# Patient Record
Sex: Male | Born: 1945 | Race: White | Hispanic: No | Marital: Married | Smoking: Never smoker
Health system: Southern US, Community
[De-identification: ages and names within clinical notes are randomized; demographics above are authoritative.]

## PROBLEM LIST (undated history)

## (undated) DIAGNOSIS — I472 Ventricular tachycardia, unspecified: Secondary | ICD-10-CM

## (undated) DIAGNOSIS — I2699 Other pulmonary embolism without acute cor pulmonale: Secondary | ICD-10-CM

## (undated) DIAGNOSIS — K5730 Diverticulosis of large intestine without perforation or abscess without bleeding: Secondary | ICD-10-CM

## (undated) DIAGNOSIS — Z95810 Presence of automatic (implantable) cardiac defibrillator: Secondary | ICD-10-CM

## (undated) DIAGNOSIS — Z950 Presence of cardiac pacemaker: Secondary | ICD-10-CM

## (undated) DIAGNOSIS — D649 Anemia, unspecified: Secondary | ICD-10-CM

## (undated) DIAGNOSIS — I499 Cardiac arrhythmia, unspecified: Secondary | ICD-10-CM

## (undated) DIAGNOSIS — I071 Rheumatic tricuspid insufficiency: Secondary | ICD-10-CM

## (undated) DIAGNOSIS — Z8739 Personal history of other diseases of the musculoskeletal system and connective tissue: Secondary | ICD-10-CM

## (undated) DIAGNOSIS — E785 Hyperlipidemia, unspecified: Secondary | ICD-10-CM

## (undated) DIAGNOSIS — Z978 Presence of other specified devices: Secondary | ICD-10-CM

## (undated) DIAGNOSIS — N183 Chronic kidney disease, stage 3 unspecified: Secondary | ICD-10-CM

## (undated) DIAGNOSIS — I442 Atrioventricular block, complete: Secondary | ICD-10-CM

## (undated) DIAGNOSIS — I4819 Other persistent atrial fibrillation: Secondary | ICD-10-CM

## (undated) DIAGNOSIS — F0280 Dementia in other diseases classified elsewhere without behavioral disturbance: Secondary | ICD-10-CM

## (undated) DIAGNOSIS — N138 Other obstructive and reflux uropathy: Secondary | ICD-10-CM

## (undated) DIAGNOSIS — C801 Malignant (primary) neoplasm, unspecified: Secondary | ICD-10-CM

## (undated) DIAGNOSIS — Z7901 Long term (current) use of anticoagulants: Secondary | ICD-10-CM

## (undated) HISTORY — DX: Diverticulosis of large intestine without perforation or abscess without bleeding: K57.30

## (undated) HISTORY — DX: Other pulmonary embolism without acute cor pulmonale: I26.99

## (undated) HISTORY — DX: Hyperlipidemia, unspecified: E78.5

## (undated) HISTORY — DX: Presence of automatic (implantable) cardiac defibrillator: Z95.810

## (undated) HISTORY — DX: Ventricular tachycardia: I47.2

## (undated) HISTORY — DX: Rheumatic tricuspid insufficiency: I07.1

## (undated) HISTORY — DX: Ventricular tachycardia, unspecified: I47.20

---
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
## 2004-01-23 ENCOUNTER — Encounter: Admission: RE | Admit: 2004-01-23 | Discharge: 2004-01-23 | Payer: Self-pay | Admitting: Specialist

## 2004-03-03 ENCOUNTER — Ambulatory Visit (HOSPITAL_COMMUNITY): Admission: RE | Admit: 2004-03-03 | Discharge: 2004-03-03 | Payer: Self-pay | Admitting: Specialist

## 2009-11-21 DIAGNOSIS — Z86711 Personal history of pulmonary embolism: Secondary | ICD-10-CM

## 2009-11-21 HISTORY — DX: Personal history of pulmonary embolism: Z86.711

## 2010-08-10 DIAGNOSIS — E785 Hyperlipidemia, unspecified: Secondary | ICD-10-CM | POA: Insufficient documentation

## 2010-11-21 HISTORY — PX: CARDIOVERSION: SHX1299

## 2010-12-12 ENCOUNTER — Encounter: Payer: Self-pay | Admitting: Specialist

## 2011-01-20 DIAGNOSIS — I495 Sick sinus syndrome: Secondary | ICD-10-CM

## 2011-01-20 HISTORY — DX: Sick sinus syndrome: I49.5

## 2011-01-20 HISTORY — PX: CARDIAC PACEMAKER PLACEMENT: SHX583

## 2011-01-31 HISTORY — PX: CARDIAC CATHETERIZATION: SHX172

## 2011-03-22 DIAGNOSIS — Z95810 Presence of automatic (implantable) cardiac defibrillator: Secondary | ICD-10-CM

## 2011-03-22 DIAGNOSIS — I4729 Other ventricular tachycardia: Secondary | ICD-10-CM

## 2011-03-22 HISTORY — DX: Other ventricular tachycardia: I47.29

## 2011-03-22 HISTORY — DX: Presence of automatic (implantable) cardiac defibrillator: Z95.810

## 2011-04-04 HISTORY — PX: EP IMPLANTABLE DEVICE: SHX172B

## 2011-04-13 DIAGNOSIS — I340 Nonrheumatic mitral (valve) insufficiency: Secondary | ICD-10-CM | POA: Insufficient documentation

## 2011-04-13 DIAGNOSIS — I495 Sick sinus syndrome: Secondary | ICD-10-CM | POA: Insufficient documentation

## 2011-04-13 DIAGNOSIS — I472 Ventricular tachycardia, unspecified: Secondary | ICD-10-CM | POA: Insufficient documentation

## 2011-05-17 DIAGNOSIS — Z95810 Presence of automatic (implantable) cardiac defibrillator: Secondary | ICD-10-CM | POA: Insufficient documentation

## 2011-05-17 DIAGNOSIS — I442 Atrioventricular block, complete: Secondary | ICD-10-CM | POA: Insufficient documentation

## 2013-05-01 DIAGNOSIS — Z86711 Personal history of pulmonary embolism: Secondary | ICD-10-CM | POA: Insufficient documentation

## 2013-09-23 ENCOUNTER — Telehealth: Payer: Self-pay | Admitting: *Deleted

## 2013-09-23 DIAGNOSIS — M722 Plantar fascial fibromatosis: Secondary | ICD-10-CM

## 2013-09-23 NOTE — Telephone Encounter (Signed)
Request to order second pair of orthotics.

## 2013-09-24 NOTE — Telephone Encounter (Signed)
Ordered pt 2nd pair of orthotics. Will call when they arrive.

## 2014-02-25 DIAGNOSIS — K5730 Diverticulosis of large intestine without perforation or abscess without bleeding: Secondary | ICD-10-CM | POA: Insufficient documentation

## 2014-05-27 DIAGNOSIS — G3184 Mild cognitive impairment, so stated: Secondary | ICD-10-CM | POA: Insufficient documentation

## 2014-06-03 DIAGNOSIS — I482 Chronic atrial fibrillation, unspecified: Secondary | ICD-10-CM | POA: Insufficient documentation

## 2014-09-12 DIAGNOSIS — M720 Palmar fascial fibromatosis [Dupuytren]: Secondary | ICD-10-CM | POA: Insufficient documentation

## 2014-10-21 ENCOUNTER — Telehealth: Payer: Self-pay | Admitting: *Deleted

## 2014-10-21 NOTE — Telephone Encounter (Signed)
Pt asked if his 2nd pr of orthotics have arrived.

## 2014-10-22 NOTE — Telephone Encounter (Signed)
Placed 2nd pair order with [facility] with [facility] delivery

## 2014-11-10 DIAGNOSIS — M722 Plantar fascial fibromatosis: Secondary | ICD-10-CM

## 2015-04-14 ENCOUNTER — Ambulatory Visit: Payer: Medicare Other | Admitting: Podiatry

## 2015-04-14 ENCOUNTER — Ambulatory Visit (INDEPENDENT_AMBULATORY_CARE_PROVIDER_SITE_OTHER): Payer: Managed Care, Other (non HMO) | Admitting: Podiatry

## 2015-04-14 ENCOUNTER — Encounter: Payer: Self-pay | Admitting: Podiatry

## 2015-04-14 ENCOUNTER — Ambulatory Visit (INDEPENDENT_AMBULATORY_CARE_PROVIDER_SITE_OTHER): Payer: Managed Care, Other (non HMO)

## 2015-04-14 VITALS — BP 133/66 | HR 60 | Resp 12

## 2015-04-14 DIAGNOSIS — M2042 Other hammer toe(s) (acquired), left foot: Secondary | ICD-10-CM

## 2015-04-14 DIAGNOSIS — M779 Enthesopathy, unspecified: Secondary | ICD-10-CM

## 2015-04-14 NOTE — Progress Notes (Signed)
   Subjective:    Patient ID: Eric Krueger, male    DOB: [birth year], 69 y.o.   MRN: [id number]  HPI  PT STATED LT BALL OF THE FOOT HAVING SHARP FOR 3 WEEKS. THE FOOT IS BEEN THE SAME BUT WHEN WORSE IN THE MORNING AND THROUGH OUT THE DAY. THE FOOT GET AGGRAVATED WHEN WALKING OR PUTTING PRESSURE ON IT. TRIED NO TREATMENT.  Review of Systems  Musculoskeletal: Positive for myalgias.       Objective:   Physical Exam: I have reviewed his past medical history medications allergies surgery social history and review of systems. Pulses are strongly palpable bilateral. Muscles are +5 over 5 dorsiflexion plantar flexors and inverters and everters all into the musculature is intact. 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 Radiographs confirm plantarflexed metatarsal with hammertoe deformity rigid in nature.        Assessment & Plan:  Assessment: Capsulitis second metatarsophalangeal joint left foot.  Plan: Injected around the second metatarsophalangeal joint with Kenalog and local and aesthetic after sterile Betadine skin prep. Follow up with him as needed. We discussed appropriate shoe gear stretching exercises ice therapy and shoe modifications.

## 2015-11-10 HISTORY — PX: ICD GENERATOR CHANGE: SHX5854

## 2015-11-27 DIAGNOSIS — R7309 Other abnormal glucose: Secondary | ICD-10-CM | POA: Insufficient documentation

## 2016-06-15 DIAGNOSIS — N401 Enlarged prostate with lower urinary tract symptoms: Secondary | ICD-10-CM | POA: Insufficient documentation

## 2016-06-29 DIAGNOSIS — Z1159 Encounter for screening for other viral diseases: Secondary | ICD-10-CM | POA: Insufficient documentation

## 2017-12-22 DIAGNOSIS — G894 Chronic pain syndrome: Secondary | ICD-10-CM | POA: Insufficient documentation

## 2018-04-09 DIAGNOSIS — R600 Localized edema: Secondary | ICD-10-CM | POA: Insufficient documentation

## 2018-04-10 DIAGNOSIS — Z8739 Personal history of other diseases of the musculoskeletal system and connective tissue: Secondary | ICD-10-CM | POA: Insufficient documentation

## 2018-06-27 DIAGNOSIS — M461 Sacroiliitis, not elsewhere classified: Secondary | ICD-10-CM | POA: Insufficient documentation

## 2018-06-27 DIAGNOSIS — M47816 Spondylosis without myelopathy or radiculopathy, lumbar region: Secondary | ICD-10-CM | POA: Insufficient documentation

## 2019-01-01 ENCOUNTER — Encounter: Payer: Self-pay | Admitting: Podiatry

## 2019-01-01 ENCOUNTER — Ambulatory Visit (INDEPENDENT_AMBULATORY_CARE_PROVIDER_SITE_OTHER): Payer: Medicare Other | Admitting: Podiatry

## 2019-01-01 ENCOUNTER — Ambulatory Visit: Payer: Managed Care, Other (non HMO)

## 2019-01-01 VITALS — BP 111/74 | HR 92 | Resp 16

## 2019-01-01 DIAGNOSIS — M779 Enthesopathy, unspecified: Principal | ICD-10-CM

## 2019-01-01 DIAGNOSIS — L600 Ingrowing nail: Secondary | ICD-10-CM

## 2019-01-01 DIAGNOSIS — M5136 Other intervertebral disc degeneration, lumbar region: Secondary | ICD-10-CM | POA: Insufficient documentation

## 2019-01-01 DIAGNOSIS — M778 Other enthesopathies, not elsewhere classified: Secondary | ICD-10-CM

## 2019-01-01 MED ORDER — NEOMYCIN-POLYMYXIN-HC 1 % OT SOLN: OTIC | 1 refills | Status: DC

## 2019-01-01 NOTE — Progress Notes (Signed)
Subjective:  Patient ID: Eric Krueger, male    DOB: 24-Aug-1946,  MRN: [id number] HPI Chief Complaint  Patient presents with  . Toe Pain    Hallux left - lateral border, injury to toe and nail has become ingrown, tender, red and swollen, using antibiotic ointment and bandaid  . New Patient (Initial Visit)    Est pt 2016    73 y.o. male presents with the above complaint.   ROS: Denies fever chills nausea vomiting muscle aches pains calf pain back pain chest pain shortness of breath.  Past Medical History:  Diagnosis Date  . Dual implantable cardioverter-defibrillator in situ   . Dyslipidemia   . Pulmonary embolism ([place])   . Sigmoid diverticulosis   . Tricuspid regurgitation   . Ventricular tachycardia ([place])    No past surgical history on file.  Current Outpatient Medications:  .  acetaminophen (TYLENOL) 500 MG tablet, Take by mouth., Disp: , Rfl:  .  cyanocobalamin ([employer] VITAMIN B12) 1000 MCG tablet, Take by mouth., Disp: , Rfl:  .  gabapentin (NEURONTIN) 300 MG capsule, TAKE 1 TO 2 CAPSULES EVERY EVENING BEFORE BEDTIME., Disp: , Rfl:  .  atorvastatin (LIPITOR) 10 MG tablet, , Disp: , Rfl:  .  ELIQUIS 5 MG TABS tablet, , Disp: , Rfl: 11 .  fenofibrate (TRICOR) 145 MG tablet, , Disp: , Rfl:  .  NEOMYCIN-POLYMYXIN-HYDROCORTISONE (CORTISPORIN) 1 % SOLN OTIC solution, Apply 1-2 drops to toe BID after soaking, Disp: 10 mL, Rfl: 1 .  sotalol (BETAPACE) 160 MG tablet, , Disp: , Rfl: 0  Allergies  Allergen Reactions  . Other     Other reaction(s): Redness  . Adhesive [Tape]   . Aspirin   . Ezetimibe-Simvastatin     myalgias  . Latex   . Nsaids     Other reaction(s): Other (See Comments) Cannot take d/t defibrillator/pacemaker  . Saw Palmetto Diarrhea   Review of Systems Objective:   Vitals:   01/01/19 1118  BP: 111/74  Pulse: 92  Resp: 16    General: Well developed, nourished, in no acute distress, alert and oriented x3   Dermatological: Skin is warm, dry and  supple bilateral. Nails x 10 are well maintained; remaining integument appears unremarkable at this time. There are no open sores, no preulcerative lesions, no rash or signs of infection present.  Sharp incurvated nail margin fibular border hallux left with exquisite tenderness on palpation.  Mild erythema mild edema no cellulitis drainage or odor.  Vascular: Dorsalis Pedis artery and Posterior Tibial artery pedal pulses are 2/4 bilateral with immedate capillary fill time. Pedal hair growth present. No varicosities and no lower extremity edema present bilateral.   Neruologic: Grossly intact via light touch bilateral. Vibratory intact via tuning fork bilateral. Protective threshold with Semmes Wienstein monofilament intact to all pedal sites bilateral. Patellar and Achilles deep tendon reflexes 2+ bilateral. No Babinski or clonus noted bilateral.   Musculoskeletal: No gross boney pedal deformities bilateral. No pain, crepitus, or limitation noted with foot and ankle range of motion bilateral. Muscular strength 5/5 in all groups tested bilateral.  Gait: Unassisted, Nonantalgic.    Radiographs:  None taken  Assessment & Plan:   Assessment: Ingrown toenail fibular border hallux left  Plan: Chemical matricectomy was performed after local anesthesia was administered.  Tolerated procedure well without complications.  Was provided with both oral and written home-going instructions for the care and soaking of his toe as well as a prescription for Corticosporin otic to be applied  twice daily after soaking.      T. [place], [place]

## 2019-01-01 NOTE — Patient Instructions (Signed)

## 2019-01-22 ENCOUNTER — Ambulatory Visit (INDEPENDENT_AMBULATORY_CARE_PROVIDER_SITE_OTHER): Payer: Self-pay

## 2019-01-22 ENCOUNTER — Other Ambulatory Visit: Payer: Medicare Other

## 2019-01-22 DIAGNOSIS — L600 Ingrowing nail: Secondary | ICD-10-CM

## 2019-01-22 NOTE — Patient Instructions (Signed)
Please continue to soak until there is no pain, no redness and no drainage. Bandage on during the day and off at night. Report any signs of symptoms of infection, examples are: increased pain, increased swelling or drainage, increased redness. Please call with any questions or concerns

## 2019-01-22 NOTE — Progress Notes (Signed)
Patient is here today for follow-up appointment, recent procedure performed 01/01/2019, removal of ingrown left hallux nail border.  Patient states that the area is healing well and is not having any issues at this time with it.  No redness, no swelling, no erythema, no drainage, no other signs and symptoms of infection.  Area is scabbed over and is healing well at this time.  Discussed signs and symptoms of infection with patient verbal and written instructions were given.  He is to follow-up as needed with any acute symptom changes.

## 2019-11-22 HISTORY — PX: CATARACT EXTRACTION W/ INTRAOCULAR LENS IMPLANT: SHX1309

## 2021-06-21 DIAGNOSIS — R339 Retention of urine, unspecified: Secondary | ICD-10-CM

## 2021-06-21 HISTORY — DX: Retention of urine, unspecified: R33.9

## 2021-07-04 ENCOUNTER — Inpatient Hospital Stay (HOSPITAL_COMMUNITY): Payer: Medicare Other

## 2021-07-04 ENCOUNTER — Inpatient Hospital Stay (HOSPITAL_COMMUNITY)
Admission: EM | Admit: 2021-07-04 | Discharge: 2021-07-08 | DRG: 682 | Disposition: A | Discharge status: Home Health | Payer: Medicare Other | Attending: Internal Medicine | Admitting: Internal Medicine

## 2021-07-04 ENCOUNTER — Other Ambulatory Visit: Payer: Self-pay

## 2021-07-04 ENCOUNTER — Emergency Department (HOSPITAL_COMMUNITY): Payer: Medicare Other

## 2021-07-04 ENCOUNTER — Encounter (HOSPITAL_COMMUNITY): Payer: Self-pay | Admitting: Emergency Medicine

## 2021-07-04 DIAGNOSIS — Z7901 Long term (current) use of anticoagulants: Secondary | ICD-10-CM

## 2021-07-04 DIAGNOSIS — I071 Rheumatic tricuspid insufficiency: Secondary | ICD-10-CM | POA: Diagnosis present

## 2021-07-04 DIAGNOSIS — G9341 Metabolic encephalopathy: Secondary | ICD-10-CM | POA: Diagnosis present

## 2021-07-04 DIAGNOSIS — N1330 Unspecified hydronephrosis: Secondary | ICD-10-CM | POA: Diagnosis present

## 2021-07-04 DIAGNOSIS — Z20822 Contact with and (suspected) exposure to covid-19: Secondary | ICD-10-CM | POA: Diagnosis present

## 2021-07-04 DIAGNOSIS — N138 Other obstructive and reflux uropathy: Secondary | ICD-10-CM | POA: Diagnosis present

## 2021-07-04 DIAGNOSIS — N19 Unspecified kidney failure: Secondary | ICD-10-CM

## 2021-07-04 DIAGNOSIS — N179 Acute kidney failure, unspecified: Secondary | ICD-10-CM | POA: Diagnosis present

## 2021-07-04 DIAGNOSIS — Z833 Family history of diabetes mellitus: Secondary | ICD-10-CM

## 2021-07-04 DIAGNOSIS — N1831 Chronic kidney disease, stage 3a: Secondary | ICD-10-CM | POA: Diagnosis present

## 2021-07-04 DIAGNOSIS — E872 Acidosis: Secondary | ICD-10-CM | POA: Diagnosis present

## 2021-07-04 DIAGNOSIS — I472 Ventricular tachycardia: Secondary | ICD-10-CM | POA: Diagnosis present

## 2021-07-04 DIAGNOSIS — I495 Sick sinus syndrome: Secondary | ICD-10-CM | POA: Diagnosis present

## 2021-07-04 DIAGNOSIS — R680 Hypothermia, not associated with low environmental temperature: Secondary | ICD-10-CM | POA: Diagnosis present

## 2021-07-04 DIAGNOSIS — Z86711 Personal history of pulmonary embolism: Secondary | ICD-10-CM

## 2021-07-04 DIAGNOSIS — E876 Hypokalemia: Secondary | ICD-10-CM | POA: Diagnosis present

## 2021-07-04 DIAGNOSIS — R319 Hematuria, unspecified: Secondary | ICD-10-CM | POA: Diagnosis not present

## 2021-07-04 DIAGNOSIS — R3581 Nocturnal polyuria: Secondary | ICD-10-CM | POA: Diagnosis not present

## 2021-07-04 DIAGNOSIS — I482 Chronic atrial fibrillation, unspecified: Secondary | ICD-10-CM | POA: Diagnosis present

## 2021-07-04 DIAGNOSIS — N178 Other acute kidney failure: Secondary | ICD-10-CM | POA: Diagnosis not present

## 2021-07-04 DIAGNOSIS — E785 Hyperlipidemia, unspecified: Secondary | ICD-10-CM | POA: Diagnosis present

## 2021-07-04 DIAGNOSIS — R627 Adult failure to thrive: Secondary | ICD-10-CM | POA: Diagnosis present

## 2021-07-04 DIAGNOSIS — Z95810 Presence of automatic (implantable) cardiac defibrillator: Secondary | ICD-10-CM

## 2021-07-04 DIAGNOSIS — N401 Enlarged prostate with lower urinary tract symptoms: Secondary | ICD-10-CM | POA: Diagnosis present

## 2021-07-04 DIAGNOSIS — N3941 Urge incontinence: Secondary | ICD-10-CM | POA: Diagnosis present

## 2021-07-04 DIAGNOSIS — E875 Hyperkalemia: Secondary | ICD-10-CM | POA: Diagnosis present

## 2021-07-04 DIAGNOSIS — T68XXXA Hypothermia, initial encounter: Secondary | ICD-10-CM | POA: Diagnosis not present

## 2021-07-04 DIAGNOSIS — Z7189 Other specified counseling: Secondary | ICD-10-CM

## 2021-07-04 DIAGNOSIS — G309 Alzheimer's disease, unspecified: Secondary | ICD-10-CM | POA: Diagnosis present

## 2021-07-04 DIAGNOSIS — D631 Anemia in chronic kidney disease: Secondary | ICD-10-CM | POA: Diagnosis present

## 2021-07-04 DIAGNOSIS — E870 Hyperosmolality and hypernatremia: Secondary | ICD-10-CM | POA: Diagnosis not present

## 2021-07-04 DIAGNOSIS — I959 Hypotension, unspecified: Secondary | ICD-10-CM | POA: Diagnosis present

## 2021-07-04 DIAGNOSIS — R296 Repeated falls: Secondary | ICD-10-CM | POA: Diagnosis present

## 2021-07-04 DIAGNOSIS — F0280 Dementia in other diseases classified elsewhere without behavioral disturbance: Secondary | ICD-10-CM | POA: Diagnosis present

## 2021-07-04 DIAGNOSIS — I442 Atrioventricular block, complete: Secondary | ICD-10-CM | POA: Diagnosis present

## 2021-07-04 DIAGNOSIS — Z515 Encounter for palliative care: Secondary | ICD-10-CM | POA: Diagnosis not present

## 2021-07-04 DIAGNOSIS — Z886 Allergy status to analgesic agent status: Secondary | ICD-10-CM

## 2021-07-04 DIAGNOSIS — R7989 Other specified abnormal findings of blood chemistry: Secondary | ICD-10-CM | POA: Diagnosis present

## 2021-07-04 DIAGNOSIS — Z888 Allergy status to other drugs, medicaments and biological substances status: Secondary | ICD-10-CM

## 2021-07-04 DIAGNOSIS — Z79899 Other long term (current) drug therapy: Secondary | ICD-10-CM

## 2021-07-04 HISTORY — DX: Cardiac arrhythmia, unspecified: I49.9

## 2021-07-04 HISTORY — DX: Presence of cardiac pacemaker: Z95.0

## 2021-07-04 HISTORY — DX: Presence of automatic (implantable) cardiac defibrillator: Z95.810

## 2021-07-04 LAB — CBC WITH DIFFERENTIAL/PLATELET
Abs Immature Granulocytes: 0.02 10*3/uL (ref 0.00–0.07)
Basophils Absolute: 0 10*3/uL (ref 0.0–0.1)
Basophils Relative: 1 %
Eosinophils Absolute: 0.1 10*3/uL (ref 0.0–0.5)
Eosinophils Relative: 2 %
HCT: 37.9 % — ABNORMAL LOW (ref 39.0–52.0)
Hemoglobin: 12.4 g/dL — ABNORMAL LOW (ref 13.0–17.0)
Immature Granulocytes: 0 %
Lymphocytes Relative: 8 %
Lymphs Abs: 0.5 10*3/uL — ABNORMAL LOW (ref 0.7–4.0)
MCH: 29.7 pg (ref 26.0–34.0)
MCHC: 32.7 g/dL (ref 30.0–36.0)
MCV: 90.9 fL (ref 80.0–100.0)
Monocytes Absolute: 0.4 10*3/uL (ref 0.1–1.0)
Monocytes Relative: 6 %
Neutro Abs: 5.4 10*3/uL (ref 1.7–7.7)
Neutrophils Relative %: 83 %
Platelets: 191 10*3/uL (ref 150–400)
RBC: 4.17 MIL/uL — ABNORMAL LOW (ref 4.22–5.81)
RDW: 16.4 % — ABNORMAL HIGH (ref 11.5–15.5)
WBC: 6.4 10*3/uL (ref 4.0–10.5)
nRBC: 0 % (ref 0.0–0.2)

## 2021-07-04 LAB — URINALYSIS, ROUTINE W REFLEX MICROSCOPIC
Bilirubin Urine: NEGATIVE
Glucose, UA: NEGATIVE mg/dL
Ketones, ur: NEGATIVE mg/dL
Leukocytes,Ua: NEGATIVE
Nitrite: NEGATIVE
Protein, ur: 100 mg/dL — AB
Specific Gravity, Urine: 1.01 (ref 1.005–1.030)
pH: 6 (ref 5.0–8.0)

## 2021-07-04 LAB — CBG MONITORING, ED
Glucose-Capillary: 114 mg/dL — ABNORMAL HIGH (ref 70–99)
Glucose-Capillary: 178 mg/dL — ABNORMAL HIGH (ref 70–99)
Glucose-Capillary: 44 mg/dL — CL (ref 70–99)
Glucose-Capillary: 87 mg/dL (ref 70–99)
Glucose-Capillary: 67 mg/dL — ABNORMAL LOW (ref 70–99)
Glucose-Capillary: 97 mg/dL (ref 70–99)
Glucose-Capillary: 108 mg/dL — ABNORMAL HIGH (ref 70–99)

## 2021-07-04 LAB — URINALYSIS, MICROSCOPIC (REFLEX): RBC / HPF: >50 RBC/hpf (ref 0–5)

## 2021-07-04 LAB — BASIC METABOLIC PANEL
Anion gap: 12 (ref 5–15)
Anion gap: 12 (ref 5–15)
BUN: 178 mg/dL — ABNORMAL HIGH (ref 8–23)
BUN: 176 mg/dL — ABNORMAL HIGH (ref 8–23)
CO2: 16 mmol/L — ABNORMAL LOW (ref 22–32)
CO2: 18 mmol/L — ABNORMAL LOW (ref 22–32)
Calcium: 8.4 mg/dL — ABNORMAL LOW (ref 8.9–10.3)
Calcium: 9 mg/dL (ref 8.9–10.3)
Chloride: 116 mmol/L — ABNORMAL HIGH (ref 98–111)
Chloride: 117 mmol/L — ABNORMAL HIGH (ref 98–111)
Creatinine, Ser: 9.51 mg/dL — ABNORMAL HIGH (ref 0.61–1.24)
Creatinine, Ser: 8.97 mg/dL — ABNORMAL HIGH (ref 0.61–1.24)
GFR, Estimated: 5 mL/min — ABNORMAL LOW (ref >60)
GFR, Estimated: 6 mL/min — ABNORMAL LOW (ref >60)
Glucose, Bld: 133 mg/dL — ABNORMAL HIGH (ref 70–99)
Glucose, Bld: 108 mg/dL — ABNORMAL HIGH (ref 70–99)
Potassium: 5 mmol/L (ref 3.5–5.1)
Potassium: 5 mmol/L (ref 3.5–5.1)
Sodium: 144 mmol/L (ref 135–145)
Sodium: 147 mmol/L — ABNORMAL HIGH (ref 135–145)

## 2021-07-04 LAB — I-STAT CHEM 8, ED
BUN: >130 mg/dL — ABNORMAL HIGH (ref 8–23)
Calcium, Ion: 1.07 mmol/L — ABNORMAL LOW (ref 1.15–1.40)
Chloride: 113 mmol/L — ABNORMAL HIGH (ref 98–111)
Creatinine, Ser: 13.2 mg/dL — ABNORMAL HIGH (ref 0.61–1.24)
Glucose, Bld: 66 mg/dL — ABNORMAL LOW (ref 70–99)
HCT: 30 % — ABNORMAL LOW (ref 39.0–52.0)
Hemoglobin: 10.2 g/dL — ABNORMAL LOW (ref 13.0–17.0)
Potassium: 6.4 mmol/L (ref 3.5–5.1)
Sodium: 141 mmol/L (ref 135–145)
TCO2: 14 mmol/L — ABNORMAL LOW (ref 22–32)

## 2021-07-04 LAB — COMPREHENSIVE METABOLIC PANEL
ALT: 80 U/L — ABNORMAL HIGH (ref 0–44)
AST: 77 U/L — ABNORMAL HIGH (ref 15–41)
Albumin: 3 g/dL — ABNORMAL LOW (ref 3.5–5.0)
Alkaline Phosphatase: 35 U/L — ABNORMAL LOW (ref 38–126)
Anion gap: 14 (ref 5–15)
BUN: 257 mg/dL — ABNORMAL HIGH (ref 8–23)
CO2: 14 mmol/L — ABNORMAL LOW (ref 22–32)
Calcium: 9 mg/dL (ref 8.9–10.3)
Chloride: 111 mmol/L (ref 98–111)
Creatinine, Ser: 12.66 mg/dL — ABNORMAL HIGH (ref 0.61–1.24)
GFR, Estimated: 4 mL/min — ABNORMAL LOW (ref >60)
Glucose, Bld: 141 mg/dL — ABNORMAL HIGH (ref 70–99)
Potassium: >6.2 mmol/L (ref 3.5–5.1)
Sodium: 139 mmol/L (ref 135–145)
Total Bilirubin: 1.3 mg/dL — ABNORMAL HIGH (ref 0.3–1.2)
Total Protein: 6.2 g/dL — ABNORMAL LOW (ref 6.5–8.1)

## 2021-07-04 LAB — APTT: aPTT: 43 seconds — ABNORMAL HIGH (ref 24–36)

## 2021-07-04 LAB — HEPARIN LEVEL (UNFRACTIONATED): Heparin Unfractionated: >1.1 IU/mL — ABNORMAL HIGH (ref 0.30–0.70)

## 2021-07-04 LAB — RESP PANEL BY RT-PCR (FLU A&B, COVID) ARPGX2
Influenza A by PCR: NEGATIVE
Influenza B by PCR: NEGATIVE
SARS Coronavirus 2 by RT PCR: NEGATIVE

## 2021-07-04 LAB — CORTISOL: Cortisol, Plasma: 35.3 ug/dL

## 2021-07-04 LAB — LACTIC ACID, PLASMA
Lactic Acid, Venous: 1.2 mmol/L (ref 0.5–1.9)
Lactic Acid, Venous: 1.7 mmol/L (ref 0.5–1.9)

## 2021-07-04 LAB — SODIUM, URINE, RANDOM: Sodium, Ur: 37 mmol/L

## 2021-07-04 LAB — TSH: TSH: 4.076 u[IU]/mL (ref 0.350–4.500)

## 2021-07-04 LAB — CREATININE, URINE, RANDOM: Creatinine, Urine: 59.6 mg/dL

## 2021-07-04 LAB — PROTIME-INR
INR: 3.7 — ABNORMAL HIGH (ref 0.8–1.2)
Prothrombin Time: 36.3 seconds — ABNORMAL HIGH (ref 11.4–15.2)

## 2021-07-04 MED ORDER — SODIUM BICARBONATE 8.4 % IV SOLN
INTRAVENOUS | Status: DC
  Filled 2021-07-04 (×3): qty 1000

## 2021-07-04 MED ORDER — DEXTROSE 50 % IV SOLN
50.0000 mL | Freq: Once | INTRAVENOUS | Status: AC
  Administered 2021-07-04: 50 mL via INTRAVENOUS
  Filled 2021-07-04: qty 50

## 2021-07-04 MED ORDER — LACTATED RINGERS IV BOLUS
2000.0000 mL | Freq: Once | INTRAVENOUS | Status: AC
  Administered 2021-07-04: 2000 mL via INTRAVENOUS

## 2021-07-04 MED ORDER — SODIUM CHLORIDE 0.9 % IV SOLN
1.0000 g | Freq: Every day | INTRAVENOUS | Status: DC
  Administered 2021-07-04 – 2021-07-05 (×2): 1 g via INTRAVENOUS
  Filled 2021-07-04 (×3): qty 1

## 2021-07-04 MED ORDER — HALOPERIDOL LACTATE 5 MG/ML IJ SOLN
1.0000 mg | Freq: Four times a day (QID) | INTRAMUSCULAR | Status: DC | PRN
  Administered 2021-07-05 – 2021-07-07 (×3): 1 mg via INTRAVENOUS
  Filled 2021-07-04 (×3): qty 1

## 2021-07-04 MED ORDER — SODIUM CHLORIDE 0.9 % IV SOLN
2.0000 g | Freq: Once | INTRAVENOUS | Status: AC
  Administered 2021-07-04: 2 g via INTRAVENOUS
  Filled 2021-07-04: qty 20

## 2021-07-04 MED ORDER — DEXTROSE 50 % IV SOLN
1.0000 | Freq: Once | INTRAVENOUS | Status: AC
  Administered 2021-07-04: 50 mL via INTRAVENOUS
  Filled 2021-07-04: qty 50

## 2021-07-04 MED ORDER — ACETAMINOPHEN 650 MG RE SUPP: 650.0000 mg | Freq: Four times a day (QID) | RECTAL | Status: DC | PRN

## 2021-07-04 MED ORDER — SODIUM CHLORIDE 0.9 % IV BOLUS
500.0000 mL | Freq: Once | INTRAVENOUS | Status: AC
  Administered 2021-07-04: 500 mL via INTRAVENOUS

## 2021-07-04 MED ORDER — CALCIUM GLUCONATE-NACL 1-0.675 GM/50ML-% IV SOLN
1.0000 g | Freq: Once | INTRAVENOUS | Status: AC
  Administered 2021-07-04: 1000 mg via INTRAVENOUS
  Filled 2021-07-04: qty 50

## 2021-07-04 MED ORDER — SODIUM ZIRCONIUM CYCLOSILICATE 10 G PO PACK
10.0000 g | PACK | Freq: Once | ORAL | Status: AC
  Administered 2021-07-04: 10 g via ORAL
  Filled 2021-07-04: qty 1

## 2021-07-04 MED ORDER — SODIUM BICARBONATE 8.4 % IV SOLN
50.0000 meq | Freq: Once | INTRAVENOUS | Status: AC
  Administered 2021-07-04: 50 meq via INTRAVENOUS
  Filled 2021-07-04: qty 50

## 2021-07-04 MED ORDER — ACETAMINOPHEN 325 MG PO TABS: 650.0000 mg | ORAL_TABLET | Freq: Four times a day (QID) | ORAL | Status: DC | PRN

## 2021-07-04 MED ORDER — INSULIN ASPART 100 UNIT/ML IV SOLN
5.0000 [IU] | Freq: Once | INTRAVENOUS | Status: AC
  Administered 2021-07-04: 5 [IU] via INTRAVENOUS
  Filled 2021-07-04: qty 0.05

## 2021-07-04 MED ORDER — SODIUM CHLORIDE 0.9 % IV BOLUS: 2000.0000 mL | Freq: Once | INTRAVENOUS | Status: DC

## 2021-07-04 MED ORDER — SODIUM ZIRCONIUM CYCLOSILICATE 10 G PO PACK
10.0000 g | PACK | Freq: Three times a day (TID) | ORAL | Status: DC
  Administered 2021-07-04: 10 g via ORAL
  Filled 2021-07-04 (×2): qty 1

## 2021-07-04 MED ORDER — SODIUM CHLORIDE 0.9 % IV BOLUS: 500.0000 mL | Freq: Once | INTRAVENOUS | Status: DC

## 2021-07-04 MED ORDER — DEXTROSE 50 % IV SOLN: 1.0000 | Freq: Once | INTRAVENOUS | Status: AC

## 2021-07-04 MED ORDER — LACTATED RINGERS IV SOLN: INTRAVENOUS | Status: DC

## 2021-07-04 MED ORDER — DEXTROSE 50 % IV SOLN
INTRAVENOUS | Status: AC
  Administered 2021-07-04: 50 mL via INTRAVENOUS
  Filled 2021-07-04: qty 50

## 2021-07-04 MED ORDER — SODIUM CHLORIDE 0.9 % IV SOLN
Freq: Once | INTRAVENOUS | Status: AC
  Administered 2021-07-04: 125 mL/h via INTRAVENOUS

## 2021-07-04 MED ORDER — SODIUM CHLORIDE 0.45 % IV SOLN
INTRAVENOUS | Status: DC
  Filled 2021-07-04: qty 75

## 2021-07-04 MED ORDER — HEPARIN (PORCINE) 25000 UT/250ML-% IV SOLN
1100.0000 [IU]/h | INTRAVENOUS | Status: DC
  Filled 2021-07-04: qty 250

## 2021-07-04 NOTE — ED Provider Notes (Signed)
[facility] Emergency Department Provider Note MRN:  [id number]  Arrival date & time: 07/04/21     Chief Complaint   Weakness and Failure To Thrive   History of Present Illness   Eric Krueger is a 75 y.o. year-old male with a history of PE, heart block status post pacemaker presenting to the ED with chief complaint of weakness.  Worsening weakness and confusion for the past couple weeks, spouse unable to care for him at home in this state.  I was unable to obtain an accurate HPI, PMH, or ROS due to the patient's altered mental status.  Level 5 caveat.  Review of Systems  Positive for altered mental status, weakness.  Patient's Health History    Past Medical History:  Diagnosis Date   AICD (automatic cardioverter/defibrillator) present    Dual implantable cardioverter-defibrillator in situ    Dyslipidemia    Dysrhythmia    Presence of permanent cardiac pacemaker    Pulmonary embolism (HCC)    Sigmoid diverticulosis    Tricuspid regurgitation    Ventricular tachycardia ([place])     History reviewed. No pertinent surgical history.  Family History  Problem Relation Age of Onset   Diabetes Mellitus II Other     Social History   Socioeconomic History   Marital status: Married    Spouse name: Not on file   Number of children: Not on file   Years of education: Not on file   Highest education level: Not on file  Occupational History   Not on file  Tobacco Use   Smoking status: Never   Smokeless tobacco: Never  Substance and Sexual Activity   Alcohol use: Not Currently   Drug use: No   Sexual activity: Not on file  Other Topics Concern   Not on file  Social History Narrative   Not on file   Social Determinants of Health   Financial Resource Strain: Not on file  Food Insecurity: Not on file  Transportation Needs: Not on file  Physical Activity: Not on file  Stress: Not on file  Social Connections: Not on file  Intimate Partner  Violence: Not on file     Physical Exam   Vitals:   07/04/21 0530 07/04/21 0545  BP: 91/64 96/64  Pulse: 60 61  Resp: 20 20  Temp:    SpO2: 97% 98%    CONSTITUTIONAL: Chronically ill-appearing, NAD NEURO: Somnolent but wakes to voice, oriented to name, moves all extremities EYES:  eyes equal and reactive ENT/NECK:  no LAD, no JVD CARDIO: Regular rate, well-perfused, normal S1 and S2 PULM:  CTAB no wheezing or rhonchi GI/GU:  normal bowel sounds, non-distended, non-tender MSK/SPINE:  No gross deformities, no edema SKIN:  no rash, atraumatic PSYCH:  Appropriate speech and behavior  *Additional and/or pertinent findings included in MDM below  Diagnostic and Interventional Summary    EKG Interpretation  Date/Time:  Sunday July 04 2021 01:35:09 EDT Ventricular Rate:  136 PR Interval:    QRS Duration: 213 QT Interval:    QTC Calculation:   R Axis:   -84 Text Interpretation: Atrial fibrillation Ventricular bigeminy Nonspecific IVCD with LAD LVH with secondary repolarization abnormality No previous ECGs available Confirmed by Gerlene Fee [phone]) on 07/04/2021 2:04:17 AM       Labs Reviewed  COMPREHENSIVE METABOLIC PANEL - Abnormal; Notable for the following components:      Result Value   Potassium >6.2 (*)    CO2 14 (*)    Glucose,  Bld 141 (*)    BUN 257 (*)    Creatinine, Ser 12.66 (*)    Total Protein 6.2 (*)    Albumin 3.0 (*)    AST 77 (*)    ALT 80 (*)    Alkaline Phosphatase 35 (*)    Total Bilirubin 1.3 (*)    GFR, Estimated 4 (*)    All other components within normal limits  CBC WITH DIFFERENTIAL/PLATELET - Abnormal; Notable for the following components:   RBC 4.17 (*)    Hemoglobin 12.4 (*)    HCT 37.9 (*)    RDW 16.4 (*)    Lymphs Abs 0.5 (*)    All other components within normal limits  PROTIME-INR - Abnormal; Notable for the following components:   Prothrombin Time 36.3 (*)    INR 3.7 (*)    All other components within normal limits  APTT -  Abnormal; Notable for the following components:   aPTT 43 (*)    All other components within normal limits  CBG MONITORING, ED - Abnormal; Notable for the following components:   Glucose-Capillary 114 (*)    All other components within normal limits  I-STAT CHEM 8, ED - Abnormal; Notable for the following components:   Potassium 6.4 (*)    Chloride 113 (*)    BUN >130 (*)    Creatinine, Ser 13.20 (*)    Glucose, Bld 66 (*)    Calcium, Ion 1.07 (*)    TCO2 14 (*)    Hemoglobin 10.2 (*)    HCT 30.0 (*)    All other components within normal limits  RESP PANEL BY RT-PCR (FLU A&B, COVID) ARPGX2  CULTURE, BLOOD (ROUTINE X 2)  CULTURE, BLOOD (ROUTINE X 2)  URINE CULTURE  LACTIC ACID, PLASMA  URINALYSIS, ROUTINE W REFLEX MICROSCOPIC  LACTIC ACID, PLASMA  CORTISOL  SODIUM, URINE, RANDOM  CREATININE, URINE, RANDOM  HEPARIN LEVEL (UNFRACTIONATED)    DG Chest Port 1 View  Final Result    US RENAL    (Results Pending)    Medications  calcium gluconate 1 g/ 50 mL sodium chloride IVPB (1,000 mg Intravenous New Bag/Given 07/04/21 0550)  sodium bicarbonate 75 mEq in sodium chloride 0.45 % 1,075 mL infusion ( Intravenous New Bag/Given 07/04/21 0553)  acetaminophen (TYLENOL) tablet 650 mg (has no administration in time range)    Or  acetaminophen (TYLENOL) suppository 650 mg (has no administration in time range)  cefTRIAXone (ROCEPHIN) 2 g in sodium chloride 0.9 % 100 mL IVPB (0 g Intravenous Stopped 07/04/21 0403)  lactated ringers bolus 2,000 mL (0 mLs Intravenous Stopped 07/04/21 0404)  sodium bicarbonate injection 50 mEq (50 mEq Intravenous Given 07/04/21 0405)  insulin aspart (novoLOG) injection 5 Units (5 Units Intravenous Given 07/04/21 0402)  dextrose 50 % solution 50 mL (50 mLs Intravenous Given 07/04/21 0404)  0.9 %  sodium chloride infusion ( Intravenous Stopped 07/04/21 0542)     Procedures  /  Critical Care .Critical Care  Date/Time: 07/04/2021 2:04 AM Performed by: Maudie Flakes, MD Authorized by: Maudie Flakes, MD   Critical care provider statement:    Critical care time (minutes):  48   Critical care was necessary to treat or prevent imminent or life-threatening deterioration of the following conditions: Acute renal failure.   Critical care was time spent personally by me on the following activities:  Discussions with consultants, evaluation of patient's response to treatment, examination of patient, ordering and performing treatments and interventions, ordering and  review of laboratory studies, ordering and review of radiographic studies, pulse oximetry, re-evaluation of patient's condition, obtaining history from patient or surrogate and review of old charts  ED Course and Medical Decision Making  I have reviewed the triage vital signs, the nursing notes, and pertinent available records from the EMR.  Listed above are laboratory and imaging tests that I personally ordered, reviewed, and interpreted and then considered in my medical decision making (see below for details).  Patient arrives hypothermic, hypotensive, report of weakness, failure to thrive, confusion.  Initial concern for possible sepsis, potentially septic shock given the blood pressure.  Received 500 cc crystalloid with EMS, will initiate code sepsis protocol providing further fluids, antibiotics, work-up is pending.     Work-up reveals acute renal failure, patient provided with calcium, insulin, D50, bicarb in response to the hyperkalemia.  Providing IV fluids and blood pressure seems to be stabilizing in the 90s.  Critical care cannot evaluate the patient, felt patient is appropriate for stepdown bed.  Discussed case with Dr. Burnett Sheng, who will follow patient in consultation.  Admitted to [facility] service for further care.  6:30 AM update: Trouble getting a bed at [facility] at the request of hospitalist service we have requested ED to ED transfer.  Accepted by Dr. Christy Gentles at the [facility] emergency department.  Barth Kirks. [place]onia Small, [place] [email or web address]  Final Clinical Impressions(s) / ED Diagnoses     ICD-10-CM   1. Acute renal failure, unspecified acute renal failure type ([place])  N17.9     2. ARF (acute renal failure) ([place])  N17.9 US RENAL    US RENAL      ED Discharge Orders     None        Discharge Instructions Discussed with and Provided to Patient:   Discharge Instructions   None       Maudie Flakes, MD 07/04/21 [phone]

## 2021-07-04 NOTE — Progress Notes (Signed)
  PROGRESS NOTE  Patient admitted earlier this morning. See H&P.   Patient admitted with decreased PO intake, confusion, has hx of dementia and lives at home with wife. Wife states that patient was having urinary incontinence issues in June, started on flomax and incontinence improved. However, patient continued to have poor PO intake, and last night became confused, thinking it was daytime. He fell at home and wife brought him to hospital.   Work up revealed hyperkalemia K 6.4, AKI with Cr 13.2.  On exam, patient is lethargic but arousable, waves hello. Patient had not received lokelma yet, I've asked RN to give asap. [facility] here to transfer [facility] to [facility] ED for ED to ED transfer. Nephrology has been consulted at time of admission.   Discussed with wife at bedside.   Status is: Inpatient  Remains inpatient appropriate because:Persistent severe electrolyte disturbances, Altered mental status, IV treatments appropriate due to intensity of illness or inability to take PO, and Inpatient level of care appropriate due to severity of illness  Dispo: The patient is from: Home              Anticipated d/c is to: Home              Patient currently is not medically stable to d/c.   Difficult to place patient No   Dessa Phi, DO [facility] 07/04/2021, 7:51 AM  Available via Epic secure chat 7am-7pm After these hours, please refer to coverage provider listed on [email or web address]

## 2021-07-04 NOTE — ED Notes (Signed)
Admitting provider paged regarding pts BP

## 2021-07-04 NOTE — Progress Notes (Signed)
ANTICOAGULATION CONSULT NOTE - Initial Consult  Pharmacy Consult for heparin Indication: atrial fibrillation  Allergies  Allergen Reactions   Naproxen Sodium     Other reaction(s): Other (See Comments) Does not take per cardiologist   Other     Other reaction(s): Redness Other reaction(s): Other (See Comments) Other reaction(s): Redness   Adhesive [Tape]    Aspirin     Other reaction(s): Other (See Comments)   Ezetimibe-Simvastatin     myalgias Other reaction(s): Other (See Comments) myalgias   Latex     Other reaction(s): Other (See Comments)   Nsaids     Other reaction(s): Other (See Comments) Cannot take d/t defibrillator/pacemaker   Pedi-Pre Tape Spray [Wound Dressing Adhesive]     Other reaction(s): Other (See Comments)   Saw Palmetto Diarrhea    Patient Measurements: Height: '6\' 2"'$  (188 cm) Weight: 74.8 kg (165 lb) IBW/kg (Calculated) : 82.2 Heparin Dosing Weight:   Vital Signs: Temp: 98.1 F (36.7 C) (08/14 0845) Temp Source: Axillary (08/14 0845) BP: 85/52 (08/14 0845) Pulse Rate: 60 (08/14 0845)  Labs: Recent Labs    07/04/21 0205 07/04/21 0557  HGB 12.4* 10.2*  HCT 37.9* 30.0*  PLT 191  --   APTT 43*  --   LABPROT 36.3*  --   INR 3.7*  --   HEPARINUNFRC >1.10*  --   CREATININE 12.66* 13.20*    Estimated Creatinine Clearance: 5 mL/min (A) (by C-G formula based on SCr of 13.2 mg/dL (H)).   Medical History: Past Medical History:  Diagnosis Date   AICD (automatic cardioverter/defibrillator) present    Dual implantable cardioverter-defibrillator in situ    Dyslipidemia    Dysrhythmia    Presence of permanent cardiac pacemaker    Pulmonary embolism (HCC)    Sigmoid diverticulosis    Tricuspid regurgitation    Ventricular tachycardia (HCC)     Medications:     Assessment: Pharmacy is consulted to dose heparin drip in 75 yo male with PMH of atrial fibrillation. Recent dispense history at pharmacy shows that pt is on Eliquis 5 mg PO BID.  Per med rec info available pt last dose of Eliquis was on 8/14.   Baseline Labs:  HL > 1.10 is elevated due recent use of liquis  Pt is profound renal failure as Scr is 13.20  Hgb 10.2, plt 191  aPTT 43 seconds  Goal of Therapy:  Heparin level 0.3-0.7 units/ml Monitor platelets by anticoagulation protocol: Yes   Plan:  Start Heparin 1100 units /hr with no bolus  Initially monitor using aPTT levels until aPTT and HL start to correlate  Obtain aPTT 8 hours after start of infusion  Daily CBC  Monitor for signs and symptoms of bleeding   Royetta Asal, PharmD, BCPS 07/04/2021 9:01 AM

## 2021-07-04 NOTE — ED Triage Notes (Signed)
Pt BIB EMS from home c/o possible FTT. Wife reports worsening weakness and confusion x 2 - 3 weeks. Wife unable to take care of him at home due to deterioration. Initial BP [id number] systolic. Last BP 96/54. 18G LAC. 500 cc NS given PTA.

## 2021-07-04 NOTE — Progress Notes (Signed)
Pharmacy Antibiotic Note  Eric Krueger is a 75 y.o. male admitted on 07/04/2021 with sepsis.  Pharmacy has been consulted for cefepime dosing.  Plan: Cefepime 1 gr iv Q24H    Height: '6\' 2"'$  (188 cm) Weight: 74.8 kg (165 lb) IBW/kg (Calculated) : 82.2  Temp (24hrs), Avg:96.8 F (36 C), Min:94 F (34.4 C), Max:98.4 F (36.9 C)  Recent Labs  Lab 07/04/21 0158 07/04/21 0205 07/04/21 0557 07/04/21 0647  WBC  --  6.4  --   --   CREATININE  --  12.66* 13.20*  --   LATICACIDVEN 1.2  --   --  1.7    Estimated Creatinine Clearance: 5 mL/min (A) (by C-G formula based on SCr of 13.2 mg/dL (H)).    Allergies  Allergen Reactions   Naproxen Sodium     Other reaction(s): Other (See Comments) Does not take per cardiologist   Other     Other reaction(s): Redness Other reaction(s): Other (See Comments) Other reaction(s): Redness   Adhesive [Tape]    Aspirin     Other reaction(s): Other (See Comments)   Ezetimibe-Simvastatin     myalgias Other reaction(s): Other (See Comments) myalgias   Latex     Other reaction(s): Other (See Comments)   Nsaids     Other reaction(s): Other (See Comments) Cannot take d/t defibrillator/pacemaker   Pedi-Pre Tape Spray [Wound Dressing Adhesive]     Other reaction(s): Other (See Comments)   Saw Palmetto Diarrhea    Antimicrobials this admission: 8/14 cefepime  >>      Dose adjustments this admission:   Microbiology results: 8/14 BCx:  8/14 UCx:      Thank you for allowing pharmacy to be a part of this patient's care.   Royetta Asal, PharmD, BCPS 07/04/2021 9:04 AM

## 2021-07-04 NOTE — ED Notes (Signed)
Palliative at bedside

## 2021-07-04 NOTE — ED Notes (Signed)
Pt transferred to hospital bed for comfort.

## 2021-07-04 NOTE — ED Provider Notes (Signed)
Blood pressure (!) 85/52, pulse 60, temperature 98.1 F (36.7 C), temperature source Axillary, resp. rate 13, height '6\' 2"'$  (1.88 m), weight 74.8 kg, SpO2 97 %.  In short, Eric Krueger is a 75 y.o. male with a chief complaint of Weakness and Failure To Thrive .  Refer to the original H&P for additional details.  09:00 AM  Patient arrived from the [facility] with new renal failure, hyperkalemia and soft BP. [facility] and Nephrology consulted previously. Updated Dr. Jonnie Finner that patient has arrived in the ED. [facility] aware that patient has arrived as well.     Margette Fast, MD 07/04/21 [phone]

## 2021-07-04 NOTE — Sepsis Progress Note (Signed)
Following per sepsis protocol   

## 2021-07-04 NOTE — Consult Note (Signed)
Renal Service Consult Note [facility]  Eric Krueger 07/04/2021 Eric Krabbe, MD Requesting Physician: Dr Alvino Chapel  Reason for Consult: Renal failure HPI: The patient is a 75 y.o. year-old w/ hx of atrial fib, PE, CHB sp PPM, hx Vtach sp AICD came to ED last night due to progressive poor po intake, confusion and gen'd weakness. Also urinary incontinence and difficulty voiding, recently started on flomax by his urologist. No n/v/d. Pt has hx of dementia. In ED pt was hypothermic, BP's soft int he 90's, LA wnl. Creat 12 and K 6.2, creat was 1.2 in Jan 2022.  Hb 12.5, EKG w/ paced rhythm.  BCx's drawn and IV abx and IVF's were started.  Asked to see for renal failure.     Seen in ED.  Wife giving hx and son (ED RN) is on the phone. He has dementia but is functional to some extent up to about 2 mos ago and has been steadily declining over the last 82mos.  Had voiding issues as noted above. When foley placed here last night 750 cc came out immediately. Pt is lethargic but arouses and answers pleasantly simple questions. No n/v + did have loss of appetite, fatigue and somnolence. No seizures or jerking.       ROS - denies CP, no joint pain, no HA, no blurry vision, no rash, no diarrhea, no nausea/ vomiting, no dysuria, no difficulty voiding   Past Medical History  Past Medical History:  Diagnosis Date   AICD (automatic cardioverter/defibrillator) present    Dual implantable cardioverter-defibrillator in situ    Dyslipidemia    Dysrhythmia    Presence of permanent cardiac pacemaker    Pulmonary embolism (HCC)    Sigmoid diverticulosis    Tricuspid regurgitation    Ventricular tachycardia [facility])    Past Surgical History History reviewed. No pertinent surgical history. Family History  Family History  Problem Relation Age of Onset   Diabetes Mellitus II Other    Social History  reports that he has never smoked. He has never used smokeless tobacco. He reports that he does  not currently use alcohol. He reports that he does not use drugs. Allergies  Allergies  Allergen Reactions   Naproxen Sodium     Other reaction(s): Other (See Comments) Does not take per cardiologist   Other     Other reaction(s): Redness Other reaction(s): Other (See Comments) Other reaction(s): Redness   Adhesive [Tape]    Aspirin     Other reaction(s): Other (See Comments)   Ezetimibe-Simvastatin     myalgias Other reaction(s): Other (See Comments) myalgias   Latex     Other reaction(s): Other (See Comments)   Nsaids     Other reaction(s): Other (See Comments) Cannot take d/t defibrillator/pacemaker   Pedi-Pre Tape Spray [Wound Dressing Adhesive]     Other reaction(s): Other (See Comments)   Saw Palmetto Diarrhea   Home medications Prior to Admission medications   Medication Sig Start Date End Date Taking? Authorizing Provider  acetaminophen (TYLENOL) 500 MG tablet Take by mouth. 09/22/16   [provider]  atorvastatin (LIPITOR) 10 MG tablet  02/15/15   [provider]  cyanocobalamin ([employer] VITAMIN B12) 1000 MCG tablet Take by mouth. 12/20/18   [provider]  donepezil (ARICEPT) 5 MG tablet Take 5 mg by mouth at bedtime.    [provider]  ELIQUIS 5 MG TABS tablet  01/24/15   [provider]  fenofibrate (TRICOR) 145 MG tablet  02/15/15   [provider]  gabapentin (NEURONTIN) 300 MG capsule TAKE 1 TO 2 CAPSULES EVERY EVENING BEFORE BEDTIME. Patient not taking: Reported on 07/04/2021 11/30/18   [provider]  memantine (NAMENDA) 10 MG tablet Take by mouth. 0.5 tab daily x 1 week; 1 tab x 1 week; 1.5 tab x 1 week; then 2 tab daily for memory 06/02/21   [provider]  mirtazapine (REMERON) 15 MG tablet Take 15 mg by mouth at bedtime. 03/21/21   [provider]  MYRBETRIQ 50 MG TB24 tablet Take 50 mg by mouth daily. 06/26/21   [provider]  NEOMYCIN-POLYMYXIN-HYDROCORTISONE (CORTISPORIN) 1  % SOLN OTIC solution Apply 1-2 drops to toe BID after soaking 01/01/19   Hyatt, Max T, DPM  sotalol (BETAPACE) 160 MG tablet  03/28/15   [provider]  tamsulosin (FLOMAX) 0.4 MG CAPS capsule Take 0.4 mg by mouth daily. 05/06/21   [provider]     Vitals:   07/04/21 0500 07/04/21 0530 07/04/21 0545 07/04/21 0719  BP: $Re'90/65 91/64 96/64 'SNJ$   Pulse: 60 60 61   Resp: $Remo'20 20 20   'iJaNO$ Temp:    98.4 F (36.9 C)  TempSrc:    Rectal  SpO2: 96% 97% 98%   Weight:      Height:       Exam Gen lethargic, disheveled, very dry mouth, awakens to loud voice and answers politely No rash, cyanosis or gangrene Sclera anicteric, throat clear and dry No jvd or bruits, flat neck veins Chest clear bilat to bases, no rales/ wheezing RRR no MRG Abd soft ntnd no mass or ascites +bs GU foley in place draining dark red urine w/ settling MS no joint effusions or deformity Ext bilat 2+ pitting pretib edema, no hip or other edema Neuro is somnolent but arouses easily, no myoclonic jerking, moves all ext, gen'd weakness, nonfocal         Home meds include lipitor, eliquis, tricor, neurontin, namenda, remeron, myrbetriq, sotalol, flomax     CXR - no acute disease  I/O are 2.3 L + w/ no UOP recorded yet   Na 139  K 6.2  CO2 14  BUN 257  Cr 12  Ca 9  alb 3.0  LFT"s wnl    WBC 6k  Hb 12  INR 3.7  PTT 43    UNa 37, UCr 59     UA clear, red-brown, 100 prot, > 50 rbc, 0-5 wbc      Renal US - bilat hydro, 12.4 and 11.1 cm kidneys, foley cath within the bladder   In [facility] labs > creat in Dec 15 2020 was 1.41 w/ eGFR 56 ml/min     ECHO June 2021 - LVEF 55-60%, RV severely filated, ICD lead in RV, syst fxn wnl. LA/RA severely dilated.     Assessment/ Plan: AKI on CKD 3a - b/l creat Jan 2022 is 1.41 w/ eGFR 56 ml/min. Here w/ progressive weakness and poor po intake. CXR clear.  BP's soft in the 80's-90's on arrival now improved after 2.5 L bolus. Pt w/ chronic LE edema for many yrs.  Urine  lytes c/w ATN or obstruction.  Renal US showing bilat hydro. Causes are multiple including primarily obstruction (bladder outlet), hypotension and intrvasc vol depletion. Less likely sepsis. BP's up w/ IVF's and making urine w/ foley in place. Rx plan keep foley in, monitor labs and UOP, cont IVF"s, get BP up. Would not dialyze since this is  likely reversible, he is making urine, and he is able to awaken and respond appropriately. Will follow.  Hyperkalemia - sp temporizing ED measures including lokelma. If eating should get renal diet.  Reordered tid lokelma.  Dementia  CHB sp PPM H/o VT sp ICD      Kelly Splinter  MD 07/04/2021, 7:40 AM  Recent Labs  Lab 07/04/21 0205 07/04/21 0557  WBC 6.4  --   HGB 12.4* 10.2*   Recent Labs  Lab 07/04/21 0205 07/04/21 0557  K >6.2* 6.4*  BUN 257* >130*  CREATININE 12.66* 13.20*  CALCIUM 9.0  --

## 2021-07-04 NOTE — ED Notes (Addendum)
Pt was given ordered Lokelma, HOB was elevated & pt became strangled. Aline August, MD was made aware & SLP consult was ordered by the provider.

## 2021-07-04 NOTE — H&P (Signed)
History and Physical    Eric Krueger [id number] DOB: October 25, 1946 DOA: 07/04/2021  PCP: Joneen Boers, MD  Patient coming from: Home.  History obtained from patient's wife.  Patient has dementia.  Chief Complaint: Weakness.  Poor oral intake.  HPI: Eric Krueger is a 75 y.o. 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 tach status post defibrillator placement was brought to the ER after patient was progressively getting weaker poor oral intake increased confusion over the last 2 weeks.  Patient wife was providing the history states that patient has been having some urinary incontinence and difficulty urinating for which patient was placed on tamsulosin by patient's urologist recently.  His urinary symptoms improved but patient has been having poor oral intake and progressively getting weaker.  So patient's wife decided to bring him to the ER.  Denies any nausea vomiting diarrhea chest pain or shortness of breath.  Patient has dementia.  ED Course: In the ER patient was hypothermic with temperature of 94 F blood pressure systolic was in the 90 systolic lactic acid was normal.  Patient was placed on a Retail banker.  Labs show potassium more than 6.2 creatinine of 12.6 which is markedly increased from almost normal in January 2022 which is available in [place].  Hemoglobin 12.4 COVID test are negative EKG shows paced rhythm.  ER physician I discussed with on-call nephrologist and pulmonary critical care.  Nephrology recommended placing patient on bicarbonate drip.  Patient also was given D50 IV insulin and calcium gluconate.  Blood cultures were drawn and started on empiric antibiotics.  Patient will be admitted for acute renal failure with hypothermia poor oral intake failure to thrive.  Patient is yet to give a sample of urine.  Renal ultrasound is pending.  Review of Systems:  As per HPI, rest all negative.   Past Medical History:  Diagnosis Date   AICD (automatic cardioverter/defibrillator) present    Dual implantable cardioverter-defibrillator in situ    Dyslipidemia    Dysrhythmia    Presence of permanent cardiac pacemaker    Pulmonary embolism (HCC)    Sigmoid diverticulosis    Tricuspid regurgitation    Ventricular tachycardia ([place])     History reviewed. No pertinent surgical history.   reports that he has never smoked. He has never used smokeless tobacco. He reports that he does not currently use alcohol. He reports that he does not use drugs.  Allergies  Allergen Reactions   Naproxen Sodium     Other reaction(s): Other (See Comments) Does not take per cardiologist   Other     Other reaction(s): Redness Other reaction(s): Other (See Comments) Other reaction(s): Redness   Adhesive [Tape]    Aspirin     Other reaction(s): Other (See Comments)   Ezetimibe-Simvastatin     myalgias Other reaction(s): Other (See Comments) myalgias   Latex     Other reaction(s): Other (See Comments)   Nsaids     Other reaction(s): Other (See Comments) Cannot take d/t defibrillator/pacemaker   Pedi-Pre Tape Spray [Wound Dressing Adhesive]     Other reaction(s): Other (See Comments)   Saw Palmetto Diarrhea    Family History  Problem Relation Age of Onset   Diabetes Mellitus II Other     Prior to Admission medications   Medication Sig Start Date End Date Taking? Authorizing Provider  acetaminophen (TYLENOL) 500 MG tablet Take by mouth. 09/22/16   [provider]  atorvastatin (LIPITOR) 10 MG tablet  02/15/15   [provider]  cyanocobalamin ([employer] VITAMIN B12) 1000 MCG tablet Take by mouth. 12/20/18   [provider]  donepezil (ARICEPT) 5 MG tablet Take 5 mg by mouth at bedtime.    [provider]  ELIQUIS 5 MG TABS tablet  01/24/15   [provider]  fenofibrate (TRICOR) 145 MG tablet  02/15/15   [provider]  gabapentin (NEURONTIN) 300 MG capsule TAKE 1 TO 2 CAPSULES EVERY EVENING BEFORE BEDTIME. Patient not taking: Reported on 07/04/2021 11/30/18   [provider]  memantine (NAMENDA) 10 MG tablet Take by mouth. 0.5 tab daily x 1 week; 1 tab x 1 week; 1.5 tab x 1 week; then 2 tab daily for memory 06/02/21   [provider]  mirtazapine (REMERON) 15 MG tablet Take 15 mg by mouth at bedtime. 03/21/21   [provider]  MYRBETRIQ 50 MG TB24 tablet Take 50 mg by mouth daily. 06/26/21   [provider]  NEOMYCIN-POLYMYXIN-HYDROCORTISONE (CORTISPORIN) 1 % SOLN OTIC solution Apply 1-2 drops to toe BID after soaking 01/01/19   Hyatt, Max T, DPM  sotalol (BETAPACE) 160 MG tablet  03/28/15   [provider]  tamsulosin (FLOMAX) 0.4 MG CAPS capsule Take 0.4 mg by mouth daily. 05/06/21   [provider]    Physical Exam: Constitutional: Moderately built and nourished. Vitals:   07/04/21 0430 07/04/21 0500 07/04/21 0530 07/04/21 0545  BP: 1'05/65 90/65 91/64 '$ 96/64  Pulse: 84 60 60 61  Resp: '20 20 20 20  '$ Temp:      TempSrc:      SpO2: 95% 96% 97% 98%  Weight: 74.8 kg     Height: '6\' 2"'$  (1.88 m)      Eyes: Anicteric no pallor. ENMT: No discharge from the ears eyes nose and mouth. Neck: No mass felt.  No neck rigidity. Respiratory: No rhonchi or crepitations. Cardiovascular: S1-S2 heard. Abdomen: Soft nontender bowel sound present. Musculoskeletal: Mild edema of the both lower extremities. Skin: Chronic skin changes of the lower extremity. Neurologic: Alert awake but not oriented.  Moving all extremities. Psychiatric: Not oriented.  Mood alert.   Labs on Admission: I have personally reviewed following labs and imaging studies  CBC: Recent Labs  Lab 07/04/21 0205 07/04/21 0557  WBC 6.4  --   NEUTROABS 5.4  --   HGB 12.4* 10.2*  HCT 37.9* 30.0*  MCV 90.9  --   PLT 191  --    Basic Metabolic Panel: Recent Labs  Lab 07/04/21 0205  07/04/21 0557  NA 139 141  K >6.2* 6.4*  CL 111 113*  CO2 14*  --   GLUCOSE 141* 66*  BUN 257* >130*  CREATININE 12.66* 13.20*  CALCIUM 9.0  --    GFR: Estimated Creatinine Clearance: 5 mL/min (A) (by C-G formula based on SCr of 13.2 mg/dL (H)). Liver Function Tests: Recent Labs  Lab 07/04/21 0205  AST 77*  ALT 80*  ALKPHOS 35*  BILITOT 1.3*  PROT 6.2*  ALBUMIN 3.0*   No results for input(s): LIPASE, AMYLASE in the last 168 hours. No results for input(s): AMMONIA in the last 168 hours. Coagulation Profile: Recent Labs  Lab 07/04/21 0205  INR 3.7*   Cardiac Enzymes: No results for input(s): CKTOTAL, CKMB, CKMBINDEX, TROPONINI in the last 168 hours. BNP (last 3 results) No results for input(s): PROBNP in the last 8760 hours. HbA1C: No results for input(s): HGBA1C  in the last 72 hours. CBG: Recent Labs  Lab 07/04/21 0154  GLUCAP 114*   Lipid Profile: No results for input(s): CHOL, HDL, LDLCALC, TRIG, CHOLHDL, LDLDIRECT in the last 72 hours. Thyroid Function Tests: No results for input(s): TSH, T4TOTAL, FREET4, T3FREE, THYROIDAB in the last 72 hours. Anemia Panel: No results for input(s): VITAMINB12, FOLATE, FERRITIN, TIBC, IRON, RETICCTPCT in the last 72 hours. Urine analysis: No results found for: COLORURINE, APPEARANCEUR, LABSPEC, PHURINE, GLUCOSEU, HGBUR, BILIRUBINUR, KETONESUR, PROTEINUR, UROBILINOGEN, NITRITE, LEUKOCYTESUR Sepsis Labs: '@LABRCNTIP'$ (procalcitonin:4,lacticidven:4) ) Recent Results (from the past 240 hour(s))  Resp Panel by RT-PCR (Flu A&B, Covid) Nasopharyngeal Swab     Status: None   Collection Time: 07/04/21  1:57 AM   Specimen: Nasopharyngeal Swab; Nasopharyngeal(NP) swabs in vial transport medium  Result Value Ref Range Status   SARS Coronavirus 2 by RT PCR NEGATIVE NEGATIVE Final    Comment: (NOTE) SARS-CoV-2 target nucleic acids are NOT DETECTED.  The SARS-CoV-2 RNA is generally detectable in upper respiratory specimens during the  acute phase of infection. The lowest concentration of SARS-CoV-2 viral copies this assay can detect is 138 copies/mL. A negative result does not preclude SARS-Cov-2 infection and should not be used as the sole basis for treatment or other patient management decisions. A negative result may occur with  improper specimen collection/handling, submission of specimen other than nasopharyngeal swab, presence of viral mutation(s) within the areas targeted by this assay, and inadequate number of viral copies(<138 copies/mL). A negative result must be combined with clinical observations, patient history, and epidemiological information. The expected result is Negative.  Fact Sheet for Patients:  EntrepreneurPulse.com.au  Fact Sheet for Healthcare Providers:  IncredibleEmployment.be  This test is no t yet approved or cleared by the [place] FDA and  has been authorized for detection and/or diagnosis of SARS-CoV-2 by FDA under an Emergency Use Authorization (EUA). This EUA will remain  in effect (meaning this test can be used) for the duration of the COVID-19 declaration under Section 564(b)(1) of the Act, 21 U.S.C.section 360bbb-3(b)(1), unless the authorization is terminated  or revoked sooner.       Influenza A by PCR NEGATIVE NEGATIVE Final   Influenza B by PCR NEGATIVE NEGATIVE Final    Comment: (NOTE) The Xpert Xpress SARS-CoV-2/FLU/RSV plus assay is intended as an aid in the diagnosis of influenza from Nasopharyngeal swab specimens and should not be used as a sole basis for treatment. Nasal washings and aspirates are unacceptable for Xpert Xpress SARS-CoV-2/FLU/RSV testing.  Fact Sheet for Patients: EntrepreneurPulse.com.au  Fact Sheet for Healthcare Providers: IncredibleEmployment.be  This test is not yet approved or cleared by the [place] FDA and has been authorized for detection and/or  diagnosis of SARS-CoV-2 by FDA under an Emergency Use Authorization (EUA). This EUA will remain in effect (meaning this test can be used) for the duration of the COVID-19 declaration under Section 564(b)(1) of the Act, 21 U.S.C. section 360bbb-3(b)(1), unless the authorization is terminated or revoked.  Performed at [facility], [place] [address], [place], [place] [address]      Radiological Exams on Admission: DG Chest Port 1 View  Result Date: 07/04/2021 CLINICAL DATA:  Weakness, confusion, failure to thrive EXAM: PORTABLE CHEST 1 VIEW COMPARISON:  None. FINDINGS: Lungs are clear.  No pleural effusion or pneumothorax. Cardiomegaly.  Left subclavian ICD. IMPRESSION: Cardiomegaly. No evidence of acute cardiopulmonary disease. Electronically Signed   By: Julian Hy M.D.   On: 07/04/2021 02:18    EKG: Independently reviewed.  Paced rhythm.  Assessment/Plan Active Problems:   Atrial fibrillation, chronic (HCC)   Complete heart block (HCC)   History of pulmonary embolism   Sick sinus syndrome (HCC)   ARF (acute renal failure) (HCC)   Hypothermia    Acute renal failure with hyperkalemia -renal ultrasound is pending.  Patient also has yet to give a urine sample.  Could be from recent poor oral intake.  Patient is started on bicarbonate drip.  Patient was given D50 insulin and bicarbonate push and calcium gluconate.  I will order Lokelma.  Repeat metabolic panel to be done in few hours.  Nephrology has been consulted.  Patient be transferred to [facility]. Hypothermia cause not clear.  Blood cultures has been ordered.  Order cortisol levels TSH and UA keep patient on empiric antibiotics. History of A. fib and pulm embolism takes Eliquis.  We will keep patient on heparin for now. History of V. tach status post defibrillator placement. History of sinus node dysfunction and complete heart block status post pacemaker placement. Anemia follow CBC. History of dementia on  Namenda.   Home medications needs to be verified.   Since patient has acute renal failure with hypokalemia will need close monitoring for any further worsening and inpatient status.  I discussed with patient's wife at the bedside about the critical nature of patient's condition.   DVT prophylaxis: Heparin infusion. Code Status: Full code.  Confirmed with patient's wife. Family Communication: Patient's wife. Disposition Plan: To be determined. Consults called: Nephrology.  Discussed with pulmonary critical care. Admission status: Inpatient.   Rise Patience MD [facility] Pager [phone].  If 7PM-7AM, please contact night-coverage [email or web address] Password [facility]  07/04/2021, 6:27 AM

## 2021-07-04 NOTE — Consult Note (Signed)
Palliative Medicine Inpatient Consult Note  Consulting Provider: Aline August, MD  Reason for consult:   [facility] Consult  Reason for Consult? goals of care   HPI:  Per intake H&P --> Eric Krueger is a 75 y.o. 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 tach status post defibrillator placement, dementia. He was brought to the ER after patient was progressively getting weaker poor oral intake increased confusion over the last 2 weeks.  Patient wife was providing the history states that patient has been having some urinary incontinence and difficulty urinating for which patient was placed on tamsulosin by patient's urologist recently.  His urinary symptoms improved but patient has been having poor oral intake and progressively getting weaker.   Palliative care was asked to get involved to aid in further goals of care conversations in the setting of progressive dementia and renal dysfunction.  Clinical Assessment/Goals of Care:  *Please note that this is a verbal dictation therefore any spelling or grammatical errors are due to the "[facility]" system interpretation.  I have reviewed medical records including EPIC notes, labs and imaging, received report from bedside RN, assessed the patient who is lying in bed, he is pleasant and able to state his name for me.     I met with patients spouse, Eric Krueger to further discuss diagnosis prognosis, GOC, EOL wishes, disposition and options.  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 Eric Krueger shares that despite this diagnosis, Eric Krueger functions fairly well and has a good quality of life. She states that he had in the last few days been  noted to have an increase in urinary frequency and new incontinence. He had taken his dog out for a walk and fallen down the steps and later was noted to be disoriented in the night putting his shoes on and getting dressed at 11PM. These abnormal behaviors prompted Eric Krueger to bring Eric Krueger to the ER.   I introduced Palliative Medicine as specialized medical care for people living with serious illness. It focuses on providing relief from the symptoms and stress of a serious illness. The goal is to improve quality of life for both the patient and the family.  Eric Krueger lives in [place], [place]. He is married to his spouse, Eric Krueger and they share two sons. One of her sons is an Eric Krueger at [employer] and one of her sons lives in [place], [place]. 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 He is a man of faith and has a personal relationship with God.   Prior to admission, Eric Krueger was able to walk with a walker and dress himself in the morning. His spouse would help him with clothing removal at night, otherwise he was well functioning.  From a nutritional perspective, Eric Krueger was eating and drinking fairly until his recent decline from acute renal failure.  A detailed discussion was had today regarding advanced directives - Eric Krueger shares that these had previously been created .    Concepts specific to code status, artifical feeding and hydration, continued IV antibiotics and rehospitalization was had.  At this time, Eric Krueger would like to pursue all measures to improve Eric Krueger's Eric state. She is  not ready to place a DNR order as his decline has been "so acute". Reviewed his co-morbidities and that there would be great concern of physical burden to him if aggressive interventions such as CPR were performed on him. Irregardless, Eric Krueger again would like to reverse reversible causes with the goal of  improvements.  Eric Krueger understands that Eric Krueger's renal function right now is very poor. She and her family are open to interventions to improve this. She shares that a trial of HD would be acceptable to reverse his present renal dysfunction though she knows that he would not want dialysis treatments permanently 3x a week if it came down to that.   Eric Krueger expresses feeling over burdened by six healthcare teams coming in and asking her the same questions. She expresses frustration to me about the teams not understanding her wishes. I provided a safe space for her to express these concerns. I provided the insights that Palliative care is present to advocate for the patients we meet and their families. Reviewed that we will continue to be peripherally involved in the oncoming days. She was thankful for this and stated that her goals remain clear.   Discussed the importance of continued conversation with family and their  medical providers regarding overall plan of care and treatment options, ensuring decisions are within the context of the patients values and GOCs.  Decision Maker: Eric Krueger (spouse) [phone]  SUMMARY OF RECOMMENDATIONS   Full Code / Full Scope of Care  Patients wife desires for him to receive tx to reverse his ARF she would be open to trial of dialysis but would not want this long term  Patients wife has advance directives, has been asked to bring these in  Palliative care will remain to peripherally follow though we do not plan to get re-involved unless patient goals change or a clinical decline occurs per family wishes  Code Status/Advance Care Planning: FULL CODE   Palliative Prophylaxis:  Oral Care, Mobility, Pain, Delirium  Additional Recommendations (Limitations, Scope, Preferences): Continue current scope of care   Psycho-social/Spiritual:  Desire for further Chaplaincy support: No Additional Recommendations: Education on chronic disease processes    Prognosis: Unclear presently.  Discharge Planning: To be determined  Vitals:   07/04/21 1330 07/04/21 1400  BP: 127/60 (!) 112/57  Pulse: 61 (!) 59  Resp: 18 17  Temp:    SpO2: 100% 100%    Intake/Output Summary (Last 24 hours) at 07/04/2021 1420 Last data filed at 07/04/2021 1329 Gross per 24 hour  Intake 2856.25 ml  Output 1675 ml  Net 1181.25 ml   Last Weight  Most recent update: 07/04/2021  5:02 AM    Weight  74.8 kg (165 lb)            Gen: Chronically ill elderly M in NAD HEENT: moist mucous membranes CV: Regular rate and rhythm  PULM: On RA ABD: soft/nontender  EXT: No edema  Neuro: Alert and oriented to self  PPS: 30%   This conversation/these recommendations were discussed with patient primary care team, Dr. Starla Link  Time In: 1300 Time Out: 1410 Total Time: 70 Greater than 50%  of this time was spent counseling and coordinating care related to the above assessment and plan.  [facility] Team Team Cell Phone: [phone] Please utilize secure chat with additional questions, if there is no response within 30 minutes please call the above phone number  Palliative Medicine Team providers are available by phone from 7am to  7pm daily and can be reached through the team cell phone.  Should this patient require assistance outside of these hours, please call the patient's attending physician.

## 2021-07-04 NOTE — Consult Note (Signed)
NAME:  Eric Krueger, MRN:  [id number], DOB:  1946-02-18, LOS: 0 ADMISSION DATE:  07/04/2021, CONSULTATION DATE:  8/14  REFERRING MD: Dr. Hal Hope, CHIEF COMPLAINT:  Acute renal failure   History of Present Illness:  75 year old man with atrial fibrillation, history of PE, pacemaker for third-degree heart block, history of VT with defibrillator in place, dementia.  He has had poor p.o. intake for several weeks associated with progressive confusion.  He had been having some urinary incontinence started myrbetriq and then added tamsulosin by urology recently.  The incontinence improved some but he has continued to have very poor intake.  Brought to the ED 8/14 with weakness, confusion. Noted to have new acute renal failure, hyperkalemia 6.2, received bicarbonate infusion, D50 and insulin, calcium gluconate, then Lokelma.  He was hypothermic and was started on empiric antibiotics, no clear source of infection noted.  Lactic acid reassuring.  Blood pressure marginal with some SBP in the 80s.  Chest x-ray with cardiomegaly, no infiltrates.  Renal ultrasound with bilateral mild to moderate hydronephrosis without any obstructive lesion.  Foley placed.  Moved to [facility] ED from [facility] this morning 8/14  Pertinent  Medical History   Past Medical History:  Diagnosis Date   AICD (automatic cardioverter/defibrillator) present    Dual implantable cardioverter-defibrillator in situ    Dyslipidemia    Dysrhythmia    Presence of permanent cardiac pacemaker    Pulmonary embolism (HCC)    Sigmoid diverticulosis    Tricuspid regurgitation    Ventricular tachycardia ([place])     Significant Hospital Events: Including procedures, antibiotic start and stop dates in addition to other pertinent events   Admitted with new acute renal failure 8/14 Renal ultrasound 8/14 >> moderate right hydronephrosis without obstructing stone, mild left hydronephrosis without obstructing stone, bladder decompressed by Foley  catheter Blood and urine cx 8/14 >   Interim History / Subjective:   Currently normotensive 105/85 He is starting to make some Urine with rehydration Heparin gtt now on hold   Objective   Blood pressure 120/83, pulse (!) 59, temperature 98.1 F (36.7 C), temperature source Axillary, resp. rate 19, height '6\' 2"'$  (1.88 m), weight 74.8 kg, SpO2 100 %.        Intake/Output Summary (Last 24 hours) at 07/04/2021 1002 Last data filed at 07/04/2021 [id number] Gross per 24 hour  Intake 2356.25 ml  Output --  Net 2356.25 ml   Filed Weights   07/04/21 0430  Weight: 74.8 kg    Examination: General: Thin man, asleep and laying in bed HENT: OP very dry, pupils equal Lungs: distant but clear, no wheeze or crackles Cardiovascular: regular, 60, no M Abdomen: non-distended, non-tender, + BS Extremities: 2+ pitting LE edema with some angiomas, focal erythema and warmth on medial surface of distal RLE Neuro: wakes to voice, answers questions but poorly oriented. Moves all ext. Clear voice  GU: foley in place, amber urine  Resolved Hospital Problem list      Assessment & Plan:  Acute renal failure.  Suspect due to hypovolemia and hypoperfusion ATN.  Consider also obstructive component given his apparent recent BPH, hydronephrosis on renal ultrasound. Hyperkalemia, temporized medically Metabolic acidosis, due to renal failure.  Lactate reassuring -appreciate renal consultation, goal is to temporize medically and avoid HD if possible.  -lokelma ordered -If he does need HD then will help w line placement. Would need to decide whether he needs a transfer to ICU for CVVHD at that time, will depend on his BP -favor  aggressive rehydration.  No evidence of pulmonary edema on chest x-ray -Continue bicarbonate infusion for now -Follow BMP, potassium every 4 hours -consider further imaging renal collecting system or Urology consult depending on course. Tamsulosin and Myrbetriq on hold  Shock, suspect  hypovolemic.  Consider possible sepsis. He has RLE erythema, possible cellulitis -improved with volume resuscitation  -agree with empiric abx for possible RLE cellulitis -consider proccalcitonin  A Fib / Hx VT w AICD -Restart Sotolol when hemodynamically stable -heparin on hold in case he needs HD catheter placement.   Acute metabolic encephalopathy Underlying Dementia -Restart Aricept when able to take enteral medications.  It looks like he was being uptitrated on Namenda based on home medication list. Will need to determine timing restart -hold remeron for now  Hx Pulmonary embolism -Heparin infusion for anticoagulation, on hold  Hyperlipidemia -Start atorvastatin and fenofibrate when able to take enteral meds   Best Practice (right click and "Reselect all SmartList Selections" daily)   Diet/type: Regular consistency (see orders) DVT prophylaxis: systemic heparin GI prophylaxis: N/A Lines: N/A Foley:  Yes, and it is still needed Code Status:  full code Last date of multidisciplinary goals of care discussion [Discussed Code status with pt's wife at bedside 8/14. He would want aggressive care to treat reversible problems, would not want long term invasive support. Full Code]  Labs   CBC: Recent Labs  Lab 07/04/21 0205 07/04/21 0557  WBC 6.4  --   NEUTROABS 5.4  --   HGB 12.4* 10.2*  HCT 37.9* 30.0*  MCV 90.9  --   PLT 191  --     Basic Metabolic Panel: Recent Labs  Lab 07/04/21 0205 07/04/21 0557  NA 139 141  K >6.2* 6.4*  CL 111 113*  CO2 14*  --   GLUCOSE 141* 66*  BUN 257* >130*  CREATININE 12.66* 13.20*  CALCIUM 9.0  --    GFR: Estimated Creatinine Clearance: 5 mL/min (A) (by C-G formula based on SCr of 13.2 mg/dL (H)). Recent Labs  Lab 07/04/21 0158 07/04/21 0205 07/04/21 0647  WBC  --  6.4  --   LATICACIDVEN 1.2  --  1.7    Liver Function Tests: Recent Labs  Lab 07/04/21 0205  AST 77*  ALT 80*  ALKPHOS 35*  BILITOT 1.3*  PROT 6.2*   ALBUMIN 3.0*   No results for input(s): LIPASE, AMYLASE in the last 168 hours. No results for input(s): AMMONIA in the last 168 hours.  ABG    Component Value Date/Time   TCO2 14 (L) 07/04/2021 0557     Coagulation Profile: Recent Labs  Lab 07/04/21 0205  INR 3.7*    Cardiac Enzymes: No results for input(s): CKTOTAL, CKMB, CKMBINDEX, TROPONINI in the last 168 hours.  HbA1C: No results found for: HGBA1C  CBG: Recent Labs  Lab 07/04/21 0154 07/04/21 0748 07/04/21 0801 07/04/21 0844  GLUCAP 114* 44* 178* 87    Review of Systems:   As per HPI. Denies any pain  Past Medical History:  He,  has a past medical history of AICD (automatic cardioverter/defibrillator) present, Dual implantable cardioverter-defibrillator in situ, Dyslipidemia, Dysrhythmia, Presence of permanent cardiac pacemaker, Pulmonary embolism ([place]), Sigmoid diverticulosis, Tricuspid regurgitation, and Ventricular tachycardia ([place]).   Surgical History:  History reviewed. No pertinent surgical history.   Social History:   reports that he has never smoked. He has never used smokeless tobacco. He reports that he does not currently use alcohol. He reports that he does not use drugs.  Family History:  His family history includes Diabetes Mellitus II in an other family member.   Allergies Allergies  Allergen Reactions   Naproxen Sodium     Other reaction(s): Other (See Comments) Does not take per cardiologist   Other     Other reaction(s): Redness Other reaction(s): Other (See Comments) Other reaction(s): Redness   Adhesive [Tape]    Aspirin     Other reaction(s): Other (See Comments)   Ezetimibe-Simvastatin     myalgias Other reaction(s): Other (See Comments) myalgias   Latex     Other reaction(s): Other (See Comments)   Nsaids     Other reaction(s): Other (See Comments) Cannot take d/t defibrillator/pacemaker   Pedi-Pre Tape Spray [Wound Dressing Adhesive]     Other reaction(s): Other  (See Comments)   Saw [place] Diarrhea     Home Medications  Prior to Admission medications   Medication Sig Start Date End Date Taking? Authorizing Provider  acetaminophen (TYLENOL) 500 MG tablet Take 500 mg by mouth every 6 (six) hours as needed. 09/22/16  Yes [provider]  atorvastatin (LIPITOR) 10 MG tablet Take 10 mg by mouth daily. 02/15/15  Yes [provider]  ELIQUIS 5 MG TABS tablet Take 5 mg by mouth 2 (two) times daily. 01/24/15  Yes [provider]  fenofibrate (TRICOR) 145 MG tablet Take 145 mg by mouth daily. 02/15/15  Yes [provider]  memantine (NAMENDA) 10 MG tablet Take 10 mg by mouth 2 (two) times daily. 0.5 tab daily x 1 week; 1 tab x 1 week; 1.5 tab x 1 week; then 2 tab daily for memory 06/02/21  Yes [provider]  mirtazapine (REMERON) 15 MG tablet Take 15 mg by mouth at bedtime. 03/21/21  Yes [provider]  MYRBETRIQ 50 MG TB24 tablet Take 50 mg by mouth daily. 06/26/21  Yes [provider]  sotalol (BETAPACE) 80 MG tablet Take 80 mg by mouth 2 (two) times daily. 03/28/15  Yes [provider]  tamsulosin (FLOMAX) 0.4 MG CAPS capsule Take 0.4 mg by mouth daily. 05/06/21  Yes [provider]  cyanocobalamin 1000 MCG tablet Take by mouth. Patient not taking: Reported on 07/04/2021 12/20/18   [provider]  donepezil (ARICEPT) 5 MG tablet Take 5 mg by mouth at bedtime. Patient not taking: Reported on 07/04/2021    [provider]  gabapentin (NEURONTIN) 300 MG capsule TAKE 1 TO 2 CAPSULES EVERY EVENING BEFORE BEDTIME. Patient not taking: Reported on 07/04/2021 11/30/18   [provider]  NEOMYCIN-POLYMYXIN-HYDROCORTISONE (CORTISPORIN) 1 % SOLN OTIC solution Apply 1-2 drops to toe BID after soaking Patient not taking: Reported on 07/04/2021 01/01/19   Garrel Ridgel, [place]     Critical care time:  N/A      Baltazar Apo, MD, PhD 07/04/2021, 10:29 AM [facility] [phone] or if no answer before 7:00PM call [phone] For any issues after 7:00PM please call eLink [phone]

## 2021-07-04 NOTE — ED Notes (Signed)
Applied external male purewick on pt.

## 2021-07-04 NOTE — Evaluation (Signed)
Clinical/Bedside Swallow Evaluation Patient Details  Name: Eric Krueger MRN: [id number] Date of Birth: [birth year]  Today's Date: 07/04/2021 Time: SLP Start Time (ACUTE ONLY): 1750 SLP Stop Time (ACUTE ONLY): 1805 SLP Time Calculation (min) (ACUTE ONLY): 15 min  Past Medical History:  Past Medical History:  Diagnosis Date   AICD (automatic cardioverter/defibrillator) present    Dual implantable cardioverter-defibrillator in situ    Dyslipidemia    Dysrhythmia    Presence of permanent cardiac pacemaker    Pulmonary embolism (HCC)    Sigmoid diverticulosis    Tricuspid regurgitation    Ventricular tachycardia ([place])    Past Surgical History: History reviewed. No pertinent surgical history. HPI:  Patient is a 75 y.o. 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 tach status post defibrillator placement, dementia. He was brought to ER after getting progressively weaker, having poor oral intake and increased confusion over the past two weeks. In ER, he was hypotheric with temperature 94 degrees F, labs showing increased creatinine, COVID test negative, EKG normal. Lungs clear per CXR. Patient was admitted for acute renal failure with hypothermia, poor oral  intake and FTT.   Assessment / Plan / Recommendation Clinical Impression  Patient presents with a mild oropharyngeal dysphagia with likely impact from cognition due to dementia. He was observed to consume regular solids without significant difficulty or delays in mastication and with full clearance from oral cavity. He consumed consecutive straw sips of thin liquids (plain water) with timely swallow initiation and no immediate overt s/s aspiration or penetration. He did exhibit one instance of delayed dry sounding cough but voice remained clear and strong throughout. His wife does report that his mouth and likely throat have been dry due to  medications and poor intake of fluids. RN did report that earlier patient got strangled when drinking liquids. SLP is recommending to continue with current PO diet and SLP will plan to follow up with patient for diet toleration. SLP Visit Diagnosis: Dysphagia, unspecified (R13.10)    Aspiration Risk  Mild aspiration risk    Diet Recommendation Regular;Thin liquid   Liquid Administration via: Straw;Cup Medication Administration: Whole meds with puree Supervision: Full supervision/cueing for compensatory strategies;Staff to assist with self feeding Compensations: Slow rate;Minimize environmental distractions;Small sips/bites Postural Changes: Seated upright at 90 degrees    Other  Recommendations Oral Care Recommendations: Oral care BID;Staff/trained caregiver to provide oral care   Follow up Recommendations None      Frequency and Duration min 1 x/week  1 week       Prognosis Prognosis for Safe Diet Advancement: Good      Swallow Study   General Date of Onset: 07/04/21 HPI: Patient is a 75 y.o. 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 tach status post defibrillator placement, dementia. He was brought to ER after getting progressively weaker, having poor oral intake and increased confusion over the past two weeks. In ER, he was hypotheric with temperature 94 degrees F, labs showing increased creatinine, COVID test negative, EKG normal. Lungs clear per CXR. Patient was admitted for acute renal failure with hypothermia, poor oral  intake and FTT. Type of Study: Bedside Swallow Evaluation Previous Swallow Assessment: none found Diet Prior to this Study: Regular;Thin liquids Temperature Spikes Noted: No Respiratory Status: Room air History of Recent Intubation: No Behavior/Cognition: Alert;Cooperative;Pleasant mood;Confused Oral Cavity Assessment: Within Functional Limits Oral  Care Completed by SLP: No Oral Cavity - Dentition: Adequate natural dentition Self-Feeding Abilities: Needs set up;Needs assist Patient Positioning: Upright in bed Baseline Vocal Quality: Normal Volitional Cough: Cognitively unable to elicit Volitional Swallow: Unable to elicit    Oral/Motor/Sensory Function Overall Oral Motor/Sensory Function: Within functional limits   Ice Chips     Thin Liquid Thin Liquid: Impaired Presentation: Straw Pharyngeal  Phase Impairments: Cough - Delayed Other Comments: one instance of delayed, dry sounding cough after PO's of solids and liquids    Nectar Thick     Honey Thick     Puree Puree: Not tested   Solid     Solid: Within functional limits      Sonia Baller, MA, CCC-SLP Speech Therapy [facility]

## 2021-07-05 DIAGNOSIS — I442 Atrioventricular block, complete: Secondary | ICD-10-CM

## 2021-07-05 DIAGNOSIS — Z86711 Personal history of pulmonary embolism: Secondary | ICD-10-CM

## 2021-07-05 LAB — CBC WITH DIFFERENTIAL/PLATELET
Abs Immature Granulocytes: 0.02 10*3/uL (ref 0.00–0.07)
Basophils Absolute: 0.1 10*3/uL (ref 0.0–0.1)
Basophils Relative: 1 %
Eosinophils Absolute: 0.3 10*3/uL (ref 0.0–0.5)
Eosinophils Relative: 5 %
HCT: 34 % — ABNORMAL LOW (ref 39.0–52.0)
Hemoglobin: 11.2 g/dL — ABNORMAL LOW (ref 13.0–17.0)
Immature Granulocytes: 0 %
Lymphocytes Relative: 12 %
Lymphs Abs: 0.8 10*3/uL (ref 0.7–4.0)
MCH: 29.2 pg (ref 26.0–34.0)
MCHC: 32.9 g/dL (ref 30.0–36.0)
MCV: 88.8 fL (ref 80.0–100.0)
Monocytes Absolute: 0.5 10*3/uL (ref 0.1–1.0)
Monocytes Relative: 7 %
Neutro Abs: 5 10*3/uL (ref 1.7–7.7)
Neutrophils Relative %: 75 %
Platelets: 193 10*3/uL (ref 150–400)
RBC: 3.83 MIL/uL — ABNORMAL LOW (ref 4.22–5.81)
RDW: 16.9 % — ABNORMAL HIGH (ref 11.5–15.5)
WBC: 6.7 10*3/uL (ref 4.0–10.5)
nRBC: 0 % (ref 0.0–0.2)

## 2021-07-05 LAB — URINE CULTURE: Culture: NO GROWTH

## 2021-07-05 LAB — COMPREHENSIVE METABOLIC PANEL
ALT: 78 U/L — ABNORMAL HIGH (ref 0–44)
AST: 95 U/L — ABNORMAL HIGH (ref 15–41)
Albumin: 2.3 g/dL — ABNORMAL LOW (ref 3.5–5.0)
Alkaline Phosphatase: 27 U/L — ABNORMAL LOW (ref 38–126)
Anion gap: 9 (ref 5–15)
BUN: 126 mg/dL — ABNORMAL HIGH (ref 8–23)
CO2: 20 mmol/L — ABNORMAL LOW (ref 22–32)
Calcium: 8.6 mg/dL — ABNORMAL LOW (ref 8.9–10.3)
Chloride: 119 mmol/L — ABNORMAL HIGH (ref 98–111)
Creatinine, Ser: 5.95 mg/dL — ABNORMAL HIGH (ref 0.61–1.24)
GFR, Estimated: 9 mL/min — ABNORMAL LOW (ref >60)
Glucose, Bld: 126 mg/dL — ABNORMAL HIGH (ref 70–99)
Potassium: 3.7 mmol/L (ref 3.5–5.1)
Sodium: 148 mmol/L — ABNORMAL HIGH (ref 135–145)
Total Bilirubin: 0.9 mg/dL (ref 0.3–1.2)
Total Protein: 5 g/dL — ABNORMAL LOW (ref 6.5–8.1)

## 2021-07-05 LAB — CBG MONITORING, ED
Glucose-Capillary: 114 mg/dL — ABNORMAL HIGH (ref 70–99)
Glucose-Capillary: 119 mg/dL — ABNORMAL HIGH (ref 70–99)
Glucose-Capillary: 131 mg/dL — ABNORMAL HIGH (ref 70–99)
Glucose-Capillary: 92 mg/dL (ref 70–99)
Glucose-Capillary: 98 mg/dL (ref 70–99)

## 2021-07-05 LAB — MAGNESIUM: Magnesium: 1.9 mg/dL (ref 1.7–2.4)

## 2021-07-05 MED ORDER — SODIUM BICARBONATE 8.4 % IV SOLN: INTRAVENOUS | Status: DC

## 2021-07-05 MED ORDER — MIRABEGRON ER 25 MG PO TB24
50.0000 mg | ORAL_TABLET | Freq: Every day | ORAL | Status: DC
  Administered 2021-07-05 – 2021-07-06 (×2): 50 mg via ORAL
  Filled 2021-07-05: qty 2
  Filled 2021-07-05: qty 1

## 2021-07-05 MED ORDER — MEMANTINE HCL 10 MG PO TABS: 10.0000 mg | ORAL_TABLET | Freq: Two times a day (BID) | ORAL | Status: DC

## 2021-07-05 MED ORDER — MEMANTINE HCL 10 MG PO TABS
10.0000 mg | ORAL_TABLET | Freq: Two times a day (BID) | ORAL | Status: DC
  Administered 2021-07-05 – 2021-07-08 (×6): 10 mg via ORAL
  Filled 2021-07-05 (×8): qty 1

## 2021-07-05 MED ORDER — SODIUM BICARBONATE 8.4 % IV SOLN
INTRAVENOUS | Status: DC
  Filled 2021-07-05 (×5): qty 1000

## 2021-07-05 MED ORDER — TAMSULOSIN HCL 0.4 MG PO CAPS
0.4000 mg | ORAL_CAPSULE | Freq: Every day | ORAL | Status: DC
  Administered 2021-07-05 – 2021-07-07 (×3): 0.4 mg via ORAL
  Filled 2021-07-05 (×3): qty 1

## 2021-07-05 NOTE — ED Notes (Addendum)
Pt attempted to get out of bed, pt is requiring increasing reorientation. Pt is receptive to reorientation, but unable to retain information once nurse exits room. Nurse to administer PRN medication for agitation.

## 2021-07-05 NOTE — ED Notes (Signed)
Meal tray brought to pt room 45

## 2021-07-05 NOTE — ED Notes (Signed)
Family updated as to patient's status, at bedside.

## 2021-07-05 NOTE — Progress Notes (Signed)
Patient ID: Eric Krueger, male   DOB: Sep 27, 1946, 75 y.o.   MRN: [id number] [name] Progress Note   Assessment/ Plan:   1. Acute kidney Injury on chronic kidney disease stage IIIa (baseline creatinine about 1.4).:  Polyuric overnight with etiology suspected to be from obstruction and relative hypotension.  His obstruction has been alleviated and he has been placed on intravenous fluids with robust improvement of renal function as well as urine output.  Will adjust intravenous fluids to a more hypotonic solution as polyuria appears to be primarily water loss.  No acute indications for dialysis and recommend continued follow-up as an outpatient with his primary care provider/urology. 2.  Hyperkalemia: Corrected with temporizing measures in the emergency room and definitive management with alleviation of urine obstruction. 3.  Hypernatremia: Secondary to polyuria and preferential water losses, switch fluids to hypotonic. 4.  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  Improving renal function following alleviation of obstruction, nephrology service will sign off and remain available for questions or concerns  Subjective:   Reports to be feeling better, denies any chest pain or shortness of breath.  Wife at bedside raises concern about indwelling Foley catheter/urology appointment.   Objective:   BP (!) 116/53   Pulse (!) 58   Temp 97.6 F (36.4 C) (Oral)   Resp 16   Ht '6\' 2"'$  (1.88 m)   Wt 74.8 kg   SpO2 98%   BMI 21.18 kg/m   Intake/Output Summary (Last 24 hours) at 07/05/2021 1147 Last data filed at 07/05/2021 [id number] Gross per 24 hour  Intake 500 ml  Output 5850 ml  Net -5350 ml   Weight change:   Physical Exam: Gen: Comfortably resting in bed, wife at bedside CVS: Pulse regular bradycardia, S1 and S2 normal Resp: Clear to  auscultation bilaterally, no rales/rhonchi Abd: Soft, flat, nontender, bowel sounds normal Ext: 2+ pretibial edema-nonpitting and appears to be consistent with lymphedema  Imaging: US RENAL  Result Date: 07/04/2021 CLINICAL DATA:  Acute renal failure. EXAM: RENAL / URINARY TRACT ULTRASOUND COMPLETE COMPARISON:  None. FINDINGS: Right Kidney: Renal measurements: 12.4 x 5.8 x 5.6 cm = volume: 210 mL. Moderate hydronephrosis. Small amount of perinephric fluid. No calculi identified. Left Kidney: Renal measurements: 11.1 x 5.9 x 4.7 cm = volume: 161 mL. Mild hydronephrosis. No calculi Bladder: Foley catheter within the bladder. Other: None. IMPRESSION: 1. Bilateral mild-to-moderate hydronephrosis without obstructing lesion identified. 2. Bladder collapsed about Foley catheter. Electronically Signed   By: Suzy Bouchard M.D.   On: 07/04/2021 07:44   DG Chest Port 1 View  Result Date: 07/04/2021 CLINICAL DATA:  Weakness, confusion, failure to thrive EXAM: PORTABLE CHEST 1 VIEW COMPARISON:  None. FINDINGS: Lungs are clear.  No pleural effusion or pneumothorax. Cardiomegaly.  Left subclavian ICD. IMPRESSION: Cardiomegaly. No evidence of acute cardiopulmonary disease. Electronically Signed   By: Julian Hy M.D.   On: 07/04/2021 02:18    Labs: BMET Recent Labs  Lab 07/04/21 0205 07/04/21 0557 07/04/21 1412 07/04/21 1716 07/05/21 0500  NA 139 141 144 147* 148*  K >6.2* 6.4* 5.0 5.0 3.7  CL 111 113* 116* 117* 119*  CO2 14*  --  16* 18* 20*  GLUCOSE 141* 66* 133* 108* 126*  BUN 257* >130* 178* 176* 126*  CREATININE 12.66* 13.20* 9.51* 8.97* 5.95*  CALCIUM 9.0  --  8.4* 9.0 8.6*  CBC Recent Labs  Lab 07/04/21 0205 07/04/21 0557 07/05/21 0500  WBC 6.4  --  6.7  NEUTROABS 5.4  --  5.0  HGB 12.4* 10.2* 11.2*  HCT 37.9* 30.0* 34.0*  MCV 90.9  --  88.8  PLT 191  --  193    Medications:       Elmarie Shiley, MD 07/05/2021, 11:47 AM

## 2021-07-05 NOTE — ED Notes (Addendum)
Pt reoriented. Pt is calm, cooperative, and to follow directions, but requiring frequent reorientation to place, situation, and time.    Urine output continues to be red and clear.

## 2021-07-05 NOTE — ED Notes (Signed)
Got patient back in bed off the bedside toilet patient is hooked back up to the monitor with call bell in reach and family at bedside

## 2021-07-05 NOTE — ED Notes (Signed)
Pt attempted to get out of bed, confusion continues, redirected. PRN administered.

## 2021-07-05 NOTE — Progress Notes (Signed)
Patient ID: Eric Krueger, male   DOB: 1946/06/07, 75 y.o.   MRN: [id number]  PROGRESS NOTE    Eric Krueger  [id number] DOB: 02-06-1946 DOA: 07/04/2021 PCP: Joneen Boers, MD   Brief Narrative:  75 y.o. 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 tach status post defibrillator placement presented with weakness and poor oral intake.  On presentation, patient was hypothermic with potassium of 6.2 and creatinine of 12.6.  Nephrology and PCCM were consulted.  He was started on bicarb drip.  Foley catheter was placed.  Assessment & Plan:   Acute renal failure with hyperkalemia Acute metabolic acidosis -Probably obstructive in nature.  Renal ultrasound showed bilateral mild to moderate hydronephrosis without obstructing lesion.  Foley catheter was placed. -Presented with creatinine of 12.6 and potassium of 6.2.  Treated with Lokelma/D50 insulin/sodium bicarb push and calcium gluconate. -Nephrology following.  Currently on bicarb drip.  Creatinine significantly improving to 5.95 with great urine output.  Acidosis improving as well -Repeat a.m. labs.  Will need outpatient urology evaluation and follow-up. -Start Flomax  Hypothermia Possible right lower extremity cellulitis -Possibly from above.  Cultures negative so far.  Patient is currently on empiric antibiotics.  Hematuria -Possibly from trauma from Foley catheter insertion.  Improving.  Monitor.  Eliquis on hold.  Hemoglobin stable.  Paroxysmal A. Fib History of pulmonary embolism -Currently intermittently bradycardic.  Eliquis on hold because of hematuria.  History of V. tach/sinus node dysfunction with complete heart block status post pacemaker/defibrillator placement -Outpatient follow-up with cardiology  Elevated LFTs -Questionable cause.  Improving.  Monitor  History of dementia -Continue Namenda.  Fall  precautions.  PT eval. -Palliative care evaluation appreciated.  Patient remains full code.  DVT prophylaxis: Start SCDs Code Status: Full Family Communication: Wife at bedside on 07/06/2019.  Spoke to son on phone on 07/04/2021.  Tried calling son again on 07/05/2021 without success. Disposition Plan: Status is: Inpatient  Remains inpatient appropriate because:Inpatient level of care appropriate due to severity of illness  Dispo: The patient is from: Home              Anticipated d/c is to: Home              Patient currently is not medically stable to d/c.   Difficult to place patient No  Consultants: Nephrology/PCCM/palliative care  Procedures: None  Antimicrobials: Cefepime   Subjective: Patient seen and examined at bedside.  More awake this morning and answer some basic questions.  Still slightly confused.  Wife at bedside.  Oral intake improving.  No overnight fever or vomiting reported.  Objective: Vitals:   07/05/21 1015 07/05/21 1030 07/05/21 1100 07/05/21 1130  BP: (!) 106/48 (!) 101/52 (!) 102/48 (!) 116/53  Pulse: 60 (!) 59 (!) 59 (!) 58  Resp: '14 15 11 16  '$ Temp:      TempSrc:      SpO2: 96% 96% 98% 98%  Weight:      Height:        Intake/Output Summary (Last 24 hours) at 07/05/2021 1204 Last data filed at 07/05/2021 [id number] Gross per 24 hour  Intake 500 ml  Output 5850 ml  Net -5350 ml   Filed Weights   07/04/21 0430  Weight: 74.8 kg    Examination:  General exam: Appears calm and comfortable.  Currently on room air. Respiratory system: Bilateral decreased breath sounds at bases with some scattered crackles Cardiovascular  system: S1 & S2 heard, bradycardic intermittently gastrointestinal system: Abdomen is nondistended, soft and nontender. Normal bowel sounds heard. Extremities: No cyanosis, clubbing; bilateral lower extremity edema present  Central nervous system: More awake today but still confused.  No focal neurological deficits. Moving  extremities Skin: Chronic lower extremity skin changes with right lower extremity erythema present  psychiatry: Cannot be assessed because of mental status Genitourinary: Foley catheter present with amber urine    Data Reviewed: I have personally reviewed following labs and imaging studies  CBC: Recent Labs  Lab 07/04/21 0205 07/04/21 0557 07/05/21 0500  WBC 6.4  --  6.7  NEUTROABS 5.4  --  5.0  HGB 12.4* 10.2* 11.2*  HCT 37.9* 30.0* 34.0*  MCV 90.9  --  88.8  PLT 191  --  0000000   Basic Metabolic Panel: Recent Labs  Lab 07/04/21 0205 07/04/21 0557 07/04/21 1412 07/04/21 1716 07/05/21 0500  NA 139 141 144 147* 148*  K >6.2* 6.4* 5.0 5.0 3.7  CL 111 113* 116* 117* 119*  CO2 14*  --  16* 18* 20*  GLUCOSE 141* 66* 133* 108* 126*  BUN 257* >130* 178* 176* 126*  CREATININE 12.66* 13.20* 9.51* 8.97* 5.95*  CALCIUM 9.0  --  8.4* 9.0 8.6*  MG  --   --   --   --  1.9   GFR: Estimated Creatinine Clearance: 11 mL/min (A) (by C-G formula based on SCr of 5.95 mg/dL (H)). Liver Function Tests: Recent Labs  Lab 07/04/21 0205 07/05/21 0500  AST 77* 95*  ALT 80* 78*  ALKPHOS 35* 27*  BILITOT 1.3* 0.9  PROT 6.2* 5.0*  ALBUMIN 3.0* 2.3*   No results for input(s): LIPASE, AMYLASE in the last 168 hours. No results for input(s): AMMONIA in the last 168 hours. Coagulation Profile: Recent Labs  Lab 07/04/21 0205  INR 3.7*   Cardiac Enzymes: No results for input(s): CKTOTAL, CKMB, CKMBINDEX, TROPONINI in the last 168 hours. BNP (last 3 results) No results for input(s): PROBNP in the last 8760 hours. HbA1C: No results for input(s): HGBA1C in the last 72 hours. CBG: Recent Labs  Lab 07/04/21 1707 07/04/21 2004 07/05/21 0056 07/05/21 0452 07/05/21 0904  GLUCAP 97 108* 119* 114* 98   Lipid Profile: No results for input(s): CHOL, HDL, LDLCALC, TRIG, CHOLHDL, LDLDIRECT in the last 72 hours. Thyroid Function Tests: Recent Labs    07/04/21 0647  TSH 4.076   Anemia  Panel: No results for input(s): VITAMINB12, FOLATE, FERRITIN, TIBC, IRON, RETICCTPCT in the last 72 hours. Sepsis Labs: Recent Labs  Lab 07/04/21 0158 07/04/21 0647  LATICACIDVEN 1.2 1.7    Recent Results (from the past 240 hour(s))  Blood Culture (routine x 2)     Status: None (Preliminary result)   Collection Time: 07/04/21  1:55 AM   Specimen: BLOOD  Result Value Ref Range Status   Specimen Description   Final    BLOOD LEFT ANTECUBITAL Performed at [facility] [address], [place], [place] [address]    Special Requests   Final    BOTTLES DRAWN AEROBIC AND ANAEROBIC Blood Culture adequate volume Performed at [facility] [address] [place], [place] [address]    Culture   Final    NO GROWTH < 24 HOURS Performed at [facility] [address], [place], [place] [address]    Report Status PENDING  Incomplete  Resp Panel by RT-PCR (Flu A&B, Covid) Nasopharyngeal Swab  Status: None   Collection Time: 07/04/21  1:57 AM   Specimen: Nasopharyngeal Swab; Nasopharyngeal(NP) swabs in vial transport medium  Result Value Ref Range Status   SARS Coronavirus 2 by RT PCR NEGATIVE NEGATIVE Final    Comment: (NOTE) SARS-CoV-2 target nucleic acids are NOT DETECTED.  The SARS-CoV-2 RNA is generally detectable in upper respiratory specimens during the acute phase of infection. The lowest concentration of SARS-CoV-2 viral copies this assay can detect is 138 copies/mL. A negative result does not preclude SARS-Cov-2 infection and should not be used as the sole basis for treatment or other patient management decisions. A negative result may occur with  improper specimen collection/handling, submission of specimen other than nasopharyngeal swab, presence of viral mutation(s) within the areas targeted by this assay, and inadequate number of viral copies(<138 copies/mL). A negative result must be combined with clinical observations,  patient history, and epidemiological information. The expected result is Negative.  Fact Sheet for Patients:  EntrepreneurPulse.com.au  Fact Sheet for Healthcare Providers:  IncredibleEmployment.be  This test is no t yet approved or cleared by the [place] FDA and  has been authorized for detection and/or diagnosis of SARS-CoV-2 by FDA under an Emergency Use Authorization (EUA). This EUA will remain  in effect (meaning this test can be used) for the duration of the COVID-19 declaration under Section 564(b)(1) of the Act, 21 U.S.C.section 360bbb-3(b)(1), unless the authorization is terminated  or revoked sooner.       Influenza A by PCR NEGATIVE NEGATIVE Final   Influenza B by PCR NEGATIVE NEGATIVE Final    Comment: (NOTE) The Xpert Xpress SARS-CoV-2/FLU/RSV plus assay is intended as an aid in the diagnosis of influenza from Nasopharyngeal swab specimens and should not be used as a sole basis for treatment. Nasal washings and aspirates are unacceptable for Xpert Xpress SARS-CoV-2/FLU/RSV testing.  Fact Sheet for Patients: EntrepreneurPulse.com.au  Fact Sheet for Healthcare Providers: IncredibleEmployment.be  This test is not yet approved or cleared by the [place] FDA and has been authorized for detection and/or diagnosis of SARS-CoV-2 by FDA under an Emergency Use Authorization (EUA). This EUA will remain in effect (meaning this test can be used) for the duration of the COVID-19 declaration under Section 564(b)(1) of the Act, 21 U.S.C. section 360bbb-3(b)(1), unless the authorization is terminated or revoked.  Performed at [facility], [place] [address], [place], [place] [address]   Blood Culture (routine x 2)     Status: None (Preliminary result)   Collection Time: 07/04/21  2:03 AM   Specimen: BLOOD  Result Value Ref Range Status   Specimen Description   Final    BLOOD  BLOOD RIGHT FOREARM Performed at [facility] [address] [place], [place] [address]    Special Requests   Final    BOTTLES DRAWN AEROBIC AND ANAEROBIC Blood Culture adequate volume Performed at [facility] [address] [place], [place] [address]    Culture   Final    NO GROWTH < 24 HOURS Performed at [facility] [address] [place], [place] [address]    Report Status PENDING  Incomplete  Urine Culture     Status: None   Collection Time: 07/04/21  6:46 AM   Specimen: In/Out Cath Urine  Result Value Ref Range Status   Specimen Description   Final    IN/OUT CATH URINE Performed at [facility] [address] [place], [place] [address]    Special  Requests   Final    NONE Performed at [facility], [place] [address], [place], [place] [address]    Culture   Final    NO GROWTH Performed at [facility], [place] [address], [place], [place] [address]    Report Status 07/05/2021 FINAL  Final         Radiology Studies: US RENAL  Result Date: 07/04/2021 CLINICAL DATA:  Acute renal failure. EXAM: RENAL / URINARY TRACT ULTRASOUND COMPLETE COMPARISON:  None. FINDINGS: Right Kidney: Renal measurements: 12.4 x 5.8 x 5.6 cm = volume: 210 mL. Moderate hydronephrosis. Small amount of perinephric fluid. No calculi identified. Left Kidney: Renal measurements: 11.1 x 5.9 x 4.7 cm = volume: 161 mL. Mild hydronephrosis. No calculi Bladder: Foley catheter within the bladder. Other: None. IMPRESSION: 1. Bilateral mild-to-moderate hydronephrosis without obstructing lesion identified. 2. Bladder collapsed about Foley catheter. Electronically Signed   By: Suzy Bouchard M.D.   On: 07/04/2021 07:44   DG Chest Port 1 View  Result Date: 07/04/2021 CLINICAL DATA:  Weakness, confusion, failure to thrive EXAM: PORTABLE CHEST 1 VIEW COMPARISON:  None. FINDINGS: Lungs are clear.  No pleural effusion or  pneumothorax. Cardiomegaly.  Left subclavian ICD. IMPRESSION: Cardiomegaly. No evidence of acute cardiopulmonary disease. Electronically Signed   By: Julian Hy M.D.   On: 07/04/2021 02:18        Scheduled Meds: Continuous Infusions:  ceFEPime (MAXIPIME) IV Stopped (07/05/21 1115)   dextrose 5 % and 0.2 % NaCl 1,000 mL with sodium bicarbonate 50 mEq infusion            Aline August, MD [facility] 07/05/2021, 12:04 PM

## 2021-07-05 NOTE — ED Notes (Signed)
Help patient up out the bed to the bedside toilet patient has cal bell in reach and family at bedside

## 2021-07-05 NOTE — ED Notes (Signed)
Pt has been coerced from climbing out of bed, he is very directable. Was repositioned & put on bed pan with his request to have a bowel movement. Son was spoken to on the phone & given an update.

## 2021-07-05 NOTE — ED Notes (Signed)
Bed alarm on.

## 2021-07-05 NOTE — Progress Notes (Signed)
Chart Reviewed  75 yo M with acute renal failure (moderate R hydronephrosis mild L hydronephrosis) on CKD 3a, hyperkalemia, hypovolemia with soft BP for which PCCM was consulted 8/14. Foley placed and bladder decompressed with immediate 712m out. Nephrology consulted, not planning for iHD. Pt BP has stabilized with fluids. Pt has also made good urine -- approx 6.5 L 6/14. K is improved and Cr is downtrending.    PCCM will sign off. Please re-engage if we can be of further assistance or if the patient's clinical status changes.     GEliseo GumMSN, AGACNP-BC LGaffneyfor Pager 07/05/2021, 7:45 AM

## 2021-07-06 LAB — GLUCOSE, CAPILLARY
Glucose-Capillary: 125 mg/dL — ABNORMAL HIGH (ref 70–99)
Glucose-Capillary: 72 mg/dL (ref 70–99)
Glucose-Capillary: 91 mg/dL (ref 70–99)
Glucose-Capillary: 97 mg/dL (ref 70–99)
Glucose-Capillary: 98 mg/dL (ref 70–99)

## 2021-07-06 LAB — COMPREHENSIVE METABOLIC PANEL
ALT: 75 U/L — ABNORMAL HIGH (ref 0–44)
AST: 91 U/L — ABNORMAL HIGH (ref 15–41)
Albumin: 2.2 g/dL — ABNORMAL LOW (ref 3.5–5.0)
Alkaline Phosphatase: 29 U/L — ABNORMAL LOW (ref 38–126)
Anion gap: 6 (ref 5–15)
BUN: 65 mg/dL — ABNORMAL HIGH (ref 8–23)
CO2: 27 mmol/L (ref 22–32)
Calcium: 8.7 mg/dL — ABNORMAL LOW (ref 8.9–10.3)
Chloride: 119 mmol/L — ABNORMAL HIGH (ref 98–111)
Creatinine, Ser: 3.09 mg/dL — ABNORMAL HIGH (ref 0.61–1.24)
GFR, Estimated: 20 mL/min — ABNORMAL LOW (ref >60)
Glucose, Bld: 110 mg/dL — ABNORMAL HIGH (ref 70–99)
Potassium: 3.5 mmol/L (ref 3.5–5.1)
Sodium: 152 mmol/L — ABNORMAL HIGH (ref 135–145)
Total Bilirubin: 0.6 mg/dL (ref 0.3–1.2)
Total Protein: 5.2 g/dL — ABNORMAL LOW (ref 6.5–8.1)

## 2021-07-06 LAB — CBC WITH DIFFERENTIAL/PLATELET
Abs Immature Granulocytes: 0.02 10*3/uL (ref 0.00–0.07)
Basophils Absolute: 0.1 10*3/uL (ref 0.0–0.1)
Basophils Relative: 1 %
Eosinophils Absolute: 0.5 10*3/uL (ref 0.0–0.5)
Eosinophils Relative: 8 %
HCT: 33.7 % — ABNORMAL LOW (ref 39.0–52.0)
Hemoglobin: 11.1 g/dL — ABNORMAL LOW (ref 13.0–17.0)
Immature Granulocytes: 0 %
Lymphocytes Relative: 14 %
Lymphs Abs: 0.9 10*3/uL (ref 0.7–4.0)
MCH: 29.5 pg (ref 26.0–34.0)
MCHC: 32.9 g/dL (ref 30.0–36.0)
MCV: 89.6 fL (ref 80.0–100.0)
Monocytes Absolute: 0.5 10*3/uL (ref 0.1–1.0)
Monocytes Relative: 8 %
Neutro Abs: 4.4 10*3/uL (ref 1.7–7.7)
Neutrophils Relative %: 69 %
Platelets: 175 10*3/uL (ref 150–400)
RBC: 3.76 MIL/uL — ABNORMAL LOW (ref 4.22–5.81)
RDW: 17 % — ABNORMAL HIGH (ref 11.5–15.5)
WBC: 6.3 10*3/uL (ref 4.0–10.5)
nRBC: 0 % (ref 0.0–0.2)

## 2021-07-06 LAB — MAGNESIUM: Magnesium: 1.5 mg/dL — ABNORMAL LOW (ref 1.7–2.4)

## 2021-07-06 MED ORDER — DEXTROSE 5 % IV SOLN: INTRAVENOUS | Status: DC

## 2021-07-06 MED ORDER — CHLORHEXIDINE GLUCONATE CLOTH 2 % EX PADS
6.0000 | MEDICATED_PAD | Freq: Every day | CUTANEOUS | Status: DC
  Administered 2021-07-07 – 2021-07-08 (×2): 6 via TOPICAL

## 2021-07-06 MED ORDER — SODIUM CHLORIDE 0.9 % IV SOLN
2.0000 g | Freq: Every day | INTRAVENOUS | Status: DC
  Filled 2021-07-06: qty 2

## 2021-07-06 NOTE — Progress Notes (Signed)
  Speech Language Pathology Treatment: Dysphagia  Patient Details Name: Eric Krueger MRN: [id number] DOB: February 07, 1946 Today's Date: 07/06/2021 Time: 7741-4239 SLP Time Calculation (min) (ACUTE ONLY): 15 min  Assessment / Plan / Recommendation Clinical Impression  Pt was seen at bedside for follow up after BSE completed. Wife present, and reports no difficulty swallowing. Pt was sleeping upon arrival of SLP, but awakened easily with verbal stim. Pt was agreeable to sit up, and accepted trials of thin liquid, puree, and solid texture. No difficulty observed or reported by patient. No overt s/s aspiration following any texture. ST will sign off at this point. Please reconsult if needs arise.     HPI HPI: Patient is a 75 y.o. 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 tach status post defibrillator placement, dementia. He was brought to ER after getting progressively weaker, having poor oral intake and increased confusion over the past two weeks. In ER, he was hypotheric with temperature 94 degrees F, labs showing increased creatinine, COVID test negative, EKG normal. Lungs clear per CXR. Patient was admitted for acute renal failure with hypothermia, poor oral intake and FTT.      SLP Plan  Discharge SLP treatment due to All goals met       Recommendations  Diet recommendations: Regular;Thin liquid Liquids provided via: Cup;Straw Medication Administration: Whole meds with puree Supervision: Patient able to self feed Compensations: Slow rate;Minimize environmental distractions;Small sips/bites Postural Changes and/or Swallow Maneuvers: Seated upright 90 degrees;Upright 30-60 min after meal                Oral Care Recommendations: Oral care BID;Staff/trained caregiver to provide oral care Follow up Recommendations: None SLP Visit Diagnosis: Dysphagia, unspecified (R13.10) Plan: All  goals met;Discharge SLP treatment due to (comment)       GO               Rafael Quesada B. Quentin Ore, [facility], [place] Speech Language Pathologist Office: [phone]  Shonna Chock 07/06/2021, 12:44 PM

## 2021-07-06 NOTE — Progress Notes (Signed)
Pharmacy Antibiotic Note  Eric Krueger is a 75 y.o. male admitted on 07/04/2021 with sepsis.  Pharmacy has been consulted for cefepime dosing.  SCr has improved somewhat from 5.95>>3.09. CrCl ~21. Will adjust cefepime  Plan: Increase Cefepime 2 gm iv Q24H  Monitor for improved renal function and for clinical course F/u for deescalation   Height: '6\' 2"'$  (188 cm) Weight: 74.8 kg (165 lb) IBW/kg (Calculated) : 82.2  Temp (24hrs), Avg:97.7 F (36.5 C), Min:97.6 F (36.4 C), Max:97.9 F (36.6 C)  Recent Labs  Lab 07/04/21 0158 07/04/21 0205 07/04/21 0205 07/04/21 0557 07/04/21 0647 07/04/21 1412 07/04/21 1716 07/05/21 0500 07/06/21 0347  WBC  --  6.4  --   --   --   --   --  6.7 6.3  CREATININE  --  12.66*   < > 13.20*  --  9.51* 8.97* 5.95* 3.09*  LATICACIDVEN 1.2  --   --   --  1.7  --   --   --   --    < > = values in this interval not displayed.     Estimated Creatinine Clearance: 21.2 mL/min (A) (by C-G formula based on SCr of 3.09 mg/dL (H)).    Allergies  Allergen Reactions   Naproxen Sodium     Other reaction(s): Other (See Comments) Does not take per cardiologist   Other     Other reaction(s): Redness Other reaction(s): Other (See Comments) Other reaction(s): Redness   Adhesive [Tape]    Aspirin     Other reaction(s): Other (See Comments)   Ezetimibe-Simvastatin     myalgias Other reaction(s): Other (See Comments) myalgias   Latex     Other reaction(s): Other (See Comments)   Nsaids     Other reaction(s): Other (See Comments) Cannot take d/t defibrillator/pacemaker   Pedi-Pre Tape Spray [Wound Dressing Adhesive]     Other reaction(s): Other (See Comments)   Saw [place] Diarrhea    Antimicrobials this admission: 8/14 cefepime  >>      Dose adjustments this admission:   Microbiology results: 8/14 BCx: NGTD 8/14 UCx:  NGF    Patrice Moates A. Levada Dy, PharmD, BCPS, FNKF Clinical Pharmacist [place] Please utilize Amion for appropriate phone  number to reach the unit pharmacist ([place])  07/06/2021 8:11 AM

## 2021-07-06 NOTE — Progress Notes (Addendum)
Patient ID: Eric Krueger, male   DOB: 1946/10/26, 75 y.o.   MRN: [id number]  PROGRESS NOTE    Eric Krueger  [id number] DOB: 1946/06/18 DOA: 07/04/2021 PCP: Joneen Boers, MD   Brief Narrative:  74 y.o. 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 tach status post defibrillator placement presented with weakness and poor oral intake.  On presentation, patient was hypothermic with potassium of 6.2 and creatinine of 12.6.  Nephrology and PCCM were consulted.  He was started on bicarb drip.  Foley catheter was placed.  Assessment & Plan:   Acute renal failure with hyperkalemia Acute metabolic acidosis -Probably obstructive in nature.  Renal ultrasound showed bilateral mild to moderate hydronephrosis without obstructing lesion.  Foley catheter was placed. -Presented with creatinine of 12.6 and potassium of 6.2.  Treated with Lokelma/D50 insulin/sodium bicarb push and calcium gluconate. -Nephrology signed off on 07/05/21.    -Creatinine significantly improving to 3.09 with great urine output.  Acidosis as resolved. Switch iv fluids to D5 at 75 cc/hr -Repeat a.m. labs.  Family is requesting inpatient urology evaluation.  We will request urology evaluation -Continue Flomax  Hypothermia Possible right lower extremity cellulitis -Possibly from above.  Cultures negative so far.  Patient is currently on empiric antibiotics.  Right lower extremity redness has much improved.  Possible DC antibiotics tomorrow.  Hematuria -Possibly from trauma from Foley catheter insertion.  Improving.  Monitor.  Eliquis on hold.  Hemoglobin stable.  Might be able to resume Eliquis in the next 1 to 2 days.  Hyponatremia -Monitor.  Switch IV fluids as above.  Paroxysmal A. Fib History of pulmonary embolism -Currently intermittently bradycardic.  Eliquis on hold because of hematuria.  History of V.  tach/sinus node dysfunction with complete heart block status post pacemaker/defibrillator placement -Outpatient follow-up with cardiology  Elevated LFTs -Questionable cause.  Improving.  Monitor  History of dementia -Continue Namenda.  Fall precautions.  PT eval. -Palliative care evaluation appreciated.  Patient remains full code.  DVT prophylaxis: Start SCDs Code Status: Full Family Communication: Wife at bedside on 07/06/2021.  Spoke to son/Eric Krueger on 07/06/2021 on phone. Disposition Plan: Status is: Inpatient  Remains inpatient appropriate because:Inpatient level of care appropriate due to severity of illness  Dispo: The patient is from: Home              Anticipated d/c is to: Home              Patient currently is not medically stable to d/c.   Difficult to place patient No  Consultants: Nephrology/PCCM/palliative care  Procedures: None  Antimicrobials: Cefepime   Subjective: Patient seen and examined at bedside.  Awake but still confused.  No fever, vomiting, worsening shortness of breath reported.  Objective: Vitals:   07/06/21 0000 07/06/21 0100 07/06/21 0213 07/06/21 0531  BP: (!) 124/56 (!) 125/56  (!) 116/55  Pulse: 61 61  63  Resp: '16 12  16  '$ Temp:   97.6 F (36.4 C) 97.7 F (36.5 C)  TempSrc:   Oral Oral  SpO2: 97% 97%    Weight:      Height:        Intake/Output Summary (Last 24 hours) at 07/06/2021 0719 Last data filed at 07/06/2021 0030 Gross per 24 hour  Intake --  Output 3100 ml  Net -3100 ml    Filed Weights   07/04/21 0430  Weight: 74.8 kg  Examination:  General exam: On room air currently.  No distress.   Respiratory system: Decreased breath sounds at bases bilaterally with scattered crackles  cardiovascular system: Currently rate controlled; S1-S2 heard  gastrointestinal system: Abdomen is distended slightly, soft and nontender.  Bowel sounds are heard Extremities: Trace lower extremity edema present bilaterally; no clubbing Central  nervous system: Awake but still slightly confused.  No focal neurological deficits.  Moves extremities Skin: Chronic lower extremity skin changes with right lower extremity erythema present.  No other ecchymosis/rashes psychiatry: Could not be assessed because of mental status Genitourinary: Foley catheter still present with amber urine    Data Reviewed: I have personally reviewed following labs and imaging studies  CBC: Recent Labs  Lab 07/04/21 0205 07/04/21 0557 07/05/21 0500 07/06/21 0347  WBC 6.4  --  6.7 6.3  NEUTROABS 5.4  --  5.0 4.4  HGB 12.4* 10.2* 11.2* 11.1*  HCT 37.9* 30.0* 34.0* 33.7*  MCV 90.9  --  88.8 89.6  PLT 191  --  193 0000000    Basic Metabolic Panel: Recent Labs  Lab 07/04/21 0205 07/04/21 0557 07/04/21 1412 07/04/21 1716 07/05/21 0500 07/06/21 0347  NA 139 141 144 147* 148* 152*  K >6.2* 6.4* 5.0 5.0 3.7 3.5  CL 111 113* 116* 117* 119* 119*  CO2 14*  --  16* 18* 20* 27  GLUCOSE 141* 66* 133* 108* 126* 110*  BUN 257* >130* 178* 176* 126* 65*  CREATININE 12.66* 13.20* 9.51* 8.97* 5.95* 3.09*  CALCIUM 9.0  --  8.4* 9.0 8.6* 8.7*  MG  --   --   --   --  1.9 1.5*    GFR: Estimated Creatinine Clearance: 21.2 mL/min (A) (by C-G formula based on SCr of 3.09 mg/dL (H)). Liver Function Tests: Recent Labs  Lab 07/04/21 0205 07/05/21 0500 07/06/21 0347  AST 77* 95* 91*  ALT 80* 78* 75*  ALKPHOS 35* 27* 29*  BILITOT 1.3* 0.9 0.6  PROT 6.2* 5.0* 5.2*  ALBUMIN 3.0* 2.3* 2.2*    No results for input(s): LIPASE, AMYLASE in the last 168 hours. No results for input(s): AMMONIA in the last 168 hours. Coagulation Profile: Recent Labs  Lab 07/04/21 0205  INR 3.7*    Cardiac Enzymes: No results for input(s): CKTOTAL, CKMB, CKMBINDEX, TROPONINI in the last 168 hours. BNP (last 3 results) No results for input(s): PROBNP in the last 8760 hours. HbA1C: No results for input(s): HGBA1C in the last 72 hours. CBG: Recent Labs  Lab 07/05/21 0056  07/05/21 0452 07/05/21 0904 07/05/21 1231 07/05/21 1932  GLUCAP 119* 114* 98 92 131*    Lipid Profile: No results for input(s): CHOL, HDL, LDLCALC, TRIG, CHOLHDL, LDLDIRECT in the last 72 hours. Thyroid Function Tests: Recent Labs    07/04/21 0647  TSH 4.076    Anemia Panel: No results for input(s): VITAMINB12, FOLATE, FERRITIN, TIBC, IRON, RETICCTPCT in the last 72 hours. Sepsis Labs: Recent Labs  Lab 07/04/21 0158 07/04/21 0647  LATICACIDVEN 1.2 1.7     Recent Results (from the past 240 hour(s))  Blood Culture (routine x 2)     Status: None (Preliminary result)   Collection Time: 07/04/21  1:55 AM   Specimen: BLOOD  Result Value Ref Range Status   Specimen Description   Final    BLOOD LEFT ANTECUBITAL Performed at [facility] [address], [place], [place] [address]    Special Requests   Final    BOTTLES DRAWN AEROBIC AND ANAEROBIC  Blood Culture adequate volume Performed at [facility] [address], [place], [place] [address]    Culture   Final    NO GROWTH < 24 HOURS Performed at [facility] [address] [place], [place] [address]    Report Status PENDING  Incomplete  Resp Panel by RT-PCR (Flu A&B, Covid) Nasopharyngeal Swab     Status: None   Collection Time: 07/04/21  1:57 AM   Specimen: Nasopharyngeal Swab; Nasopharyngeal(NP) swabs in vial transport medium  Result Value Ref Range Status   SARS Coronavirus 2 by RT PCR NEGATIVE NEGATIVE Final    Comment: (NOTE) SARS-CoV-2 target nucleic acids are NOT DETECTED.  The SARS-CoV-2 RNA is generally detectable in upper respiratory specimens during the acute phase of infection. The lowest concentration of SARS-CoV-2 viral copies this assay can detect is 138 copies/mL. A negative result does not preclude SARS-Cov-2 infection and should not be used as the sole basis for treatment or other patient management decisions. A negative result may occur with   improper specimen collection/handling, submission of specimen other than nasopharyngeal swab, presence of viral mutation(s) within the areas targeted by this assay, and inadequate number of viral copies(<138 copies/mL). A negative result must be combined with clinical observations, patient history, and epidemiological information. The expected result is Negative.  Fact Sheet for Patients:  EntrepreneurPulse.com.au  Fact Sheet for Healthcare Providers:  IncredibleEmployment.be  This test is no t yet approved or cleared by the [place] FDA and  has been authorized for detection and/or diagnosis of SARS-CoV-2 by FDA under an Emergency Use Authorization (EUA). This EUA will remain  in effect (meaning this test can be used) for the duration of the COVID-19 declaration under Section 564(b)(1) of the Act, 21 U.S.C.section 360bbb-3(b)(1), unless the authorization is terminated  or revoked sooner.       Influenza A by PCR NEGATIVE NEGATIVE Final   Influenza B by PCR NEGATIVE NEGATIVE Final    Comment: (NOTE) The Xpert Xpress SARS-CoV-2/FLU/RSV plus assay is intended as an aid in the diagnosis of influenza from Nasopharyngeal swab specimens and should not be used as a sole basis for treatment. Nasal washings and aspirates are unacceptable for Xpert Xpress SARS-CoV-2/FLU/RSV testing.  Fact Sheet for Patients: EntrepreneurPulse.com.au  Fact Sheet for Healthcare Providers: IncredibleEmployment.be  This test is not yet approved or cleared by the [place] FDA and has been authorized for detection and/or diagnosis of SARS-CoV-2 by FDA under an Emergency Use Authorization (EUA). This EUA will remain in effect (meaning this test can be used) for the duration of the COVID-19 declaration under Section 564(b)(1) of the Act, 21 U.S.C. section 360bbb-3(b)(1), unless the authorization is terminated  or revoked.  Performed at [facility], [place] [address], [place], [place] [address]   Blood Culture (routine x 2)     Status: None (Preliminary result)   Collection Time: 07/04/21  2:03 AM   Specimen: BLOOD  Result Value Ref Range Status   Specimen Description   Final    BLOOD BLOOD RIGHT FOREARM Performed at [facility] [address] [place], [place] [address]    Special Requests   Final    BOTTLES DRAWN AEROBIC AND ANAEROBIC Blood Culture adequate volume Performed at [facility] [address], [place], [place] [address]    Culture   Final    NO GROWTH < 24 HOURS Performed at [facility] [address] [place], [place] [address]  Report Status PENDING  Incomplete  Urine Culture     Status: None   Collection Time: 07/04/21  6:46 AM   Specimen: In/Out Cath Urine  Result Value Ref Range Status   Specimen Description   Final    IN/OUT CATH URINE Performed at [facility], [place] [address] [place], [place] [address]    Special Requests   Final    NONE Performed at [facility], [place] [address], [place], [place] [address]    Culture   Final    NO GROWTH Performed at [facility], [place] [address], [place],  [address]    Report Status 07/05/2021 FINAL  Final          Radiology Studies: US RENAL  Result Date: 07/04/2021 CLINICAL DATA:  Acute renal failure. EXAM: RENAL / URINARY TRACT ULTRASOUND COMPLETE COMPARISON:  None. FINDINGS: Right Kidney: Renal measurements: 12.4 x 5.8 x 5.6 cm = volume: 210 mL. Moderate hydronephrosis. Small amount of perinephric fluid. No calculi identified. Left Kidney: Renal measurements: 11.1 x 5.9 x 4.7 cm = volume: 161 mL. Mild hydronephrosis. No calculi Bladder: Foley catheter within the bladder. Other: None. IMPRESSION: 1. Bilateral mild-to-moderate hydronephrosis without obstructing lesion identified. 2. Bladder collapsed  about Foley catheter. Electronically Signed   By: Suzy Bouchard M.D.   On: 07/04/2021 07:44        Scheduled Meds:  memantine  10 mg Oral BID   mirabegron ER  50 mg Oral Daily   tamsulosin  0.4 mg Oral QHS   Continuous Infusions:  ceFEPime (MAXIPIME) IV Stopped (07/05/21 1115)   dextrose 5 %-0.22% NaCl with KCl/Additives Pediatric custom IV fluid 100 mL/hr at 07/05/21 2244          Aline August, MD [facility] 07/06/2021, 7:19 AM

## 2021-07-06 NOTE — Evaluation (Signed)
Physical Therapy Evaluation Patient Details Name: Eric Krueger MRN: [id number] DOB: 1946-04-28 Today's Date: 07/06/2021   History of Present Illness  Eric Krueger is a 75 y.o. 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 tach status post defibrillator placement was brought to the ER after patient was progressively getting weaker poor oral intake increased confusion over the last 2 weeks.  Patient wife was providing the history states that patient has been having some urinary incontinence and difficulty urinating for which patient was placed on tamsulosin by patient's urologist recently.  His urinary symptoms improved but patient has been having poor oral intake and progressively getting weaker.  So patient's wife decided to bring him to the ER.   Clinical Impression  Patient received in bed, supine. He is agreeable to PT assessment. Wife present for evaluation. He requires min assist for supine to sit. Min assist for sit to stand and min guard for ambulation in hallway with RW. He will continue to benefit from skilled PT while here to improve strength and functional independence to prevent falls.        Follow Up Recommendations Home health PT    Equipment Recommendations  Rolling walker with 5" wheels    Recommendations for Other Services       Precautions / Restrictions Precautions Precautions: Fall Restrictions Weight Bearing Restrictions: No      Mobility  Bed Mobility Overal bed mobility: Needs Assistance Bed Mobility: Supine to Sit;Sit to Supine     Supine to sit: Min assist Sit to supine: Supervision        Transfers Overall transfer level: Needs assistance Equipment used: Rolling walker (2 wheeled) Transfers: Sit to/from Stand Sit to Stand: Min guard         General transfer comment: Cues for pushing up from  bed  Ambulation/Gait Ambulation/Gait assistance: Min guard Gait Distance (Feet): 125 Feet Assistive device: Rolling walker (2 wheeled) Gait Pattern/deviations: Step-through pattern;Decreased step length - right;Decreased step length - left;Narrow base of support;Decreased stride length Gait velocity: decr   General Gait Details: generally safe with mobility, slightly unsteady with turning  Stairs            Wheelchair Mobility    Modified Rankin (Stroke Patients Only)       Balance Overall balance assessment: Needs assistance Sitting-balance support: Feet supported Sitting balance-Leahy Scale: Good     Standing balance support: Bilateral upper extremity supported;During functional activity Standing balance-Leahy Scale: Fair Standing balance comment: requires assist and B UE support at this time                             Pertinent Vitals/Pain Pain Assessment: No/denies pain    Home Living Family/patient expects to be discharged to:: Private residence Living Arrangements: Spouse/significant other Available Help at Discharge: Family;Available 24 hours/day Type of Home: House Home Access: Stairs to enter   [employer] of Steps: 4-5 Home Layout: One level Home Equipment: None      Prior Function Level of Independence: Independent               Hand Dominance        Extremity/Trunk Assessment   Upper Extremity Assessment Upper Extremity Assessment: Overall WFL for tasks assessed    Lower Extremity Assessment Lower Extremity Assessment: Generalized weakness    Cervical / Trunk Assessment Cervical / Trunk Assessment:  Normal  Communication   Communication: No difficulties  Cognition Arousal/Alertness: Awake/alert Behavior During Therapy: WFL for tasks assessed/performed Overall Cognitive Status: History of cognitive impairments - at baseline                                        General Comments       Exercises     Assessment/Plan    PT Assessment Patient needs continued PT services  PT Problem List Decreased strength;Decreased mobility;Decreased activity tolerance;Decreased balance;Decreased cognition;Decreased safety awareness;Decreased knowledge of use of DME       PT Treatment Interventions DME instruction;Therapeutic exercise;Gait training;Balance training;Stair training;Functional mobility training;Therapeutic activities;Patient/family education    PT Goals (Current goals can be found in the Care Plan section)  Acute Rehab PT Goals Patient Stated Goal: wife would like for patient to return home PT Goal Formulation: With family Time For Goal Achievement: 07/20/21 Potential to Achieve Goals: Good    Frequency Min 3X/week   Barriers to discharge        Co-evaluation               AM-PAC PT "6 Clicks" Mobility  Outcome Measure Help needed turning from your back to your side while in a flat bed without using bedrails?: A Little Help needed moving from lying on your back to sitting on the side of a flat bed without using bedrails?: A Little Help needed moving to and from a bed to a chair (including a wheelchair)?: A Little Help needed standing up from a chair using your arms (e.g., wheelchair or bedside chair)?: A Little Help needed to walk in hospital room?: A Little Help needed climbing 3-5 steps with a railing? : A Little 6 Click Score: 18    End of Session Equipment Utilized During Treatment: Gait belt Activity Tolerance: Patient tolerated treatment well Patient left: in bed;with call bell/phone within reach;with bed alarm set;with family/visitor present Nurse Communication: Mobility status PT Visit Diagnosis: Unsteadiness on feet (R26.81);Other abnormalities of gait and mobility (R26.89);Muscle weakness (generalized) (M62.81);Difficulty in walking, not elsewhere classified (R26.2);History of falling (Z91.81)    Time: [id number] PT Time Calculation  (min) (ACUTE ONLY): 27 min   Charges:   PT Evaluation $PT Eval Moderate Complexity: 1 Mod PT Treatments $Gait Training: 8-22 mins        Gokul Waybright, PT, GCS 07/06/21,11:39 AM

## 2021-07-06 NOTE — ED Notes (Signed)
Pt's family notified, left VM.

## 2021-07-06 NOTE — Consult Note (Addendum)
Urology Consult Note   Requesting Attending Physician:  Aline August, MD Service Providing Consult: Urology  Consulting Attending: Louis Meckel, MD   Reason for Consult: Acute urinary retention, hydronephrosis, renal failure  HPI: Eric Krueger is seen in consultation for reasons noted above at the request of Aline August, MD  for evaluation of urinary retention, hydronephrosis, renal failure.  This is a 75 y.o. male with past medical history of Alzheimer's dementia, atrial fibrillation with pacemaker and defibrillator in place and on Eliquis.  He presents to the hospital after having falls at home and increasing weakness.  He was found to be in renal failure with a creatinine of 13.  Bilateral mild to moderate hydronephrosis.  Foley catheter was placed with over 750 cc of output immediately.  Foley output became bloody and Eliquis was held.  The Foley is now draining clear yellow.  His urine culture and blood culture data is negative.  He does remain on cefepime due to concern for skin infections.  His creatinine is normalizing down to 3.09 today.  He was having some postobstructive diuresis which seems to be improving.  Remains with some metabolic abnormalities including hypernatremia.  Given his dementia, history was obtained from his wife who accompanied him.  She states that he has been having urgency and urge incontinence for the past 2 months.  His primary care physician then started him on Myrbetriq.  Symptoms persisted and so he was also started on Flomax.  This was included to twice daily dosing.  During the 4 days prior to admission to the hospital, patient was no longer incontinent however he was not urinating much at all.  He does not have any known urologic history.  He does have an appointment arranged for September 1 in our clinic.   Past Medical History: Past Medical History:  Diagnosis Date   AICD (automatic cardioverter/defibrillator) present    Dual implantable  cardioverter-defibrillator in situ    Dyslipidemia    Dysrhythmia    Presence of permanent cardiac pacemaker    Pulmonary embolism (HCC)    Sigmoid diverticulosis    Tricuspid regurgitation    Ventricular tachycardia ([place])     Past Surgical History:  History reviewed. No pertinent surgical history.  Medication: Current Facility-Administered Medications  Medication Dose Route Frequency Provider Last Rate Last Admin   acetaminophen (TYLENOL) tablet 650 mg  650 mg Oral Q6H PRN Rise Patience, MD       Or   acetaminophen (TYLENOL) suppository 650 mg  650 mg Rectal Q6H PRN Rise Patience, MD       ceFEPIme (MAXIPIME) 2 g in sodium chloride 0.9 % 100 mL IVPB  2 g Intravenous Daily Pierce, Dwayne A, RPH 200 mL/hr at 07/06/21 0913 New Bag at 07/06/21 0913   dextrose 5 % solution   Intravenous Continuous Aline August, MD 75 mL/hr at 07/06/21 0912 New Bag at 07/06/21 0912   haloperidol lactate (HALDOL) injection 1-2 mg  1-2 mg Intravenous Q6H PRN Aline August, MD   1 mg at 07/05/21 2250   memantine (NAMENDA) tablet 10 mg  10 mg Oral BID Aline August, MD   10 mg at 07/06/21 0907   mirabegron ER (MYRBETRIQ) tablet 50 mg  50 mg Oral Daily Aline August, MD   50 mg at 07/06/21 0907   tamsulosin (FLOMAX) capsule 0.4 mg  0.4 mg Oral QHS Aline August, MD   0.4 mg at 07/05/21 2247    Allergies: Allergies  Allergen Reactions  Naproxen Sodium     Other reaction(s): Other (See Comments) Does not take per cardiologist   Other     Other reaction(s): Redness Other reaction(s): Other (See Comments) Other reaction(s): Redness   Adhesive [Tape]    Aspirin     Other reaction(s): Other (See Comments)   Ezetimibe-Simvastatin     myalgias Other reaction(s): Other (See Comments) myalgias   Latex     Other reaction(s): Other (See Comments)   Nsaids     Other reaction(s): Other (See Comments) Cannot take d/t defibrillator/pacemaker   Pedi-Pre Tape Spray [Wound Dressing Adhesive]      Other reaction(s): Other (See Comments)   Saw Palmetto Diarrhea    Social History: Social History   Tobacco Use   Smoking status: Never   Smokeless tobacco: Never  Substance Use Topics   Alcohol use: Not Currently   Drug use: No    Family History Family History  Problem Relation Age of Onset   Diabetes Mellitus II Other     Review of Systems 10 systems were reviewed and are negative except as noted specifically in the HPI.  Objective   Vital signs in last 24 hours: BP 131/81 (BP Location: Right Arm)   Pulse 68   Temp 97.9 F (36.6 C) (Oral)   Resp 17   Ht '6\' 2"'$  (1.88 m)   Wt 74.8 kg   SpO2 99%   BMI 21.18 kg/m   Physical Exam General: Resting, sleepy HEENT: [place]/AT, EOMI, MMM Pulmonary: Normal work of breathing Cardiovascular: HDS, adequate peripheral perfusion Abdomen: Soft, NTTP, nondistended. GU: Foley in place draining clear yellow urine output. Extremities: warm and well perfused Neuro: Appropriate, no focal neurological deficits  Most Recent Labs: Lab Results  Component Value Date   WBC 6.3 07/06/2021   HGB 11.1 (L) 07/06/2021   HCT 33.7 (L) 07/06/2021   PLT 175 07/06/2021    Lab Results  Component Value Date   NA 152 (H) 07/06/2021   K 3.5 07/06/2021   CL 119 (H) 07/06/2021   CO2 27 07/06/2021   BUN 65 (H) 07/06/2021   CREATININE 3.09 (H) 07/06/2021   CALCIUM 8.7 (L) 07/06/2021   MG 1.5 (L) 07/06/2021    Lab Results  Component Value Date   INR 3.7 (H) 07/04/2021   APTT 43 (H) 07/04/2021     Urine Culture: '@[id number]'$ (laburin,org,[id number],[id number])@   IMAGING: No results found.  ------  Assessment:  75 y.o. male with ast medical history of Alzheimer's dementia, atrial fibrillation with pacemaker and defibrillator in place and on Eliquis.  He presented with urinary retention, bilateral hydronephrosis, acute renal failure with creatinine of 13 including metabolic abnormalities.  750 cc out upon Foley placement.  Given concern for  bladder stretch injury, plan to keep Foley catheter in place until his appointment with [place] in clinic on 07/22/2021 at which point we will undergo a trial void and further urinary retention work-up.  Discussed possible etiologies of urinary retention with the wife at bedside with patient spouse including but not limited to BPH, dysfunctional bladder, neurologic bladder, prostatitis, prostate cancer.  Discussed that we would likely trial void him at the appointment and discuss further work-up of his urinary retention.  In the interim his Myrbetriq should be stopped.  If deemed safe from a medical standpoint he should continue on Flomax.  Will defer Eliquis management to the primary team.  If the urine becomes significantly bloody upon restarting Eliquis, would recommend obtaining clearance from his prescriber to hold until trial  of void at least.  Primary team is managing his electrolyte abnormalities and his postobstructive diuresis which seems to be improving.  Urine cultures are negative.  It would be reasonable to repeat a renal ultrasound prior to discharge to monitor for interval improvement of upper tract dilation with Foley catheter in place.  Would also request if possible for radiologist to try to size the prostate during the bladder ultrasound portion.   Recommendations: -Continue Foley catheter to drainage.  Outpatient appointment has been arranged for 07/22/2021 for trial of void and retention work-up -Stop Myrbetriq upon discharge.  Continue Flomax. -Recommend obtaining a repeat renal ultrasound prior to discharge to assess for interval improvement in upper urinary tract dilation, would also request for prostate sizing by the radiologist during the bladder ultrasound portion of the exam if possible.  This will aid [place] in our diagnostic work-up. -Agree with primary team monitoring of his fluid status, electrolytes in the setting of postobstructive diuresis which seems to be improving.  Thank you for  this consult. Please contact the urology consult pager with any further questions/concerns.

## 2021-07-06 NOTE — Plan of Care (Signed)
  Problem: Coping: Goal: Level of anxiety will decrease Outcome: Progressing   Problem: Elimination: Goal: Will not experience complications related to urinary retention Outcome: Progressing   Problem: Pain Managment: Goal: General experience of comfort will improve Outcome: Progressing   

## 2021-07-06 NOTE — Plan of Care (Signed)
  Problem: Elimination: Goal: Will not experience complications related to urinary retention Outcome: Progressing   Problem: Safety: Goal: Ability to remain free from injury will improve Outcome: Progressing   Problem: Skin Integrity: Goal: Risk for impaired skin integrity will decrease Outcome: Progressing   

## 2021-07-06 NOTE — Discharge Instructions (Addendum)

## 2021-07-07 ENCOUNTER — Inpatient Hospital Stay (HOSPITAL_COMMUNITY): Payer: Medicare Other

## 2021-07-07 DIAGNOSIS — I482 Chronic atrial fibrillation, unspecified: Secondary | ICD-10-CM

## 2021-07-07 LAB — CBC WITH DIFFERENTIAL/PLATELET
Abs Immature Granulocytes: 0.01 10*3/uL (ref 0.00–0.07)
Basophils Absolute: 0.1 10*3/uL (ref 0.0–0.1)
Basophils Relative: 1 %
Eosinophils Absolute: 0.4 10*3/uL (ref 0.0–0.5)
Eosinophils Relative: 7 %
HCT: 34.1 % — ABNORMAL LOW (ref 39.0–52.0)
Hemoglobin: 11.1 g/dL — ABNORMAL LOW (ref 13.0–17.0)
Immature Granulocytes: 0 %
Lymphocytes Relative: 10 %
Lymphs Abs: 0.6 10*3/uL — ABNORMAL LOW (ref 0.7–4.0)
MCH: 29.8 pg (ref 26.0–34.0)
MCHC: 32.6 g/dL (ref 30.0–36.0)
MCV: 91.7 fL (ref 80.0–100.0)
Monocytes Absolute: 0.4 10*3/uL (ref 0.1–1.0)
Monocytes Relative: 7 %
Neutro Abs: 4.7 10*3/uL (ref 1.7–7.7)
Neutrophils Relative %: 75 %
Platelets: 155 10*3/uL (ref 150–400)
RBC: 3.72 MIL/uL — ABNORMAL LOW (ref 4.22–5.81)
RDW: 17.2 % — ABNORMAL HIGH (ref 11.5–15.5)
WBC: 6.3 10*3/uL (ref 4.0–10.5)
nRBC: 0 % (ref 0.0–0.2)

## 2021-07-07 LAB — BASIC METABOLIC PANEL
Anion gap: 7 (ref 5–15)
BUN: 38 mg/dL — ABNORMAL HIGH (ref 8–23)
CO2: 21 mmol/L — ABNORMAL LOW (ref 22–32)
Calcium: 8.3 mg/dL — ABNORMAL LOW (ref 8.9–10.3)
Chloride: 118 mmol/L — ABNORMAL HIGH (ref 98–111)
Creatinine, Ser: 1.81 mg/dL — ABNORMAL HIGH (ref 0.61–1.24)
GFR, Estimated: 38 mL/min — ABNORMAL LOW (ref >60)
Glucose, Bld: 106 mg/dL — ABNORMAL HIGH (ref 70–99)
Potassium: 3.4 mmol/L — ABNORMAL LOW (ref 3.5–5.1)
Sodium: 146 mmol/L — ABNORMAL HIGH (ref 135–145)

## 2021-07-07 LAB — GLUCOSE, CAPILLARY
Glucose-Capillary: 104 mg/dL — ABNORMAL HIGH (ref 70–99)
Glucose-Capillary: 108 mg/dL — ABNORMAL HIGH (ref 70–99)
Glucose-Capillary: 115 mg/dL — ABNORMAL HIGH (ref 70–99)
Glucose-Capillary: 83 mg/dL (ref 70–99)
Glucose-Capillary: 92 mg/dL (ref 70–99)
Glucose-Capillary: 92 mg/dL (ref 70–99)

## 2021-07-07 LAB — MAGNESIUM: Magnesium: 1.3 mg/dL — ABNORMAL LOW (ref 1.7–2.4)

## 2021-07-07 MED ORDER — POTASSIUM CHLORIDE CRYS ER 20 MEQ PO TBCR
40.0000 meq | EXTENDED_RELEASE_TABLET | Freq: Once | ORAL | Status: AC
  Administered 2021-07-07: 40 meq via ORAL
  Filled 2021-07-07: qty 2

## 2021-07-07 MED ORDER — CEPHALEXIN 500 MG PO CAPS
500.0000 mg | ORAL_CAPSULE | Freq: Three times a day (TID) | ORAL | Status: DC
  Administered 2021-07-07 – 2021-07-08 (×5): 500 mg via ORAL
  Filled 2021-07-07 (×5): qty 1

## 2021-07-07 MED ORDER — APIXABAN 5 MG PO TABS
5.0000 mg | ORAL_TABLET | Freq: Two times a day (BID) | ORAL | Status: DC
  Administered 2021-07-07 (×2): 5 mg via ORAL
  Filled 2021-07-07 (×2): qty 1

## 2021-07-07 NOTE — Progress Notes (Signed)
ANTICOAGULATION CONSULT NOTE - Follow Up Consult  Pharmacy Consult for apixaban Indication: atrial fibrillation, h/o PE  Allergies  Allergen Reactions   Naproxen Sodium     Other reaction(s): Other (See Comments) Does not take per cardiologist   Other     Other reaction(s): Redness Other reaction(s): Other (See Comments) Other reaction(s): Redness   Adhesive [Tape]    Aspirin     Other reaction(s): Other (See Comments)   Ezetimibe-Simvastatin     myalgias Other reaction(s): Other (See Comments) myalgias   Latex     Other reaction(s): Other (See Comments)   Nsaids     Other reaction(s): Other (See Comments) Cannot take d/t defibrillator/pacemaker   Pedi-Pre Tape Spray [Wound Dressing Adhesive]     Other reaction(s): Other (See Comments)   Saw Palmetto Diarrhea    Patient Measurements: Height: '6\' 2"'$  (188 cm) Weight: 74.8 kg (165 lb) IBW/kg (Calculated) : 82.2   Vital Signs: Temp: 98 F (36.7 C) (08/17 0557) Temp Source: Oral (08/17 0557) BP: 149/57 (08/17 0557) Pulse Rate: 63 (08/17 0557)  Labs: Recent Labs    07/05/21 0500 07/06/21 0347 07/07/21 0217  HGB 11.2* 11.1* 11.1*  HCT 34.0* 33.7* 34.1*  PLT 193 175 155  CREATININE 5.95* 3.09* 1.81*    Estimated Creatinine Clearance: 36.2 mL/min (A) (by C-G formula based on SCr of 1.81 mg/dL (H)).   Medications:  Medications Prior to Admission  Medication Sig Dispense Refill Last Dose   acetaminophen (TYLENOL) 500 MG tablet Take 500 mg by mouth every 6 (six) hours as needed.   UNK   atorvastatin (LIPITOR) 10 MG tablet Take 10 mg by mouth daily.   07/03/2021   ELIQUIS 5 MG TABS tablet Take 5 mg by mouth 2 (two) times daily.  11 07/03/2021 at 0800   fenofibrate (TRICOR) 145 MG tablet Take 145 mg by mouth daily.   07/03/2021   memantine (NAMENDA) 10 MG tablet Take 10 mg by mouth 2 (two) times daily. 0.5 tab daily x 1 week; 1 tab x 1 week; 1.5 tab x 1 week; then 2 tab daily for memory   07/03/2021   mirtazapine  (REMERON) 15 MG tablet Take 15 mg by mouth at bedtime.   07/03/2021   MYRBETRIQ 50 MG TB24 tablet Take 50 mg by mouth daily.   07/03/2021   sotalol (BETAPACE) 80 MG tablet Take 80 mg by mouth 2 (two) times daily.  0 07/03/2021   tamsulosin (FLOMAX) 0.4 MG CAPS capsule Take 0.4 mg by mouth daily.   07/03/2021   cyanocobalamin 1000 MCG tablet Take by mouth. (Patient not taking: Reported on 07/04/2021)   Not Taking   donepezil (ARICEPT) 5 MG tablet Take 5 mg by mouth at bedtime. (Patient not taking: Reported on 07/04/2021)   Not Taking   gabapentin (NEURONTIN) 300 MG capsule TAKE 1 TO 2 CAPSULES EVERY EVENING BEFORE BEDTIME. (Patient not taking: Reported on 07/04/2021)   Not Taking   NEOMYCIN-POLYMYXIN-HYDROCORTISONE (CORTISPORIN) 1 % SOLN OTIC solution Apply 1-2 drops to toe BID after soaking (Patient not taking: Reported on 07/04/2021) 10 mL 1 Not Taking    Assessment: 75 yo male with h/o afib and PE on apixaban PTA. Last dose 8/13 PTA. Held for hematuria. MD now wishes to restart apixaban.   Goal of Therapy:  Prevention of stroke, VTE Monitor platelets by anticoagulation protocol: Yes   Plan:  Apixaban '5mg'$  PO BID Monitor for bleeding  Kemoni Quesenberry A. Levada Dy, PharmD, BCPS, FNKF Clinical Pharmacist [place] Please utilize  Amion for appropriate phone number to reach the unit pharmacist ([place])  07/07/2021,8:04 AM

## 2021-07-07 NOTE — Care Management Important Message (Signed)
Important Message  Patient Details  Name: Eric Krueger MRN: [id number] Date of Birth: 1946-05-18   Medicare Important Message Given:  Yes     Orbie Pyo 07/07/2021, 2:55 PM

## 2021-07-07 NOTE — Progress Notes (Signed)
PROGRESS NOTE  Eric Krueger [id number] DOB: Mar 23, 1946 DOA: 07/04/2021 PCP: Joneen Boers, MD   LOS: 3 days   Brief Narrative / Interim history: 75 y.o. 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 tach status post defibrillator placement presented with weakness and poor oral intake.  On presentation, patient was hypothermic with potassium of 6.2 and creatinine of 12.6.  Nephrology, Urology and PCCM were consulted.  He was started on bicarb drip.  Foley catheter was placed.  Subjective / 24h Interval events: No complaints today, feeling overall very well.  Assessment & Plan: Principal Problem AKI with hyperkalemia Acute metabolic acidosis -Probably obstructive in nature.  Renal ultrasound showed bilateral mild to moderate hydronephrosis without obstructing lesion.  Foley catheter was placed. -Presented with creatinine of 12.6 and potassium of 6.2.  Treated with Lokelma/D50 insulin/sodium bicarb push and calcium gluconate. -Nephrology signed off on 07/05/21.    -Creatinine significantly improving to 1.8 this morning with excellent urine output.  Acidosis is improved significantly.  Urology consulted recommending leaving Foley in upon discharge.  Active Problems Hypothermia Possible right lower extremity cellulitis -Possibly from above.  Cultures negative so far.  Patient is currently on empiric antibiotics.  Right lower extremity redness has much improved.  Narrowed to Keflex   Hematuria -Possibly from trauma from Foley catheter insertion.  Resolved, restart Eliquis today   Hypernatremia -Improving, continue D5W   Paroxysmal A. Fib History of pulmonary embolism -Currently intermittently bradycardic.  Resumed Eliquis today   History of V. tach/sinus node dysfunction with complete heart block status post pacemaker/defibrillator placement -Outpatient follow-up with  cardiology  Elevated LFTs -Questionable cause.  Improving.  Monitor   History of dementia -Continue Namenda.  Fall precautions.  PT eval. -Palliative care evaluation appreciated.  Patient remains full code.  Scheduled Meds:  apixaban  5 mg Oral BID   cephALEXin  500 mg Oral Q8H   Chlorhexidine Gluconate Cloth  6 each Topical Daily   memantine  10 mg Oral BID   tamsulosin  0.4 mg Oral QHS   Continuous Infusions:  dextrose 75 mL/hr at 07/07/21 0848   PRN Meds:.acetaminophen **OR** acetaminophen, haloperidol lactate  Diet Orders (From admission, onward)     Start     Ordered   07/06/21 0931  Diet Heart Room service appropriate? Yes; Fluid consistency: Thin  Diet effective now       Question Answer Comment  Room service appropriate? Yes   Fluid consistency: Thin      07/06/21 0930            DVT prophylaxis: Place and maintain sequential compression device Start: 07/06/21 0932 apixaban (ELIQUIS) tablet 5 mg     Code Status: Full Code  Family Communication: No family at bedside  Status is: Inpatient  Remains inpatient appropriate because:Inpatient level of care appropriate due to severity of illness  Dispo: The patient is from: Home              Anticipated d/c is to: Home              Patient currently is not medically stable to d/c.   Difficult to place patient No  Level of care: Progressive  Consultants:  Urology Nephrology PCCM  Procedures:  none  Microbiology  none  Antimicrobials: none    Objective: Vitals:   07/07/21 0000 07/07/21 0557 07/07/21 0748 07/07/21 0804  BP: (!) 146/79 (!) 149/57  (!) 150/63  Pulse: 66 63    Resp: 16 14    Temp:  98 F (36.7 C)  (!) 97.5 F (36.4 C)  TempSrc:  Oral  Oral  SpO2: 100% 99% 94%   Weight:      Height:        Intake/Output Summary (Last 24 hours) at 07/07/2021 1110 Last data filed at 07/07/2021 0651 Gross per 24 hour  Intake 1432.11 ml  Output 2600 ml  Net -1167.89 ml   Filed Weights    07/04/21 0430  Weight: 74.8 kg    Examination:  Constitutional: NAD Eyes: no scleral icterus ENMT: Mucous membranes are moist.  Neck: normal, supple Respiratory: clear to auscultation bilaterally, no wheezing, no crackles. Normal respiratory effort. No accessory muscle use.  Cardiovascular: Regular rate and rhythm, no murmurs / rubs / gallops. No LE edema. Good peripheral pulses Abdomen: non distended, no tenderness. Bowel sounds positive.  Musculoskeletal: no clubbing / cyanosis.  Skin: no rashes Neurologic: CN 2-12 grossly intact. Strength 5/5 in all 4.   Data Reviewed: I have independently reviewed following labs and imaging studies   CBC: Recent Labs  Lab 07/04/21 0205 07/04/21 0557 07/05/21 0500 07/06/21 0347 07/07/21 0217  WBC 6.4  --  6.7 6.3 6.3  NEUTROABS 5.4  --  5.0 4.4 4.7  HGB 12.4* 10.2* 11.2* 11.1* 11.1*  HCT 37.9* 30.0* 34.0* 33.7* 34.1*  MCV 90.9  --  88.8 89.6 91.7  PLT 191  --  193 175 [id number]   Basic Metabolic Panel: Recent Labs  Lab 07/04/21 1412 07/04/21 1716 07/05/21 0500 07/06/21 0347 07/07/21 0217  NA 144 147* 148* 152* 146*  K 5.0 5.0 3.7 3.5 3.4*  CL 116* 117* 119* 119* 118*  CO2 16* 18* 20* 27 21*  GLUCOSE 133* 108* 126* 110* 106*  BUN 178* 176* 126* 65* 38*  CREATININE 9.51* 8.97* 5.95* 3.09* 1.81*  CALCIUM 8.4* 9.0 8.6* 8.7* 8.3*  MG  --   --  1.9 1.5* 1.3*   Liver Function Tests: Recent Labs  Lab 07/04/21 0205 07/05/21 0500 07/06/21 0347  AST 77* 95* 91*  ALT 80* 78* 75*  ALKPHOS 35* 27* 29*  BILITOT 1.3* 0.9 0.6  PROT 6.2* 5.0* 5.2*  ALBUMIN 3.0* 2.3* 2.2*   Coagulation Profile: Recent Labs  Lab 07/04/21 0205  INR 3.7*   HbA1C: No results for input(s): HGBA1C in the last 72 hours. CBG: Recent Labs  Lab 07/06/21 1559 07/06/21 1934 07/06/21 2349 07/07/21 0423 07/07/21 0759  GLUCAP 97 125* 98 108* 104*    Recent Results (from the past 240 hour(s))  Blood Culture (routine x 2)     Status: None (Preliminary  result)   Collection Time: 07/04/21  1:55 AM   Specimen: BLOOD  Result Value Ref Range Status   Specimen Description   Final    BLOOD LEFT ANTECUBITAL Performed at [facility] [address], [place], [place] [address]    Special Requests   Final    BOTTLES DRAWN AEROBIC AND ANAEROBIC Blood Culture adequate volume Performed at [facility] [address] [place], [place] [address]    Culture   Final    NO GROWTH 2 DAYS Performed at [facility] [address] [place], [place] [address]    Report Status PENDING  Incomplete  Resp Panel by RT-PCR (Flu A&B, Covid) Nasopharyngeal Swab     Status: None   Collection Time: 07/04/21  1:57 AM   Specimen: Nasopharyngeal  Swab; Nasopharyngeal(NP) swabs in vial transport medium  Result Value Ref Range Status   SARS Coronavirus 2 by RT PCR NEGATIVE NEGATIVE Final    Comment: (NOTE) SARS-CoV-2 target nucleic acids are NOT DETECTED.  The SARS-CoV-2 RNA is generally detectable in upper respiratory specimens during the acute phase of infection. The lowest concentration of SARS-CoV-2 viral copies this assay can detect is 138 copies/mL. A negative result does not preclude SARS-Cov-2 infection and should not be used as the sole basis for treatment or other patient management decisions. A negative result may occur with  improper specimen collection/handling, submission of specimen other than nasopharyngeal swab, presence of viral mutation(s) within the areas targeted by this assay, and inadequate number of viral copies(<138 copies/mL). A negative result must be combined with clinical observations, patient history, and epidemiological information. The expected result is Negative.  Fact Sheet for Patients:  EntrepreneurPulse.com.au  Fact Sheet for Healthcare Providers:  IncredibleEmployment.be  This test is no t yet approved or cleared by the [place] FDA and   has been authorized for detection and/or diagnosis of SARS-CoV-2 by FDA under an Emergency Use Authorization (EUA). This EUA will remain  in effect (meaning this test can be used) for the duration of the COVID-19 declaration under Section 564(b)(1) of the Act, 21 U.S.C.section 360bbb-3(b)(1), unless the authorization is terminated  or revoked sooner.       Influenza A by PCR NEGATIVE NEGATIVE Final   Influenza B by PCR NEGATIVE NEGATIVE Final    Comment: (NOTE) The Xpert Xpress SARS-CoV-2/FLU/RSV plus assay is intended as an aid in the diagnosis of influenza from Nasopharyngeal swab specimens and should not be used as a sole basis for treatment. Nasal washings and aspirates are unacceptable for Xpert Xpress SARS-CoV-2/FLU/RSV testing.  Fact Sheet for Patients: EntrepreneurPulse.com.au  Fact Sheet for Healthcare Providers: IncredibleEmployment.be  This test is not yet approved or cleared by the [place] FDA and has been authorized for detection and/or diagnosis of SARS-CoV-2 by FDA under an Emergency Use Authorization (EUA). This EUA will remain in effect (meaning this test can be used) for the duration of the COVID-19 declaration under Section 564(b)(1) of the Act, 21 U.S.C. section 360bbb-3(b)(1), unless the authorization is terminated or revoked.  Performed at [facility], [place] [address], [place], [place] [address]   Blood Culture (routine x 2)     Status: None (Preliminary result)   Collection Time: 07/04/21  2:03 AM   Specimen: BLOOD  Result Value Ref Range Status   Specimen Description   Final    BLOOD BLOOD RIGHT FOREARM Performed at [facility] [address], [place], [place] [address]    Special Requests   Final    BOTTLES DRAWN AEROBIC AND ANAEROBIC Blood Culture adequate volume Performed at [facility] [address], [place], [place] [address]    Culture    Final    NO GROWTH 2 DAYS Performed at [facility] [address] [place], [place] [address]    Report Status PENDING  Incomplete  Urine Culture     Status: None   Collection Time: 07/04/21  6:46 AM   Specimen: In/Out Cath Urine  Result Value Ref Range Status   Specimen Description   Final    IN/OUT CATH URINE Performed at [facility] [address] [place], [place] [address]    Special Requests   Final    NONE Performed at [facility], [address] [place], [place] [address]    Culture   Final    NO GROWTH Performed at [facility], [place] [address], [place], [place] [address]    Report Status 07/05/2021 FINAL  Final     Radiology Studies: No results found.  Marzetta Board, MD, PhD [facility]  Between 7 am - 7 pm I am available, please contact me via Amion (for emergencies) or Securechat (non urgent messages)  Between 7 pm - 7 am I am not available, please contact night coverage MD/APP via Amion

## 2021-07-07 NOTE — Plan of Care (Signed)

## 2021-07-08 LAB — BASIC METABOLIC PANEL
Anion gap: 6 (ref 5–15)
BUN: 27 mg/dL — ABNORMAL HIGH (ref 8–23)
CO2: 22 mmol/L (ref 22–32)
Calcium: 8.5 mg/dL — ABNORMAL LOW (ref 8.9–10.3)
Chloride: 113 mmol/L — ABNORMAL HIGH (ref 98–111)
Creatinine, Ser: 1.47 mg/dL — ABNORMAL HIGH (ref 0.61–1.24)
GFR, Estimated: 49 mL/min — ABNORMAL LOW (ref >60)
Glucose, Bld: 90 mg/dL (ref 70–99)
Potassium: 3.6 mmol/L (ref 3.5–5.1)
Sodium: 141 mmol/L (ref 135–145)

## 2021-07-08 LAB — URINALYSIS, MICROSCOPIC (REFLEX): RBC / HPF: >50 RBC/hpf (ref 0–5)

## 2021-07-08 LAB — GLUCOSE, CAPILLARY
Glucose-Capillary: 81 mg/dL (ref 70–99)
Glucose-Capillary: 77 mg/dL (ref 70–99)
Glucose-Capillary: 98 mg/dL (ref 70–99)

## 2021-07-08 LAB — CBC
HCT: 37.2 % — ABNORMAL LOW (ref 39.0–52.0)
Hemoglobin: 12 g/dL — ABNORMAL LOW (ref 13.0–17.0)
MCH: 29.1 pg (ref 26.0–34.0)
MCHC: 32.3 g/dL (ref 30.0–36.0)
MCV: 90.1 fL (ref 80.0–100.0)
Platelets: 162 10*3/uL (ref 150–400)
RBC: 4.13 MIL/uL — ABNORMAL LOW (ref 4.22–5.81)
RDW: 16.6 % — ABNORMAL HIGH (ref 11.5–15.5)
WBC: 6.8 10*3/uL (ref 4.0–10.5)
nRBC: 0 % (ref 0.0–0.2)

## 2021-07-08 LAB — URINALYSIS, ROUTINE W REFLEX MICROSCOPIC
Bilirubin Urine: NEGATIVE
Glucose, UA: NEGATIVE mg/dL
Ketones, ur: NEGATIVE mg/dL
Nitrite: NEGATIVE
Protein, ur: 30 mg/dL — AB
Specific Gravity, Urine: 1.015 (ref 1.005–1.030)
pH: 6.5 (ref 5.0–8.0)

## 2021-07-08 MED ORDER — CEPHALEXIN 500 MG PO CAPS: 500.0000 mg | ORAL_CAPSULE | Freq: Three times a day (TID) | ORAL | 0 refills | Status: AC

## 2021-07-08 NOTE — TOC Transition Note (Signed)
Transition of Care [facility]) - CM/SW Discharge Note   Patient Details  Name: [name] MRN: [id number] Date of Birth: Oct 25, 1946  [facility]) CM/SW Contact:  Joanne Chars, LCSW Phone Number: 07/08/2021, 12:46 PM   Clinical Narrative:   Pt discharging with [facility].  Wife chose [facility] after reviewing options.  3n1 and walker through [employer].  Wife will transport home.  No other needs identified.        Barriers to Discharge: Continued Medical Work up   Patient Goals and CMS Choice Patient states their goals for this hospitalization and ongoing recovery are:: be less tired, per wife CMS Medicare.gov Compare Post Acute Care list provided to:: Patient Represenative (must comment) Choice offered to / list presented to : Spouse  Discharge Placement                       Discharge Plan and Services In-house Referral: Clinical Social Work   Post Acute Care Choice: Home Health          DME Arranged: 3-N-1, Walker rolling DME Agency: [employer] Date DME Agency Contacted: 07/08/21 Time DME Agency Contacted: [phone] Representative spoke with at DME Agency: Freda Munro [place]: PT [place]: [place] ([place]) Date [place]: 07/08/21 Time [place]: 1210 Representative spoke with at [place]: [place] ([place]) Interventions     Readmission Risk Interventions No flowsheet data found.

## 2021-07-08 NOTE — Plan of Care (Signed)

## 2021-07-08 NOTE — Progress Notes (Signed)
Physical Therapy Treatment Patient Details Name: Eric Krueger MRN: [id number] DOB: November 27, 1945 Today's Date: 07/08/2021    History of Present Illness Eric Krueger is a 75 y.o. 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 tach status post defibrillator placement was brought to the ER after patient was progressively getting weaker poor oral intake increased confusion over the last 2 weeks.  Patient wife was providing the history states that patient has been having some urinary incontinence and difficulty urinating for which patient was placed on tamsulosin by patient's urologist recently.  His urinary symptoms improved but patient has been having poor oral intake and progressively getting weaker.  So patient's wife decided to bring him to the ER.    PT Comments    Pt admitted with above diagnosis. Pt was able to ambulate with RW with min guard assist and cues. Spoke with wife regarding her comfort to assist pt and she was educated in assisting pt and issued gait belt to her. She is hiring caregivers for the both of them as well.  Discussed f/u and equipment as well and recs as below.   Pt currently with functional limitations due to balance and endurance deficits. Pt will benefit from skilled PT to increase their independence and safety with mobility to allow discharge to the venue listed below.      Follow Up Recommendations  Home health PT ([facility])     Equipment Recommendations  Rolling walker with 5" wheels;3in1 (PT)    Recommendations for Other Services       Precautions / Restrictions Precautions Precautions: Fall Restrictions Weight Bearing Restrictions: No    Mobility  Bed Mobility Overal bed mobility: Needs Assistance Bed Mobility: Supine to Sit;Sit to Supine     Supine to sit: Min assist     General bed mobility comments: Assist needed as pt had  difficulty scooting to edge of bed wihtout some assist.  Took incr time. Had to raise bed to make sit to stand easier but pt does have a tall bed at home.    Transfers Overall transfer level: Needs assistance Equipment used: Rolling walker (2 wheeled) Transfers: Sit to/from Stand Sit to Stand: Min guard         General transfer comment: Cues for pushing up from bed  Ambulation/Gait Ambulation/Gait assistance: Min guard Gait Distance (Feet): 200 Feet Assistive device: Rolling walker (2 wheeled) Gait Pattern/deviations: Step-through pattern;Decreased step length - right;Decreased step length - left;Narrow base of support;Decreased stride length Gait velocity: decr Gait velocity interpretation: <1.31 ft/sec, indicative of household ambulator General Gait Details: generally safe with mobility, slightly unsteady with turning   Stairs             Wheelchair Mobility    Modified Rankin (Stroke Patients Only)       Balance Overall balance assessment: Needs assistance Sitting-balance support: Feet supported Sitting balance-Leahy Scale: Good     Standing balance support: Bilateral upper extremity supported;During functional activity Standing balance-Leahy Scale: Poor Standing balance comment: requires assist and B UE support at this time                            Cognition Arousal/Alertness: Awake/alert Behavior During Therapy: WFL for tasks assessed/performed Overall Cognitive Status: History of cognitive impairments - at baseline  General Comments: wife tearful talking about pts decline      Exercises      General Comments General comments (skin integrity, edema, etc.): VSS      Pertinent Vitals/Pain Faces Pain Scale: No hurt    Home Living                      Prior Function            PT Goals (current goals can now be found in the care plan section) Acute Rehab PT Goals Patient  Stated Goal: wife would like for patient to return home Progress towards PT goals: Progressing toward goals    Frequency    Min 3X/week      PT Plan Current plan remains appropriate    Co-evaluation              AM-PAC PT "6 Clicks" Mobility   Outcome Measure  Help needed turning from your back to your side while in a flat bed without using bedrails?: A Little Help needed moving from lying on your back to sitting on the side of a flat bed without using bedrails?: A Little Help needed moving to and from a bed to a chair (including a wheelchair)?: A Little Help needed standing up from a chair using your arms (e.g., wheelchair or bedside chair)?: A Little Help needed to walk in hospital room?: A Little Help needed climbing 3-5 steps with a railing? : A Little 6 Click Score: 18    End of Session Equipment Utilized During Treatment: Gait belt Activity Tolerance: Patient tolerated treatment well Patient left: with call bell/phone within reach;with family/visitor present;in chair Nurse Communication: Mobility status PT Visit Diagnosis: Unsteadiness on feet (R26.81);Other abnormalities of gait and mobility (R26.89);Muscle weakness (generalized) (M62.81);Difficulty in walking, not elsewhere classified (R26.2);History of falling (Z91.81)     Time: [id number] PT Time Calculation (min) (ACUTE ONLY): 34 min  Charges:  $Gait Training: 23-37 mins                     Sui Kasparek M,PT Acute Rehab Services [phone] (pager)    Alvira Philips 07/08/2021, 12:50 PM

## 2021-07-08 NOTE — Progress Notes (Signed)
Discharge instruction given to the patient and his wife and stated understanding. Questions and concerns answered. Patient discharged via wheel chair with foley catheter in place, drainage bag changed to leg bag. Patient hemodynamically stable during discharge.

## 2021-07-08 NOTE — Discharge Summary (Signed)
Physician Discharge Summary  Eric Krueger [id number] DOB: 1946/01/05 DOA: 07/04/2021  PCP: Joneen Boers, MD  Admit date: 07/04/2021 Discharge date: 07/08/2021  Admitted From: home Disposition:  home  Recommendations for Outpatient Follow-up:  Follow up with urology as scheduled on 9/1  Home Health: PT Equipment/Devices: none   Discharge Condition: stable CODE STATUS: Full code Diet recommendation: regular  HPI: Per admitting MD, Eric Krueger is a 75 y.o. 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 tach status post defibrillator placement was brought to the ER after patient was progressively getting weaker poor oral intake increased confusion over the last 2 weeks.  Patient wife was providing the history states that patient has been having some urinary incontinence and difficulty urinating for which patient was placed on tamsulosin by patient's urologist recently.  His urinary symptoms improved but patient has been having poor oral intake and progressively getting weaker.  So patient's wife decided to bring him to the ER.  Denies any nausea vomiting diarrhea chest pain or shortness of breath.  Patient has dementia.  Hospital Course / Discharge diagnoses: Principal Problem AKI with hyperkalemia Acute metabolic acidosis -Probably obstructive in nature.  Renal ultrasound showed bilateral mild to moderate hydronephrosis without obstructing lesion.  Foley catheter was placed. Presented with creatinine of 12.6 and potassium of 6.2.  Treated with Lokelma/D50 insulin/sodium bicarb push and calcium gluconate. Nephrology signed off on 07/05/21.  Creatinine significantly improving to 1.4, excellent urine output.  Acidosis improved significantly.  He will go home with Foley catheter with urology outpatient follow-up on 9/1   Active Problems Hypothermia Possible right lower  extremity cellulitis -Possibly from above.  Cultures negative so far.  Patient is currently on empiric antibiotics.  Right lower extremity redness has much improved.  Narrowed to Keflex and completed few additional days postdischarge   Hematuria -Possibly from trauma from Foley catheter insertion.  Improved, still minimal after Eliquis, discussed with urology, he does not have blood clots or frank hematuria, hemoglobin is stable and pink-tinged urine will be tolerated from urologic standpoint Hypernatremia -resolved  Paroxysmal A. Fib  History of pulmonary embolism - Continue Eliquis History of V. tach/sinus node dysfunction with complete heart block status post pacemaker/defibrillator placement -Outpatient follow-up with cardiology Elevated LFTs -Questionable cause.  Improving.  Monitor History of dementia -Continue Namenda.  Fall precautions.  PT eval. -Palliative care evaluation appreciated.  Patient remains full code.  Sepsis ruled out   Discharge Instructions   Allergies as of 07/08/2021       Reactions   Naproxen Sodium    Other reaction(s): Other (See Comments) Does not take per cardiologist   Other    Other reaction(s): Redness Other reaction(s): Other (See Comments) Other reaction(s): Redness   Adhesive [tape]    Aspirin    Other reaction(s): Other (See Comments)   Ezetimibe-simvastatin    myalgias Other reaction(s): Other (See Comments) myalgias   Latex    Other reaction(s): Other (See Comments)   Nsaids    Other reaction(s): Other (See Comments) Cannot take d/t defibrillator/pacemaker   Pedi-pre Tape Spray [wound Dressing Adhesive]    Other reaction(s): Other (See Comments)   Saw Palmetto Diarrhea        Medication List     STOP taking these medications    cyanocobalamin 1000 MCG tablet   gabapentin 300 MG capsule Commonly known as: NEURONTIN   Myrbetriq 50 MG Tb24 tablet Generic  drug: mirabegron ER   NEOMYCIN-POLYMYXIN-HYDROCORTISONE 1 % Soln  OTIC solution Commonly known as: CORTISPORIN       TAKE these medications    acetaminophen 500 MG tablet Commonly known as: TYLENOL Take 500 mg by mouth every 6 (six) hours as needed.   atorvastatin 10 MG tablet Commonly known as: LIPITOR Take 10 mg by mouth daily.   cephALEXin 500 MG capsule Commonly known as: KEFLEX Take 1 capsule (500 mg total) by mouth every 8 (eight) hours for 3 days.   donepezil 5 MG tablet Commonly known as: ARICEPT Take 5 mg by mouth at bedtime.   Eliquis 5 MG Tabs tablet Generic drug: apixaban Take 5 mg by mouth 2 (two) times daily.   fenofibrate 145 MG tablet Commonly known as: TRICOR Take 145 mg by mouth daily.   memantine 10 MG tablet Commonly known as: NAMENDA Take 10 mg by mouth 2 (two) times daily. 0.5 tab daily x 1 week; 1 tab x 1 week; 1.5 tab x 1 week; then 2 tab daily for memory   mirtazapine 15 MG tablet Commonly known as: REMERON Take 15 mg by mouth at bedtime.   sotalol 80 MG tablet Commonly known as: BETAPACE Take 80 mg by mouth 2 (two) times daily.   tamsulosin 0.4 MG Caps capsule Commonly known as: FLOMAX Take 0.4 mg by mouth daily.        Follow-up Information     Hollace Hayward, NP .   Contact information: [place]. [place] [address] [phone]                 Consultations: Urology   Procedures/Studies:  US RENAL  Result Date: 07-14-21 CLINICAL DATA:  Renal failure EXAM: RENAL / URINARY TRACT ULTRASOUND COMPLETE COMPARISON:  07/04/2021 FINDINGS: Right Kidney: Renal measurements: 12.4 x 5.4 x 4.2 cm = volume: 146.3 mL. Echogenicity within normal limits. No mass or hydronephrosis visualized. Left Kidney: Renal measurements: 12.3 x 6.1 x 5 cm = volume: 194 mL. Echogenicity within normal limits. No solid mass or hydronephrosis visualized. 2.2 cm anechoic left renal mass consistent with a cyst. Bladder: Foley catheter in the bladder. Other: None. IMPRESSION: 1. No residual  obstructive uropathy. Electronically Signed   By: Kathreen Devoid M.D.   On: 07-14-2021 16:11   US RENAL  Result Date: 07/04/2021 CLINICAL DATA:  Acute renal failure. EXAM: RENAL / URINARY TRACT ULTRASOUND COMPLETE COMPARISON:  None. FINDINGS: Right Kidney: Renal measurements: 12.4 x 5.8 x 5.6 cm = volume: 210 mL. Moderate hydronephrosis. Small amount of perinephric fluid. No calculi identified. Left Kidney: Renal measurements: 11.1 x 5.9 x 4.7 cm = volume: 161 mL. Mild hydronephrosis. No calculi Bladder: Foley catheter within the bladder. Other: None. IMPRESSION: 1. Bilateral mild-to-moderate hydronephrosis without obstructing lesion identified. 2. Bladder collapsed about Foley catheter. Electronically Signed   By: Suzy Bouchard M.D.   On: 07/04/2021 07:44   DG Chest Port 1 View  Result Date: 07/04/2021 CLINICAL DATA:  Weakness, confusion, failure to thrive EXAM: PORTABLE CHEST 1 VIEW COMPARISON:  None. FINDINGS: Lungs are clear.  No pleural effusion or pneumothorax. Cardiomegaly.  Left subclavian ICD. IMPRESSION: Cardiomegaly. No evidence of acute cardiopulmonary disease. Electronically Signed   By: Julian Hy M.D.   On: 07/04/2021 02:18     Subjective: - no chest pain, shortness of breath, no abdominal pain, nausea or vomiting.   Discharge Exam: BP 121/66 (BP Location: Right Arm)   Pulse 80   Temp 98.2  F (36.8 C) (Oral)   Resp 15   Ht '6\' 2"'$  (1.88 m)   Wt 74.8 kg   SpO2 100%   BMI 21.18 kg/m   General: Pt is alert, awake, not in acute distress Cardiovascular: RRR, S1/S2 +, no rubs, no gallops Respiratory: CTA bilaterally, no wheezing, no rhonchi Abdominal: Soft, NT, ND, bowel sounds + Extremities: no edema, no cyanosis   The results of significant diagnostics from this hospitalization (including imaging, microbiology, ancillary and laboratory) are listed below for reference.     Microbiology: Recent Results (from the past 240 hour(s))  Blood Culture (routine x 2)      Status: None (Preliminary result)   Collection Time: 07/04/21  1:55 AM   Specimen: BLOOD  Result Value Ref Range Status   Specimen Description   Final    BLOOD LEFT ANTECUBITAL Performed at [facility] [address], [place], [place] [address]    Special Requests   Final    BOTTLES DRAWN AEROBIC AND ANAEROBIC Blood Culture adequate volume Performed at [facility] [address], [place], [place] [address]    Culture   Final    NO GROWTH 4 DAYS Performed at [facility], [place] [address] [place], [place] [address]    Report Status PENDING  Incomplete  Resp Panel by RT-PCR (Flu A&B, Covid) Nasopharyngeal Swab     Status: None   Collection Time: 07/04/21  1:57 AM   Specimen: Nasopharyngeal Swab; Nasopharyngeal(NP) swabs in vial transport medium  Result Value Ref Range Status   SARS Coronavirus 2 by RT PCR NEGATIVE NEGATIVE Final    Comment: (NOTE) SARS-CoV-2 target nucleic acids are NOT DETECTED.  The SARS-CoV-2 RNA is generally detectable in upper respiratory specimens during the acute phase of infection. The lowest concentration of SARS-CoV-2 viral copies this assay can detect is 138 copies/mL. A negative result does not preclude SARS-Cov-2 infection and should not be used as the sole basis for treatment or other patient management decisions. A negative result may occur with  improper specimen collection/handling, submission of specimen other than nasopharyngeal swab, presence of viral mutation(s) within the areas targeted by this assay, and inadequate number of viral copies(<138 copies/mL). A negative result must be combined with clinical observations, patient history, and epidemiological information. The expected result is Negative.  Fact Sheet for Patients:  EntrepreneurPulse.com.au  Fact Sheet for Healthcare Providers:  IncredibleEmployment.be  This test is no t yet approved or cleared by  the [place] FDA and  has been authorized for detection and/or diagnosis of SARS-CoV-2 by FDA under an Emergency Use Authorization (EUA). This EUA will remain  in effect (meaning this test can be used) for the duration of the COVID-19 declaration under Section 564(b)(1) of the Act, 21 U.S.C.section 360bbb-3(b)(1), unless the authorization is terminated  or revoked sooner.       Influenza A by PCR NEGATIVE NEGATIVE Final   Influenza B by PCR NEGATIVE NEGATIVE Final    Comment: (NOTE) The Xpert Xpress SARS-CoV-2/FLU/RSV plus assay is intended as an aid in the diagnosis of influenza from Nasopharyngeal swab specimens and should not be used as a sole basis for treatment. Nasal washings and aspirates are unacceptable for Xpert Xpress SARS-CoV-2/FLU/RSV testing.  Fact Sheet for Patients: EntrepreneurPulse.com.au  Fact Sheet for Healthcare Providers: IncredibleEmployment.be  This test is not yet approved or cleared by the [place] FDA and has been authorized for detection and/or diagnosis of SARS-CoV-2 by FDA under an Emergency Use  Authorization (EUA). This EUA will remain in effect (meaning this test can be used) for the duration of the COVID-19 declaration under Section 564(b)(1) of the Act, 21 U.S.C. section 360bbb-3(b)(1), unless the authorization is terminated or revoked.  Performed at [facility], [place] [address], [place], [place] [address]   Blood Culture (routine x 2)     Status: None (Preliminary result)   Collection Time: 07/04/21  2:03 AM   Specimen: BLOOD  Result Value Ref Range Status   Specimen Description   Final    BLOOD BLOOD RIGHT FOREARM Performed at [facility] [address] [place], [place] [address]    Special Requests   Final    BOTTLES DRAWN AEROBIC AND ANAEROBIC Blood Culture adequate volume Performed at [facility] [address]  [place], [place] [address]    Culture   Final    NO GROWTH 4 DAYS Performed at [facility], [place] [address], [place], [place] [address]    Report Status PENDING  Incomplete  Urine Culture     Status: None   Collection Time: 07/04/21  6:46 AM   Specimen: In/Out Cath Urine  Result Value Ref Range Status   Specimen Description   Final    IN/OUT CATH URINE Performed at [facility] [address] [place], [place] [address]    Special Requests   Final    NONE Performed at [facility], [place] [address] [place], [place] [address]    Culture   Final    NO GROWTH Performed at [facility], [place] [address], [place], [place] [address]    Report Status 07/05/2021 FINAL  Final     Labs: Basic Metabolic Panel: Recent Labs  Lab 07/04/21 1716 07/05/21 0500 07/06/21 0347 07/07/21 0217 07/08/21 0107  NA 147* 148* 152* 146* 141  K 5.0 3.7 3.5 3.4* 3.6  CL 117* 119* 119* 118* 113*  CO2 18* 20* 27 21* 22  GLUCOSE 108* 126* 110* 106* 90  BUN 176* 126* 65* 38* 27*  CREATININE 8.97* 5.95* 3.09* 1.81* 1.47*  CALCIUM 9.0 8.6* 8.7* 8.3* 8.5*  MG  --  1.9 1.5* 1.3*  --    Liver Function Tests: Recent Labs  Lab 07/04/21 0205 07/05/21 0500 07/06/21 0347  AST 77* 95* 91*  ALT 80* 78* 75*  ALKPHOS 35* 27* 29*  BILITOT 1.3* 0.9 0.6  PROT 6.2* 5.0* 5.2*  ALBUMIN 3.0* 2.3* 2.2*   CBC: Recent Labs  Lab 07/04/21 0205 07/04/21 0557 07/05/21 0500 07/06/21 0347 07/07/21 0217 07/08/21 0107  WBC 6.4  --  6.7 6.3 6.3 6.8  NEUTROABS 5.4  --  5.0 4.4 4.7  --   HGB 12.4* 10.2* 11.2* 11.1* 11.1* 12.0*  HCT 37.9* 30.0* 34.0* 33.7* 34.1* 37.2*  MCV 90.9  --  88.8 89.6 91.7 90.1  PLT 191  --  193 175 155 162   CBG: Recent Labs  Lab 07/07/21 1932 07/07/21 2340 07/08/21 0357 07/08/21 0757 07/08/21 1143  GLUCAP 115* 83 81 77 98   Hgb A1c No results for input(s): HGBA1C in the last 72 hours. Lipid Profile No results for input(s): CHOL,  HDL, LDLCALC, TRIG, CHOLHDL, LDLDIRECT in the last 72 hours. Thyroid function studies No results for input(s): TSH, T4TOTAL, T3FREE, THYROIDAB in the last 72 hours.  Invalid input(s): FREET3 Urinalysis    Component Value Date/Time   COLORURINE RED (A) 07/08/2021 0819   APPEARANCEUR TURBID (  A) 07/08/2021 0819   LABSPEC 1.015 07/08/2021 0819   PHURINE 6.5 07/08/2021 0819   GLUCOSEU NEGATIVE 07/08/2021 0819   HGBUR LARGE (A) 07/08/2021 0819   BILIRUBINUR NEGATIVE 07/08/2021 0819   KETONESUR NEGATIVE 07/08/2021 0819   PROTEINUR 30 (A) 07/08/2021 0819   NITRITE NEGATIVE 07/08/2021 0819   LEUKOCYTESUR SMALL (A) 07/08/2021 0819    FURTHER DISCHARGE INSTRUCTIONS:   Get Medicines reviewed and adjusted: Please take all your medications with you for your next visit with your Primary MD   Laboratory/radiological data: Please request your Primary MD to go over all hospital tests and procedure/radiological results at the follow up, please ask your Primary MD to get all Hospital records sent to his/her office.   In some cases, they will be blood work, cultures and biopsy results pending at the time of your discharge. Please request that your primary care M.D. goes through all the records of your hospital data and follows up on these results.   Also Note the following: If you experience worsening of your admission symptoms, develop shortness of breath, life threatening emergency, suicidal or homicidal thoughts you must seek medical attention immediately by calling 911 or calling your MD immediately  if symptoms less severe.   You must read complete instructions/literature along with all the possible adverse reactions/side effects for all the Medicines you take and that have been prescribed to you. Take any new Medicines after you have completely understood and accpet all the possible adverse reactions/side effects.    Do not drive when taking Pain medications or sleeping medications  (Benzodaizepines)   Do not take more than prescribed Pain, Sleep and Anxiety Medications. It is not advisable to combine anxiety,sleep and pain medications without talking with your primary care practitioner   Special Instructions: If you have smoked or chewed Tobacco  in the last 2 yrs please stop smoking, stop any regular Alcohol  and or any Recreational drug use.   Wear Seat belts while driving.   Please note: You were cared for by a hospitalist during your hospital stay. Once you are discharged, your primary care physician will handle any further medical issues. Please note that NO REFILLS for any discharge medications will be authorized once you are discharged, as it is imperative that you return to your primary care physician (or establish a relationship with a primary care physician if you do not have one) for your post hospital discharge needs so that they can reassess your need for medications and monitor your lab values.  Time coordinating discharge: 40 minutes  SIGNED:  Marzetta Board, MD, PhD 07/08/2021, 12:05 PM

## 2021-07-08 NOTE — TOC Initial Note (Signed)
Transition of Care [facility]) - Initial/Assessment Note    Patient Details  Name: Eric Krueger MRN: [id number] Date of Birth: 15-Feb-1946  [facility]) CM/SW Contact:    Joanne Chars, LCSW Phone Number: 07/08/2021, 8:28 AM  Clinical Narrative:   CSW met with pt and wife Eric Krueger regarding DC recommendation for [facility].  Pt with dementia, unable to provide information, does give permission to speak with wife and the rest of the interactions were with her.  Cathy agreeable to [facility], choice document given and she would like to research the agencies before choosing.  She has arranged for [facility] aide with [facility] in [place] and this service will be starting at discharge.  No DME in the home currently.  PCP in place.  Pt is vaccinated for covid with one booster.                Expected Discharge Plan: [place] Barriers to Discharge: Continued Medical Work up   Patient Goals and CMS Choice Patient states their goals for this hospitalization and ongoing recovery are:: be less tired, per wife CMS Medicare.gov Compare Post Acute Care list provided to:: Patient Represenative (must comment) Choice offered to / list presented to : Spouse  Expected Discharge Plan and Services Expected Discharge Plan: [place] In-house Referral: Clinical Social Work   Post Acute Care Choice: [place] arrangements for the past 2 months: [place]                                      Prior Living Arrangements/Services Living arrangements for the past 2 months: Single Family Home Lives with:: Spouse Patient language and need for interpreter reviewed:: Yes        Need for Family Participation in Patient Care: Yes (Comment) Care giver support system in place?: Yes (comment) Current home services: Homehealth aide ([place] aide through [facility] will be starting soon) Criminal Activity/Legal Involvement Pertinent to Current Situation/Hospitalization: No -  Comment as needed  Activities of Daily Living      Permission Sought/Granted Permission sought to share information with : Family Supports Permission granted to share information with : Yes, Verbal Permission Granted  Share Information with NAME: wife Eric Krueger  Permission granted to share info w AGENCY: HH        Emotional Assessment Appearance:: Appears stated age Attitude/Demeanor/Rapport: Unable to Assess Affect (typically observed): Unable to Assess Orientation: : Oriented to Self Alcohol / Substance Use: Not Applicable Psych Involvement: No (comment)  Admission diagnosis:  ARF (acute renal failure) ([place]) [N17.9] Acute renal failure, unspecified acute renal failure type [facility]) [N17.9] Patient Active Problem List   Diagnosis Date Noted   ARF (acute renal failure) ([place]) 07/04/2021   Hypothermia 07/04/2021   DDD (degenerative disc disease), lumbar 01/01/2019   Sacroiliitis ([place]) 06/27/2018   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   History of gout 04/10/2018   Bilateral lower extremity edema 04/09/2018   Chronic pain disorder 12/22/2017   Need for hepatitis C screening test 06/29/2016   Benign non-nodular prostatic hyperplasia with lower urinary tract symptoms 06/15/2016   Abnormal glucose 11/27/2015   Dupuytren's contracture 09/12/2014   Atrial fibrillation, chronic ([place]) 06/03/2014   Mild cognitive impairment with memory loss 05/27/2014   Sigmoid diverticulosis 02/25/2014   History of pulmonary embolism 05/01/2013   Complete heart block ([place]) 05/17/2011   Dual implantable cardioverter-defibrillator  in situ 05/17/2011   Mitral regurgitation 04/13/2011   Sick sinus syndrome ([place]) 04/13/2011   Ventricular tachycardia ([place]) 04/13/2011   Dyslipidemia 08/10/2010   PCP:  Joneen Boers, MD Pharmacy:   [facility] [phone] - [place], [place] - [address] [place] [address] Phone: [phone] Fax:  [phone]     Social Determinants of Health (SDOH) Interventions    Readmission Risk Interventions No flowsheet data found.

## 2021-07-09 LAB — CULTURE, BLOOD (ROUTINE X 2)
Culture: NO GROWTH
Culture: NO GROWTH
Special Requests: ADEQUATE
Special Requests: ADEQUATE

## 2021-09-10 ENCOUNTER — Other Ambulatory Visit: Payer: Self-pay

## 2021-09-10 ENCOUNTER — Encounter (HOSPITAL_BASED_OUTPATIENT_CLINIC_OR_DEPARTMENT_OTHER): Payer: Self-pay | Admitting: Urology

## 2021-09-10 NOTE — Progress Notes (Addendum)
ADDENDUM:  Received via fax pt's last 12 lead EKG tracing done 09-19-2022j from pt's cardiologist, placed in chart.   ADDENDUM:  Chart reviewed w/ anesthesia, Dr Smith Robert MDA, informed pt is stable and no change in status since last cardiology visit for clearance 08-09-2021, stated as long as pt is stable ok to proceed at [facility].   Spoke w/ via phone for pre-op interview--- pt's wife, [name] (pt has dementia) Lab needs dos----  [employer] results------ current ekg 08-09-2021 (called and faxed request for 12 lead ekg tracing) COVID test -----patient wife states asymptomatic  but aware to go get covid test 10/ 26 since overnight   Arrive at ------- 0930 NPO after MN with exception sips of water w/ meds Med rec completed Medications to take morning of surgery ----- namenda, tricor, sotalol, flomax Diabetic medication ----- n/a Patient instructed no nail polish to be worn day of surgery Patient instructed to bring photo id and insurance card day of surgery  Patient aware to have Driver (ride ) / caregiver   for 24 hours after surgery --wife, catherine Patient Special Instructions ----- reviewed [facility] and visitor guidelines Pre-Op special Istructions ----- pt has cardiac office clearance note in care everywhere by Dr Rowland Lathe on 08-09-2021 , printed copy for chart Pt wife will need to be in pre-op w/ pt due to dementia.  Pt wife stated pt uses cane or walker when ambulates , he is able to communicate /dress / eat/ toileting/ move in bed himself but wife says for safety someone always is with him.  Device orders have been faxed to [facility] cardiology in [place] today.  Patient wife verbalized understanding of instructions that were given at this phone interview. Patient wife stated pt denies shortness of breath, chest pain, fever, cough at this phone interview.    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  Also, stated has never been shocked by ICD  PCP: Dr A. Boals (lov 05-06-2021 care everywhere) Cardiologist : Dr Rowland Lathe Cassell Clement 08-09-2021 epic) Neurologist:  Dr D. Bjorn Loser [facility] 08-11-2021 care everywhere) Chest x-ray : 07-04-2021 epic EKG : 07-04-2021 epic in setting of UTI/ hyperkalemia;  cardiology did one 08-09-2021, requested tracing Echo : 05-18-2020 care everywhere Stress test: no CCT:  03-25-2011 nonobstructive disease Cardiac Cath : 01-28-2011 care everywhere Activity level: denies sob w/ activity Sleep Study/ CPAP : no  Blood Thinner/ Instructions Maryjane Hurter Dose: Eliquis ASA / Instructions/ Last Dose :  no Per pt wife was given instructions from cardiology to stop 48 hours prior to surgery, stated last dose will be 09-14-2021 evening dose

## 2021-09-14 ENCOUNTER — Other Ambulatory Visit: Payer: Self-pay | Admitting: Urology

## 2021-09-16 ENCOUNTER — Other Ambulatory Visit: Payer: Self-pay | Admitting: Urology

## 2021-09-17 ENCOUNTER — Other Ambulatory Visit: Payer: Self-pay

## 2021-09-17 ENCOUNTER — Encounter (HOSPITAL_BASED_OUTPATIENT_CLINIC_OR_DEPARTMENT_OTHER)
Admission: RE | Disposition: A | Discharge status: Home / Self Care | Payer: Self-pay | Source: Home / Self Care | Attending: Urology

## 2021-09-17 ENCOUNTER — Ambulatory Visit (HOSPITAL_BASED_OUTPATIENT_CLINIC_OR_DEPARTMENT_OTHER): Payer: Medicare Other | Admitting: Anesthesiology

## 2021-09-17 ENCOUNTER — Encounter (HOSPITAL_BASED_OUTPATIENT_CLINIC_OR_DEPARTMENT_OTHER): Payer: Self-pay | Admitting: Urology

## 2021-09-17 ENCOUNTER — Ambulatory Visit (HOSPITAL_BASED_OUTPATIENT_CLINIC_OR_DEPARTMENT_OTHER)
Admission: RE | Admit: 2021-09-17 | Discharge: 2021-09-18 | Disposition: A | Discharge status: Home / Self Care | Payer: Medicare Other | Attending: Urology | Admitting: Urology

## 2021-09-17 DIAGNOSIS — Z888 Allergy status to other drugs, medicaments and biological substances status: Secondary | ICD-10-CM | POA: Diagnosis not present

## 2021-09-17 DIAGNOSIS — R338 Other retention of urine: Secondary | ICD-10-CM | POA: Insufficient documentation

## 2021-09-17 DIAGNOSIS — C61 Malignant neoplasm of prostate: Secondary | ICD-10-CM | POA: Diagnosis not present

## 2021-09-17 DIAGNOSIS — N400 Enlarged prostate without lower urinary tract symptoms: Secondary | ICD-10-CM | POA: Diagnosis present

## 2021-09-17 DIAGNOSIS — Z7901 Long term (current) use of anticoagulants: Secondary | ICD-10-CM | POA: Insufficient documentation

## 2021-09-17 DIAGNOSIS — F0280 Dementia in other diseases classified elsewhere without behavioral disturbance: Secondary | ICD-10-CM | POA: Diagnosis not present

## 2021-09-17 DIAGNOSIS — I4891 Unspecified atrial fibrillation: Secondary | ICD-10-CM | POA: Diagnosis not present

## 2021-09-17 DIAGNOSIS — Z833 Family history of diabetes mellitus: Secondary | ICD-10-CM | POA: Insufficient documentation

## 2021-09-17 DIAGNOSIS — Z86711 Personal history of pulmonary embolism: Secondary | ICD-10-CM | POA: Insufficient documentation

## 2021-09-17 DIAGNOSIS — G300 Alzheimer's disease with early onset: Secondary | ICD-10-CM | POA: Insufficient documentation

## 2021-09-17 DIAGNOSIS — N401 Enlarged prostate with lower urinary tract symptoms: Secondary | ICD-10-CM | POA: Insufficient documentation

## 2021-09-17 DIAGNOSIS — Z9109 Other allergy status, other than to drugs and biological substances: Secondary | ICD-10-CM | POA: Diagnosis not present

## 2021-09-17 DIAGNOSIS — N183 Chronic kidney disease, stage 3 unspecified: Secondary | ICD-10-CM | POA: Diagnosis not present

## 2021-09-17 DIAGNOSIS — Z886 Allergy status to analgesic agent status: Secondary | ICD-10-CM | POA: Insufficient documentation

## 2021-09-17 DIAGNOSIS — Z79899 Other long term (current) drug therapy: Secondary | ICD-10-CM | POA: Diagnosis not present

## 2021-09-17 DIAGNOSIS — Z95810 Presence of automatic (implantable) cardiac defibrillator: Secondary | ICD-10-CM | POA: Diagnosis not present

## 2021-09-17 DIAGNOSIS — C7911 Secondary malignant neoplasm of bladder: Secondary | ICD-10-CM | POA: Diagnosis not present

## 2021-09-17 DIAGNOSIS — N138 Other obstructive and reflux uropathy: Secondary | ICD-10-CM | POA: Diagnosis not present

## 2021-09-17 HISTORY — DX: Long term (current) use of anticoagulants: Z79.01

## 2021-09-17 HISTORY — DX: Presence of other specified devices: Z97.8

## 2021-09-17 HISTORY — DX: Chronic kidney disease, stage 3 unspecified: N18.30

## 2021-09-17 HISTORY — DX: Atrioventricular block, complete: I44.2

## 2021-09-17 HISTORY — DX: Other persistent atrial fibrillation: I48.19

## 2021-09-17 HISTORY — DX: Dementia in other diseases classified elsewhere, unspecified severity, without behavioral disturbance, psychotic disturbance, mood disturbance, and anxiety: F02.80

## 2021-09-17 HISTORY — DX: Rheumatic tricuspid insufficiency: I07.1

## 2021-09-17 HISTORY — DX: Benign prostatic hyperplasia with lower urinary tract symptoms: N13.8

## 2021-09-17 HISTORY — PX: TRANSURETHRAL RESECTION OF PROSTATE: SHX73

## 2021-09-17 HISTORY — DX: Personal history of other diseases of the musculoskeletal system and connective tissue: Z87.39

## 2021-09-17 LAB — POCT I-STAT, CHEM 8
BUN: 42 mg/dL — ABNORMAL HIGH (ref 8–23)
Calcium, Ion: 1.41 mmol/L — ABNORMAL HIGH (ref 1.15–1.40)
Chloride: 102 mmol/L (ref 98–111)
Creatinine, Ser: 1.5 mg/dL — ABNORMAL HIGH (ref 0.61–1.24)
Glucose, Bld: 89 mg/dL (ref 70–99)
HCT: 39 % (ref 39.0–52.0)
Hemoglobin: 13.3 g/dL (ref 13.0–17.0)
Potassium: 4.2 mmol/L (ref 3.5–5.1)
Sodium: 143 mmol/L (ref 135–145)
TCO2: 30 mmol/L (ref 22–32)

## 2021-09-17 LAB — SARS CORONAVIRUS 2 (TAT 6-24 HRS): SARS Coronavirus 2: NEGATIVE

## 2021-09-17 SURGERY — TURP (TRANSURETHRAL RESECTION OF PROSTATE)
Anesthesia: General | Site: Prostate

## 2021-09-17 MED ORDER — PHENYLEPHRINE 40 MCG/ML (10ML) SYRINGE FOR IV PUSH (FOR BLOOD PRESSURE SUPPORT)
PREFILLED_SYRINGE | INTRAVENOUS | Status: AC
  Filled 2021-09-17: qty 10

## 2021-09-17 MED ORDER — CEFAZOLIN SODIUM-DEXTROSE 2-4 GM/100ML-% IV SOLN
2.0000 g | Freq: Once | INTRAVENOUS | Status: AC
  Administered 2021-09-17: 2 g via INTRAVENOUS

## 2021-09-17 MED ORDER — OXYCODONE-ACETAMINOPHEN 5-325 MG PO TABS: 1.0000 | ORAL_TABLET | ORAL | Status: DC | PRN

## 2021-09-17 MED ORDER — SODIUM CHLORIDE 0.9 % IR SOLN: 3000.0000 mL | Status: DC

## 2021-09-17 MED ORDER — FENTANYL CITRATE (PF) 100 MCG/2ML IJ SOLN
INTRAMUSCULAR | Status: AC
  Filled 2021-09-17: qty 2

## 2021-09-17 MED ORDER — SODIUM CHLORIDE 0.9 % IR SOLN
Status: DC | PRN
  Administered 2021-09-17 (×7): 6000 mL

## 2021-09-17 MED ORDER — LIDOCAINE 2% (20 MG/ML) 5 ML SYRINGE
INTRAMUSCULAR | Status: AC
  Filled 2021-09-17: qty 5

## 2021-09-17 MED ORDER — ONDANSETRON HCL 4 MG/2ML IJ SOLN: 4.0000 mg | INTRAMUSCULAR | Status: DC | PRN

## 2021-09-17 MED ORDER — WHITE PETROLATUM EX OINT
TOPICAL_OINTMENT | CUTANEOUS | Status: AC
  Filled 2021-09-17: qty 5

## 2021-09-17 MED ORDER — DIPHENHYDRAMINE HCL 12.5 MG/5ML PO ELIX: 12.5000 mg | ORAL_SOLUTION | Freq: Four times a day (QID) | ORAL | Status: DC | PRN

## 2021-09-17 MED ORDER — PHENYLEPHRINE HCL (PRESSORS) 10 MG/ML IV SOLN
INTRAVENOUS | Status: AC
  Filled 2021-09-17: qty 1

## 2021-09-17 MED ORDER — POTASSIUM CHLORIDE IN NACL 20-0.45 MEQ/L-% IV SOLN
INTRAVENOUS | Status: DC
  Administered 2021-09-17: 1 mL via INTRAVENOUS
  Filled 2021-09-17 (×4): qty 1000

## 2021-09-17 MED ORDER — MEMANTINE HCL 10 MG PO TABS
10.0000 mg | ORAL_TABLET | Freq: Two times a day (BID) | ORAL | Status: DC
Start: 2021-09-17 — End: 2021-09-18
  Administered 2021-09-17: 10 mg via ORAL
  Filled 2021-09-17 (×2): qty 1

## 2021-09-17 MED ORDER — PHENYLEPHRINE HCL-NACL 20-0.9 MG/250ML-% IV SOLN
INTRAVENOUS | Status: DC | PRN
  Administered 2021-09-17: 20 ug/min via INTRAVENOUS

## 2021-09-17 MED ORDER — ACETAMINOPHEN 325 MG PO TABS
650.0000 mg | ORAL_TABLET | ORAL | Status: DC | PRN
  Administered 2021-09-18: 650 mg via ORAL

## 2021-09-17 MED ORDER — DIPHENHYDRAMINE HCL 50 MG/ML IJ SOLN: 12.5000 mg | Freq: Four times a day (QID) | INTRAMUSCULAR | Status: DC | PRN

## 2021-09-17 MED ORDER — FENTANYL CITRATE (PF) 100 MCG/2ML IJ SOLN: 25.0000 ug | INTRAMUSCULAR | Status: DC | PRN

## 2021-09-17 MED ORDER — OXYCODONE-ACETAMINOPHEN 5-325 MG PO TABS: 1.0000 | ORAL_TABLET | ORAL | 0 refills | Status: DC | PRN

## 2021-09-17 MED ORDER — SODIUM CHLORIDE 0.9 % IV SOLN: INTRAVENOUS | Status: DC

## 2021-09-17 MED ORDER — ACETAMINOPHEN 500 MG PO TABS
ORAL_TABLET | ORAL | Status: AC
  Filled 2021-09-17: qty 2

## 2021-09-17 MED ORDER — DOCUSATE SODIUM 100 MG PO CAPS
100.0000 mg | ORAL_CAPSULE | Freq: Two times a day (BID) | ORAL | Status: DC
  Administered 2021-09-17 (×2): 100 mg via ORAL

## 2021-09-17 MED ORDER — MIRTAZAPINE 15 MG PO TABS
15.0000 mg | ORAL_TABLET | Freq: Every day | ORAL | Status: DC
  Administered 2021-09-17: 15 mg via ORAL
  Filled 2021-09-17 (×2): qty 1

## 2021-09-17 MED ORDER — DOCUSATE SODIUM 100 MG PO CAPS: 100.0000 mg | ORAL_CAPSULE | Freq: Every day | ORAL | 0 refills | Status: DC | PRN

## 2021-09-17 MED ORDER — EPHEDRINE SULFATE-NACL 50-0.9 MG/10ML-% IV SOSY
PREFILLED_SYRINGE | INTRAVENOUS | Status: DC | PRN
  Administered 2021-09-17: 10 mg via INTRAVENOUS

## 2021-09-17 MED ORDER — HYDROMORPHONE HCL 1 MG/ML IJ SOLN: 0.5000 mg | INTRAMUSCULAR | Status: DC | PRN

## 2021-09-17 MED ORDER — PROPOFOL 10 MG/ML IV BOLUS
INTRAVENOUS | Status: DC | PRN
  Administered 2021-09-17: 100 mg via INTRAVENOUS
  Administered 2021-09-17: 40 mg via INTRAVENOUS
  Administered 2021-09-17: 50 mg via INTRAVENOUS

## 2021-09-17 MED ORDER — EPHEDRINE 5 MG/ML INJ
INTRAVENOUS | Status: AC
  Filled 2021-09-17: qty 5

## 2021-09-17 MED ORDER — DOCUSATE SODIUM 100 MG PO CAPS
ORAL_CAPSULE | ORAL | Status: AC
  Filled 2021-09-17: qty 1

## 2021-09-17 MED ORDER — LIDOCAINE 2% (20 MG/ML) 5 ML SYRINGE
INTRAMUSCULAR | Status: DC | PRN
  Administered 2021-09-17: 60 mg via INTRAVENOUS

## 2021-09-17 MED ORDER — SOTALOL HCL 80 MG PO TABS
80.0000 mg | ORAL_TABLET | Freq: Two times a day (BID) | ORAL | Status: DC
  Administered 2021-09-17: 80 mg via ORAL
  Filled 2021-09-17 (×2): qty 1

## 2021-09-17 MED ORDER — CEFAZOLIN SODIUM-DEXTROSE 2-4 GM/100ML-% IV SOLN
INTRAVENOUS | Status: AC
  Filled 2021-09-17: qty 100

## 2021-09-17 MED ORDER — ACETAMINOPHEN 500 MG PO TABS
1000.0000 mg | ORAL_TABLET | Freq: Once | ORAL | Status: AC
  Administered 2021-09-17: 1000 mg via ORAL

## 2021-09-17 MED ORDER — PROPOFOL 10 MG/ML IV BOLUS
INTRAVENOUS | Status: AC
  Filled 2021-09-17: qty 20

## 2021-09-17 MED ORDER — AMISULPRIDE (ANTIEMETIC) 5 MG/2ML IV SOLN: 10.0000 mg | Freq: Once | INTRAVENOUS | Status: DC | PRN

## 2021-09-17 MED ORDER — FENTANYL CITRATE (PF) 100 MCG/2ML IJ SOLN
INTRAMUSCULAR | Status: DC | PRN
  Administered 2021-09-17 (×2): 25 ug via INTRAVENOUS
  Administered 2021-09-17: 50 ug via INTRAVENOUS
  Administered 2021-09-17 (×2): 25 ug via INTRAVENOUS

## 2021-09-17 MED ORDER — ZOLPIDEM TARTRATE 5 MG PO TABS: 5.0000 mg | ORAL_TABLET | Freq: Every evening | ORAL | Status: DC | PRN

## 2021-09-17 SURGICAL SUPPLY — 27 items
BAG DRAIN URO-CYSTO SKYTR STRL (DRAIN) ×2 IMPLANT
BAG DRN RND TRDRP ANRFLXCHMBR (UROLOGICAL SUPPLIES) ×1
BAG DRN UROCATH (DRAIN) ×1
BAG URINE DRAIN 2000ML AR STRL (UROLOGICAL SUPPLIES) ×2 IMPLANT
BAG URINE LEG 500ML (DRAIN) IMPLANT
BAND RUBBER #16 2-1/2X1/16 STR (MISCELLANEOUS) ×4 IMPLANT
CATH FOLEY 2WAY SLVR 30CC 20FR (CATHETERS) IMPLANT
CATH FOLEY 3WAY 30CC 22FR (CATHETERS) ×2 IMPLANT
CLOTH BEACON ORANGE TIMEOUT ST (SAFETY) ×2 IMPLANT
ELECT BIVAP BIPO 22/24 DONUT (ELECTROSURGICAL)
ELECT REM PT RETURN 9FT ADLT (ELECTROSURGICAL)
ELECTRD BIVAP BIPO 22/24 DONUT (ELECTROSURGICAL) IMPLANT
ELECTRODE REM PT RTRN 9FT ADLT (ELECTROSURGICAL) IMPLANT
GLOVE SURG ENC MOIS LTX SZ7 (GLOVE) ×2 IMPLANT
GOWN STRL REUS W/TWL LRG LVL3 (GOWN DISPOSABLE) ×2 IMPLANT
HOLDER FOLEY CATH W/STRAP (MISCELLANEOUS) ×2 IMPLANT
IV NS IRRIG 3000ML ARTHROMATIC (IV SOLUTION) ×28 IMPLANT
KIT TURNOVER CYSTO (KITS) ×2 IMPLANT
LOOP CUT BIPOLAR 24F LRG (ELECTROSURGICAL) ×4 IMPLANT
MANIFOLD NEPTUNE II (INSTRUMENTS) ×2 IMPLANT
PACK CYSTO (CUSTOM PROCEDURE TRAY) ×2 IMPLANT
PIN SAFETY STERILE (MISCELLANEOUS) ×2 IMPLANT
SYR 30ML LL (SYRINGE) ×2 IMPLANT
SYR TOOMEY IRRIG 70ML (MISCELLANEOUS) ×2
SYRINGE TOOMEY IRRIG 70ML (MISCELLANEOUS) ×1 IMPLANT
TUBE CONNECTING 12X1/4 (SUCTIONS) ×2 IMPLANT
TUBING UROLOGY SET (TUBING) ×2 IMPLANT

## 2021-09-17 NOTE — Anesthesia Preprocedure Evaluation (Signed)
Anesthesia Evaluation  Patient identified by MRN, date of birth, ID band Patient confused    Reviewed: Allergy & Precautions, NPO status , Patient's Chart, lab work & pertinent test results  Airway Mallampati: I  TM Distance: >3 FB Neck ROM: Full    Dental  (+) Dental Advisory Given   Pulmonary neg pulmonary ROS,    breath sounds clear to auscultation       Cardiovascular + dysrhythmias (CHB) Atrial Fibrillation and Ventricular Tachycardia + pacemaker + Cardiac Defibrillator  Rhythm:Regular Rate:Normal     Neuro/Psych Dementia negative neurological ROS     GI/Hepatic Neg liver ROS,   Endo/Other  negative endocrine ROS  Renal/GU Renal InsufficiencyRenal disease     Musculoskeletal   Abdominal   Peds  Hematology negative hematology ROS (+)   Anesthesia Other Findings   Reproductive/Obstetrics                             Anesthesia Physical Anesthesia Plan  ASA: 3  Anesthesia Plan: General   Post-op Pain Management:    Induction: Intravenous  PONV Risk Score and Plan: 2 and Dexamethasone, Ondansetron and Treatment may vary due to age or medical condition  Airway Management Planned: LMA  Additional Equipment: None  Intra-op Plan:   Post-operative Plan: Extubation in OR  Informed Consent: I have reviewed the patients History and Physical, chart, labs and discussed the procedure including the risks, benefits and alternatives for the proposed anesthesia with the patient or authorized representative who has indicated his/her understanding and acceptance.     Dental advisory given  Plan Discussed with: CRNA  Anesthesia Plan Comments:         Anesthesia Quick Evaluation

## 2021-09-17 NOTE — Op Note (Signed)
Operative Note  Preoperative diagnosis:  1.  BPH with bladder outlet obstruction  Postoperative diagnosis: 1.  BPH with bladder outlet obstruction  Procedure(s): 1.  Bipolar transurethral resection of prostate 2. Bladder biopsy  Surgeon: Rexene Alberts, MD  Assistants:  None  Anesthesia:  General  Complications:  None  EBL:  40ml  Specimens: 1. Prostate chips ID Type Source Tests Collected by Time Destination  1 : Prostate chips Tissue PATH GU resection / TURBT / partial nephrectomy SURGICAL PATHOLOGY Janith Lima, MD 09/17/2021 1145   2 : BLADDER TRIGONE BIOPSY Tissue PATH GU resection / TURBT / partial nephrectomy SURGICAL PATHOLOGY Janith Lima, MD 09/17/2021 1145     Drains/Catheters: 1.  22Fr 3 way catheter with 45ml water into balloon  Intraoperative findings:   Bilobar obstructing lobes Intravesical component of prostate overlying the left bladder trigone obscuring the right ureteral orifice. This was biopsied as it appear suspicious and not contributing to his obstruction. Excellent hemostasis with wide open prostatic channel at the end of the case.  Indication:  Eric Krueger is a 75 y.o. male with BPH with bladder outlet obstruction presenting for transurethral resection of the prostate. He had a TRUS volume of 65cm^3 and was in urinary retention.  After thorough discussion including all relevant risk benefits and alternatives, he presents today for a bipolar TURP.  Description of procedure: The indication, alternatives, benefits and risks were discussed with the patient and informed consent was obtained.  Patient was brought to the operating room table, positioned supine, secured with a safety strap.  Pneumatic compression devices were placed on the lower extremities.  After the administration of intravenous antibiotics and general anesthesia, the patient was repositioned into the dorsal lithotomy position.  All pressure points were carefully padded.  A rectal  examination was performed confirming a smooth symmetric enlarged gland.  The genitalia were prepped and draped in standard sterile manner.  A timeout was completed, verifying the correct patient, surgical procedure and positioning prior to beginning the procedure.  Isotonic sodium chloride was used for irrigation.  A 26 French continuous-flow resectoscope sheath with the visual obturator and a 30 degree lens was advanced under direct vision into the bladder.  The anterior urethra appeared normal in its entirety.The prostatic urethra was elongated with bilobar hyperplasia.  On cystoscopic evaluation, his bladder capacity appeared normal, the bladder wall was noted to expand symmetrically in all dimensions.  There were no tumors, stones or foreign bodies present. The bladder was very trabeculated with normal-appearing mucosa. His right ureteral orifice was identified however I could not locate his left ureteral orifice due to left sided intravesical protrusion of the prostatic base over the left sided trigone.   The obturator was removed and replaced by the working element with a resection loop.  The location of the ureteral orifices and the prostatic configuration were again confirmed.  Starting at the bladder neck and proceeding distally to the verumontanum a transurethral section of the prostate was performed using bipolar using energy of 4 and 5 for cutting and coagulation, respectively.  The procedure began at the bladder neck at the 5 o'clock and 7 o'clock positions and carefully carried distally to the verumontanum, resecting the intervening prostatic adenoma.  Next the left lateral lobe was resected to the level of the transverse capsular fibers.  The identical procedure was performed on the right lobe.  Attention was then directed anteriorly and the resection was completed from the 10 o'clock to 2 o'clock positions.  All bleeding vessels were fulgurated achieving meticulous hemostasis.  The bladder was  irrigated with a Toomey syringe, ensuring removal of all prostate chips which were sent to pathology for evaluation.  Having completed the resection and the chips removed, we again confirmed hemostasis with the loop with coagulating current.   I again surveyed the bladder and noted the prostatic intravesical tissue overlying his left trigone that appeared suspicious. This was biopsied with a loop and judicious electrocautery was used. This tissue was not contributing to his bladder outlet obstruction. I was unable to locate his left ureteral orifice; however, this was not near the resection site.    Upon completion of the entire procedure, the bladder and posterior urethra were reexamined, confirming open prostatic urethra and bladder neck without evidence of bleeding or perforation.  Both ureteral orifices and the external sphincter were noted to be intact.  The resectoscope was withdrawn under direct vision and a 22 French three-way Foley catheter with a 30 cc balloon was inserted into the bladder.  The balloon was inflated with 50 cc of sterile water and the catheter was placed on gentle traction.  After multiple manual irrigations ensuring light pink return of the irrigant, the procedure was terminated.  The catheter was attached to a drainage bag and continuous bladder irrigation was started with normal saline.  The patient was positioned supine.  At the end of the procedure, all counts were correct.  Patient tolerated the procedure well and was taken to the recovery room satisfactory condition.  Plan: Continuous bladder irrigation overnight with gentle Foley traction.  Plan to discharge home tomorrow with Foley catheter in place and void trial in the office in 3 days.  [facility]  Pager: [phone]

## 2021-09-17 NOTE — Anesthesia Procedure Notes (Signed)
Procedure Name: LMA Insertion Date/Time: 09/17/2021 10:23 AM Performed by: Suan Halter, CRNA Pre-anesthesia Checklist: Patient identified, Emergency Drugs available, Suction available and Patient being monitored Patient Re-evaluated:Patient Re-evaluated prior to induction Oxygen Delivery Method: Circle system utilized Preoxygenation: Pre-oxygenation with 100% oxygen Induction Type: IV induction Ventilation: Mask ventilation without difficulty LMA: LMA inserted LMA Size: 5.0 Number of attempts: 1 Airway Equipment and Method: Bite block Placement Confirmation: positive ETCO2 Tube secured with: Tape Dental Injury: Teeth and Oropharynx as per pre-operative assessment

## 2021-09-17 NOTE — Anesthesia Postprocedure Evaluation (Signed)
Anesthesia Post Note  Patient: Eric Krueger  Procedure(s) Performed: TRANSURETHRAL RESECTION OF THE PROSTATE (TURP), BIPOLAR, BLADDER BIOPSY (Prostate)     Patient location during evaluation: PACU Anesthesia Type: General Level of consciousness: awake and alert Pain management: pain level controlled Vital Signs Assessment: post-procedure vital signs reviewed and stable Respiratory status: spontaneous breathing, nonlabored ventilation, respiratory function stable and patient connected to nasal cannula oxygen Cardiovascular status: blood pressure returned to baseline and stable Postop Assessment: no apparent nausea or vomiting Anesthetic complications: no   No notable events documented.  Last Vitals:  Vitals:   09/17/21 1330 09/17/21 1400  BP: (!) 87/56 (!) 84/50  Pulse: 60 60  Resp: 16 14  Temp: 36.4 C (!) 36.4 C  SpO2: 100% 100%    Last Pain:  Vitals:   09/17/21 1330  TempSrc:   PainSc: 0-No pain                 Tiajuana Amass

## 2021-09-17 NOTE — Discharge Instructions (Signed)

## 2021-09-17 NOTE — H&P (Signed)
Office Visit Report     08/18/2021   --------------------------------------------------------------------------------   Eric Krueger  MRN: [id number]  DOB: Jun 07, 1946, 75 year old Male  SSN:    PRIMARY CARE:  Joneen Boers  REFERRING:  Janith Lima, MD  PROVIDER:  Rexene Alberts, M.D.  LOCATION:  [facility] [phone]     --------------------------------------------------------------------------------   CC/HPI: Eric Krueger is a 75 year old male seen in followup with BPH/LUTS/urinary retention.   Of note, he has a past medical history of Alzheimer's dementia, atrial fibrillation s/p pacemaker and defibrillator. He is on Eliquis. He follows with his cardiologist, Dr. Tomie China.   He is seen today with [name]. Most of HPI is obtained from her as he does not talk much and gives one word answers.   1. BPH/LUTS/urinary retention: He presented to ED on 07/06/2021 and found to be in urinary retention and acute renal failure. Renal ultrasound demonstrated moderate bilateral hydronephrosis. Creatinine was 13. Foley catheter is in place with return of 750 cc urine. His creatinine down trended and was most recently 1.4 on 07/08/2021 at his baseline. Renal ultrasound 07/07/2021 demonstrated resolved hydronephrosis. He failed a void trial on 07/22/2021.  TRUS 08/10/2021 demonstrated 65cm^3 prostate  Cystoscopy was withheld given concern of possible UTI.  -Foley catheter was exchanged today, 08/10/2021  Prior to this episode, his wife denies problems with obstructive voiding symptoms. He and his wife are interested in surgical procedure despite his comorbidities as above.   Wife states that over the past week, he has had some cognitive decline with confusion and need to walk with a walker. He has not had any fevers or chills. She notes some cloudy and foul-smelling urine.   #2. Prostate cancer screening: PSA on 03/17/2020 was normal at 3.8. He denies a family history of prostate  cancer.   Patient currently denies fever, chills, sweats, nausea, vomiting, abdominal or flank pain, gross hematuria or dysuria.     ALLERGIES: Adhesive Aspirin    MEDICATIONS: Tamsulosin Hcl 0.4 mg capsule  Atorvastatin Calcium 10 mg tablet  Eliquis 5 mg tablet  Fenofibrate 145 mg tablet  Memantine Hcl 10 mg tablet  Mirtazapine 30 mg tablet  Sotalol 80 mg tablet     GU PSH: None   NON-GU PSH: None   GU PMH: BPH w/LUTS - 07/29/2021 Encounter for Prostate Cancer screening - 07/29/2021 Urinary Retention - 07/29/2021, - 07/22/2021 Kidney Failure Unspec      PMH Notes: defibrillator    NON-GU PMH: Arrhythmia Atrial Fibrillation Depression Hypercholesterolemia    FAMILY HISTORY: None   SOCIAL HISTORY: Marital Status: Married Current Smoking Status: Patient has never smoked.   Tobacco Use Assessment Completed: Used Tobacco in last 30 days? Does not use smokeless tobacco. Has never drank.  Drinks 1 caffeinated drink per day. Patient's occupation Technical brewer.    REVIEW OF SYSTEMS:    GU Review Male:   Patient denies frequent urination, hard to postpone urination, burning/ pain with urination, get up at night to urinate, leakage of urine, stream starts and stops, trouble starting your stream, have to strain to urinate , erection problems, and penile pain.  Gastrointestinal (Upper):   Patient denies nausea, vomiting, and indigestion/ heartburn.  Gastrointestinal (Lower):   Patient denies diarrhea and constipation.  Constitutional:   Patient denies fever, night sweats, weight loss, and fatigue.  Skin:   Patient denies skin rash/ lesion and itching.  Eyes:   Patient denies blurred vision and double vision.  Ears/ Nose/ Throat:   Patient denies sore throat and sinus problems.  Hematologic/Lymphatic:   Patient denies swollen glands and easy bruising.  Cardiovascular:   Patient denies leg swelling and chest pains.  Respiratory:   Patient denies cough and shortness of  breath.  Endocrine:   Patient denies excessive thirst.  Musculoskeletal:   Patient denies back pain and joint pain.  Neurological:   Patient denies headaches and dizziness.  Psychologic:   Patient denies depression and anxiety.   VITAL SIGNS: None   MULTI-SYSTEM PHYSICAL EXAMINATION:    Constitutional: Well-nourished. No physical deformities. Normally developed. Good grooming.  Respiratory: No labored breathing, no use of accessory muscles.   Cardiovascular: Normal temperature, normal extremity pulses, no swelling, no varicosities.  Gastrointestinal: No mass, no tenderness, no rigidity, non obese abdomen.     Complexity of Data:  Source Of History:  Patient, Medical Record Summary  Lab Test Review:   PSA  Records Review:   AUA Symptom Score  Urine Test Review:   Urinalysis  X-Ray Review: Prostate Ultrasound: Reviewed Films. Reviewed Report. Discussed With Patient.     07/29/21  PSA  Total PSA 3.26 ng/mL    PROCEDURES:         Prostate Ultrasound - 01601  Length:5.81 Height:4.29 Width:5.04 Volume:65.79ML   Calcifications noted.   Bladder wall appears thickened.    Patient confirmed No Neulasta OnPro Device.        The transrectal ultrasound probe is introduced into the rectum, and the prostate is visualized. Ultrasonography is utilized throughout the procedure. At the conclusion of the procedure, the ultrasound probe is removed. The patient tolerates the procedure without complication.        Simple Foley Catheterization - [id number]  Pt was prepped and cleaned. A 16 French Foley catheter was inserted into the bladder using sterile technique. A leg bag was connected. Pt tolerated procedure well.          Urinalysis w/Scope Dipstick Dipstick Cont'd Micro  Color: Orange Bilirubin: Invalid mg/dL WBC/hpf: 20 - 40/hpf  Appearance: Cloudy Ketones: Invalid mg/dL RBC/hpf: >60/hpf  Specific Gravity: Invalid Blood: Invalid ery/uL Bacteria: Mod (26-50/hpf)  pH: Invalid Protein:  Invalid mg/dL Cystals: NS (Not Seen)  Glucose: Invalid mg/dL Urobilinogen: Invalid mg/dL Casts: NS (Not Seen)    Nitrites: Invalid Trichomonas: Not Present    Leukocyte Esterase: Invalid leu/uL Mucous: Not Present      Epithelial Cells: 0 - 5/hpf      Yeast: NS (Not Seen)      Sperm: Not Present    Notes: No dip due to color interference and QNS for spun micro Tubular renal cells present    ASSESSMENT:      ICD-10 Details  1 GU:   BPH w/LUTS - N40.1   2   Encounter for Prostate Cancer screening - Z12.5   3   Urinary Retention - R33.8   4   Acute Cystitis/UTI - N30.00    PLAN:            Medications New Meds: Bactrim Ds 800 mg-160 mg tablet 1 tablet PO BID   #14  0 Refill(s)            Schedule Return Visit/Planned Activity: 1 Month - Nurse Visit             Note: Foley exchange monthly          Document Letter(s):  Created for Patient: Clinical Summary         Notes:   #  1. BPH/LUTS/urinary retention:  -Required Foley catheter placement on 07/06/2021. Failed void trial on 07/22/2021.  -TRUS 08/18/2021 with 65cm^3 prostate  -Cystoscopy withheld today due to concern for UTI  -We discussed options and they like to proceed with TURP. We did discuss that he could have an element of hypotonic bladder however this is the best procedure for possibility of being catheter free. Discussed risk and benefits. Surgery letter sent.  -He will require cardiology clearance prior with Dr. Tomie China to hold Eliquis. Of note, he also has a Investment banker, corporate and is dependent on upon this.   #2. Prostate cancer screening: DRE 50 g, no nodules. PSA 07/29/2021 was normal at 3.26   #3. Urinary tract infection: There is concern concern for UTI given his change in cognitive status and symptoms. We will plan to treat with Bactrim. We will send urine for culture to ensure that this is sensitive.   Bilateral inguinal hernias - He is asymptomatic. Has been passing flatus and small BM yesterday.  Will refer to [facility] surgery.   CC: Dr. Marguarite Arbour, MD  CC: Dr. Tomie China, MD         Next Appointment:     Urology Preoperative H&P   Chief Complaint: BPH  History of Present Illness: Eric Krueger is a 75 y.o. male with BPH here for TURP    Past Medical History:  Diagnosis Date   Anticoagulant long-term use    eliquis--- managed by cardiology   BPH with urinary obstruction    urologist-- dr Jovani Flury   CHB (complete heart block) ([place])    CKD (chronic kidney disease), stage III ([place])    Dual implantable cardioverter-defibrillator in situ 03/2011   followed by cardiologist-- dr Rowland Lathe--   pt had DDD pacemaker and changed out for [facility] Jude)  Dual ICD 05/ 2012 for symptomatic polymorphic VT;  generator change 12/ 2016   Dyslipidemia    Early onset Alzheimer's dementia without behavioral disturbance [facility])    neurologist-- dr d. Bjorn Loser   Foley catheter in place    History of gout    remote   History of pulmonary embolism 2011   per pt wife no previous blood clots prior to and none since 2011   Moderate tricuspid regurgitation    per last echo in care everywhere 05-18-2020  moderate to severe TR , RSVP 37-68mmHg   Persistent atrial fibrillation ([place])    approx late 2011 to 01/ 2012 dx AFib  s/p DCCV 01/ 2012 unsuccessful   Sigmoid diverticulosis    SSS (sick sinus syndrome) ([place]) 01/2011   03/ 2012 s/p  permanent pacemaker  then upgraded to dual ICD 05/ 2012 due to symptomatic VT   Urine retention 06/2021   has foley catheter   Ventricular tachycardia, 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   pt had recurrent syncopal episode, 03-29-2011 pacemaker intergatted in office baseline Afib and runs of NSVT which correlated with syncopal episodes  s/p removal pacemake and placement dual ICD    Past Surgical History:  Procedure Laterality Date   CARDIAC CATHETERIZATION  01/31/2011   @[facility] ;  no intracardiac shunting identified, mild volume expansion, normal pulmonary artery pressure, and  adequate cardiac output   CARDIAC PACEMAKER PLACEMENT  01/2011   CARDIOVERSION  11/2010   unsuccessful   CATARACT EXTRACTION W/ INTRAOCULAR LENS IMPLANT Bilateral 2021   EP IMPLANTABLE DEVICE  04/04/2011   @[facility] ;  by dr m. drucker---  EP study with inducible polymorphic VT,  DDD pacemaker removed and  Implanted Dual  ICD device  (St Jude)   ICD GENERATOR CHANGE  11/10/2015   @[facility]    TONSILLECTOMY  1950   age 12    Allergies:  Allergies  Allergen Reactions   Ezetimibe-Simvastatin     myalgias   Nsaids     Cannot take d/t defibrillator/pacemaker   Saw Palmetto Diarrhea   Adhesive [Tape] Rash    Family History  Problem Relation Age of Onset   Diabetes Mellitus II Other     Social History:  reports that he has never smoked. He has never used smokeless tobacco. He reports that he does not currently use alcohol. He reports that he does not use drugs.  ROS: A complete review of systems was performed.  All systems are negative except for pertinent findings as noted.  Physical Exam:  Vital signs in last 24 hours: Temp:  [96.8 F (36 C)] 96.8 F (36 C) (10/28 0824) Krueger Rate:  [59] 59 (10/28 0824) Resp:  [16] 16 (10/28 0824) BP: (94)/(55) 94/55 (10/28 0824) SpO2:  [100 %] 100 % (10/28 0824) Weight:  [68.9 kg] 68.9 kg (10/28 0824) Constitutional:  Alert and oriented, No acute distress Cardiovascular: Regular rate and rhythm Respiratory: Normal respiratory effort, Lungs clear bilaterally GI: Abdomen is soft, nontender, nondistended, no abdominal masses GU: No CVA tenderness Lymphatic: No lymphadenopathy Neurologic: Grossly intact, no focal deficits Psychiatric: Normal mood and affect  Laboratory Data:  Recent Labs    09/17/21 0817  HGB 13.3  HCT 39.0    Recent Labs    09/17/21 0817  NA 143  K 4.2  CL 102  GLUCOSE 89  BUN 42*  CREATININE 1.50*     Results for orders placed or performed during the hospital encounter of 09/17/21 (from the past 24 hour(s))   I-STAT, chem 8     Status: Abnormal   Collection Time: 09/17/21  8:17 AM  Result Value Ref Range   Sodium 143 135 - 145 mmol/L   Potassium 4.2 3.5 - 5.1 mmol/L   Chloride 102 98 - 111 mmol/L   BUN 42 (H) 8 - 23 mg/dL   Creatinine, Ser 1.50 (H) 0.61 - 1.24 mg/dL   Glucose, Bld 89 70 - 99 mg/dL   Calcium, Ion 1.41 (H) 1.15 - 1.40 mmol/L   TCO2 30 22 - 32 mmol/L   Hemoglobin 13.3 13.0 - 17.0 g/dL   HCT 39.0 39.0 - 52.0 %   No results found for this or any previous visit (from the past 240 hour(s)).  Renal Function: Recent Labs    09/17/21 0817  CREATININE 1.50*   Estimated Creatinine Clearance: 40.2 mL/min (A) (by C-G formula based on SCr of 1.5 mg/dL (H)).  Radiologic Imaging: No results found.  I independently reviewed the above imaging studies.  Assessment and Plan Eric Krueger is a 75 y.o. male with BPH here for TURP.  Ok to proceed.    Eric R. Mattalynn Crandle MD 09/17/2021, 10:01 AM  [facility] Pager: [phone]): [phone]

## 2021-09-17 NOTE — Progress Notes (Signed)
Pt's covid test no had resulted in epic as of 1040 on 09-16-2021.  Called covid testing site @ 1040 on 09-16-2021 was told pt was negative.

## 2021-09-17 NOTE — OR Nursing (Signed)
Foley catheter removed in [facility] OR #2 by C. Sonia Bromell, RN 

## 2021-09-17 NOTE — Transfer of Care (Signed)
Immediate Anesthesia Transfer of Care Note  Patient: Eric Krueger  Procedure(s) Performed: Procedure(s) (LRB): TRANSURETHRAL RESECTION OF THE PROSTATE (TURP), BIPOLAR, BLADDER BIOPSY (N/A)  Patient Location: PACU  Anesthesia Type: General  Level of Consciousness: awake and sedated   Airway & Oxygen Therapy: Patient Spontanous Breathing and Patient connected to face mask oxygen  Post-op Assessment: Report given to PACU RN and Post -op Vital signs reviewed and stable  Post vital signs: Reviewed and stable  Complications: No apparent anesthesia complications  Last Vitals:  Vitals Value Taken Time  BP 106/50 09/17/21 1220  Temp    Pulse 60 09/17/21 1223  Resp 17 09/17/21 1223  SpO2 100 % 09/17/21 1223  Vitals shown include unvalidated device data.  Last Pain:  Vitals:   09/17/21 0824  TempSrc: Oral  PainSc: 0-No pain      Patients Stated Pain Goal:  (patient is unable to report a pain goal) (68/86/48 4720)  Complications: No notable events documented.

## 2021-09-18 DIAGNOSIS — N401 Enlarged prostate with lower urinary tract symptoms: Secondary | ICD-10-CM | POA: Diagnosis not present

## 2021-09-18 LAB — BASIC METABOLIC PANEL
Anion gap: 4 — ABNORMAL LOW (ref 5–15)
BUN: 38 mg/dL — ABNORMAL HIGH (ref 8–23)
CO2: 22 mmol/L (ref 22–32)
Calcium: 8.8 mg/dL — ABNORMAL LOW (ref 8.9–10.3)
Chloride: 109 mmol/L (ref 98–111)
Creatinine, Ser: 1.38 mg/dL — ABNORMAL HIGH (ref 0.61–1.24)
GFR, Estimated: 53 mL/min — ABNORMAL LOW (ref >60)
Glucose, Bld: 83 mg/dL (ref 70–99)
Potassium: 3.9 mmol/L (ref 3.5–5.1)
Sodium: 135 mmol/L (ref 135–145)

## 2021-09-18 LAB — HEMOGLOBIN AND HEMATOCRIT, BLOOD
HCT: 32.6 % — ABNORMAL LOW (ref 39.0–52.0)
Hemoglobin: 10.4 g/dL — ABNORMAL LOW (ref 13.0–17.0)

## 2021-09-18 MED ORDER — ACETAMINOPHEN 325 MG PO TABS
ORAL_TABLET | ORAL | Status: AC
  Filled 2021-09-18: qty 2

## 2021-09-18 MED ORDER — ELIQUIS 5 MG PO TABS: 5.0000 mg | ORAL_TABLET | Freq: Two times a day (BID) | ORAL | 11 refills | Status: AC

## 2021-09-18 NOTE — Discharge Summary (Signed)
Physician Discharge Summary  Patient ID: Eric Krueger MRN: [id number] DOB/AGE: [birth year] 75 y.o.  Admit date: 09/17/2021 Discharge date: 09/18/2021  Admission Diagnoses:  Discharge Diagnoses:  Active Problems:   BPH (benign prostatic hyperplasia)   Discharged Condition: good  Hospital Course: Eric Krueger was observed overnight following TURP.  He has done well.  Urine is light pink with CBI off.  He is tolerating a regular diet.  Ready for discharge.  Consults: None  Significant Diagnostic Studies: None  Treatments: surgery: Transurethral resection of prostate  Discharge Exam: Blood pressure (!) 99/49, pulse 62, temperature 98.1 F (36.7 C), temperature source Oral, resp. rate 18, height 6\' 2"  (1.88 m), weight 68.9 kg, SpO2 100 %. Sitting in bed, alert and oriented Talking with his wife, eating breakfast Cardiovascular-regular rate and rhythm Lungs-regular effort and depth Abdomen-soft and nontender Extremities-no calf pain or swelling GU-Foley catheter in place urine light pink.  No clots.  Disposition: Discharge disposition: 01-Home or Self Care       Discharge Instructions     Continue foley catheter   Complete by: As directed    Discharge patient   Complete by: As directed    Discharge disposition: 01-Home or Self Care   Discharge patient date: 09/18/2021      Allergies as of 09/18/2021       Reactions   Ezetimibe-simvastatin    myalgias   Nsaids    Cannot take d/t defibrillator/pacemaker   Saw Palmetto Diarrhea   Adhesive [tape] Rash        Medication List     TAKE these medications    acetaminophen 500 MG tablet Commonly known as: TYLENOL Take 500 mg by mouth every 6 (six) hours as needed.   atorvastatin 10 MG tablet Commonly known as: LIPITOR Take 10 mg by mouth at bedtime.   docusate sodium 100 MG capsule Commonly known as: Colace Take 1 capsule (100 mg total) by mouth daily as needed for up to 30 doses.   Eliquis 5 MG  Tabs tablet Generic drug: apixaban Take 1 tablet (5 mg total) by mouth 2 (two) times daily. Start taking on: September 19, 2021   fenofibrate 145 MG tablet Commonly known as: TRICOR Take 145 mg by mouth daily.   memantine 10 MG tablet Commonly known as: NAMENDA Take 10 mg by mouth 2 (two) times daily. 2 tab daily for memory   mirtazapine 15 MG tablet Commonly known as: REMERON Take 15 mg by mouth at bedtime.   oxyCODONE-acetaminophen 5-325 MG tablet Commonly known as: Percocet Take 1 tablet by mouth every 4 (four) hours as needed for up to 15 doses for severe pain.   sotalol 80 MG tablet Commonly known as: BETAPACE Take 80 mg by mouth 2 (two) times daily.   tamsulosin 0.4 MG Caps capsule Commonly known as: FLOMAX Take 0.4 mg by mouth daily.        Follow-up Information     [facility] Follow up on 09/20/2021.   Why: 9AM Contact information: [address] [place]                Signed: Festus Aloe 09/18/2021, 8:58 AM

## 2021-09-20 ENCOUNTER — Encounter (HOSPITAL_BASED_OUTPATIENT_CLINIC_OR_DEPARTMENT_OTHER): Payer: Self-pay | Admitting: Urology

## 2021-09-21 LAB — SURGICAL PATHOLOGY

## 2021-09-22 ENCOUNTER — Encounter (HOSPITAL_BASED_OUTPATIENT_CLINIC_OR_DEPARTMENT_OTHER): Payer: Self-pay | Admitting: Urology

## 2021-09-28 ENCOUNTER — Other Ambulatory Visit (HOSPITAL_COMMUNITY): Payer: Self-pay | Admitting: Urology

## 2021-09-28 DIAGNOSIS — C61 Malignant neoplasm of prostate: Secondary | ICD-10-CM

## 2021-10-05 ENCOUNTER — Telehealth: Payer: Self-pay | Admitting: Oncology

## 2021-10-05 NOTE — Telephone Encounter (Signed)
Scheduled appt per 11/9 referral. Pt's wife is aware of appt date and time.

## 2021-10-12 ENCOUNTER — Other Ambulatory Visit: Payer: Self-pay

## 2021-10-12 ENCOUNTER — Ambulatory Visit (HOSPITAL_COMMUNITY)
Admission: RE | Admit: 2021-10-12 | Discharge: 2021-10-12 | Disposition: A | Discharge status: Home / Self Care | Payer: Medicare Other | Source: Ambulatory Visit | Attending: Urology | Admitting: Urology

## 2021-10-12 DIAGNOSIS — C61 Malignant neoplasm of prostate: Secondary | ICD-10-CM | POA: Insufficient documentation

## 2021-10-12 MED ORDER — PIFLIFOLASTAT F 18 (PYLARIFY) INJECTION
9.0000 | Freq: Once | INTRAVENOUS | Status: AC
  Administered 2021-10-12: 9.47 via INTRAVENOUS

## 2021-10-19 ENCOUNTER — Inpatient Hospital Stay: Payer: Medicare Other | Attending: Oncology | Admitting: Oncology

## 2021-10-19 ENCOUNTER — Other Ambulatory Visit: Payer: Self-pay

## 2021-10-19 ENCOUNTER — Other Ambulatory Visit: Payer: Self-pay | Admitting: Oncology

## 2021-10-19 VITALS — BP 107/63 | HR 84 | Temp 97.9°F | Resp 18 | Wt 146.4 lb

## 2021-10-19 DIAGNOSIS — C61 Malignant neoplasm of prostate: Secondary | ICD-10-CM | POA: Diagnosis not present

## 2021-10-19 DIAGNOSIS — C7951 Secondary malignant neoplasm of bone: Secondary | ICD-10-CM | POA: Diagnosis not present

## 2021-10-19 NOTE — Progress Notes (Signed)
Introduced myself to patient, and his wife, as the prostate nurse navigator and discussed my role.  No barriers to care identified at this time.  He is here to discuss his radiation treatment options.  I gave him my business card and asked him to call me with questions or concerns.  Verbalized understanding.

## 2021-10-19 NOTE — Progress Notes (Signed)
Reason for the request:    Prostate cancer  HPI: I was asked by Dr. Abner Greenspan to evaluate [name] with a diagnosis of prostate cancer.  He is a 75 year old man with history of coronary artery disease, pulmonary embolism as well as chronic renal insufficiency among other comorbid conditions.  He presented to the emergency department in August 2022 with lower urinary tract symptoms and found to have bilateral hydronephrosis and increased creatinine.  He underwent transurethral resection of the prostate completed on September 17, 2021.  The pathology showed a Gleason score 5+ 4 = with invasion into the bladder trigone.  His creatinine did improve down to 1.38 after his procedure.  PSMA PET scan obtained on October 12, 2021 showed extensive nodal metastasis with uptake into the iliac nodes, periaortic lymph nodes and mediastinum as well as supraclavicular nodes.  Hydroureter and hydronephrosis was noted in the left kidney.  Skeletal metastasis was also noted in the pelvis, proximal femur spine and sternum.   His PSA in April 2021 was 3.8 and it was 3.26 in September 2022.  He was started on adjuvant deprivation therapy under the care of Dr. Abner Greenspan in November 2022.  Clinically, he reports no major complaints although his history limited by his dementia.  He denies any back pain or bone pain at this time.  He still has a Foley catheter in place which has caused occasional pain and discomfort in his penis.  He does not report any headaches, blurry vision, syncope or seizures. Does not report any fevers, chills or sweats.  Does not report any cough, wheezing or hemoptysis.  Does not report any chest pain, palpitation, orthopnea or leg edema.  Does not report any nausea, vomiting or abdominal pain.  Does not report any constipation or diarrhea.  Does not report any skeletal complaints.    Does not report frequency, urgency or hematuria.  Does not report any skin rashes or lesions. Does not report any heat or cold  intolerance.  Does not report any lymphadenopathy or petechiae.  Does not report any anxiety or depression.  Remaining review of systems is negative.     Past Medical History:  Diagnosis Date   Anticoagulant long-term use    eliquis--- managed by cardiology   BPH with urinary obstruction    urologist-- dr gay   CHB (complete heart block) ([place])    CKD (chronic kidney disease), stage III ([place])    Dual implantable cardioverter-defibrillator in situ 03/2011   followed by cardiologist-- dr Rowland Lathe--   pt had DDD pacemaker and changed out for [facility] Jude)  Dual ICD 05/ 2012 for symptomatic polymorphic VT;  generator change 12/ 2016   Dyslipidemia    Early onset Alzheimer's dementia without behavioral disturbance [facility])    neurologist-- dr d. Bjorn Loser   Foley catheter in place    History of gout    remote   History of pulmonary embolism 2011   per pt wife no previous blood clots prior to and none since 2011   Moderate tricuspid regurgitation    per last echo in care everywhere 05-18-2020  moderate to severe TR , RSVP 37-80mmHg   Persistent atrial fibrillation ([place])    approx late 2011 to 01/ 2012 dx AFib  s/p DCCV 01/ 2012 unsuccessful   Sigmoid diverticulosis    SSS (sick sinus syndrome) [facility]) 01/2011   03/ 2012 s/p  permanent pacemaker  then upgraded to dual ICD 05/ 2012 due to symptomatic VT   Urine retention 06/2021  has foley catheter   Ventricular tachycardia, 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   pt had recurrent syncopal episode, 03-29-2011 pacemaker intergatted in office baseline Afib and runs of NSVT which correlated with syncopal episodes  s/p removal pacemake and placement dual ICD  :   Past Surgical History:  Procedure Laterality Date   CARDIAC CATHETERIZATION  01/31/2011   @[facility] ;  no intracardiac shunting identified, mild volume expansion, normal pulmonary artery pressure, and adequate cardiac output   CARDIAC PACEMAKER PLACEMENT  01/2011   CARDIOVERSION  11/2010   unsuccessful   CATARACT  EXTRACTION W/ INTRAOCULAR LENS IMPLANT Bilateral 2021   EP IMPLANTABLE DEVICE  04/04/2011   @[facility] ;  by dr Jerilynn Mages. drucker---  EP study with inducible polymorphic VT,  DDD pacemaker removed and  Implanted Dual ICD device  ([employer] Jude)   ICD GENERATOR CHANGE  11/10/2015   @[facility]    TONSILLECTOMY  1952   age 23   TRANSURETHRAL RESECTION OF PROSTATE N/A 09/17/2021   Procedure: TRANSURETHRAL RESECTION OF THE PROSTATE (TURP), BIPOLAR, BLADDER BIOPSY;  Surgeon: Janith Lima, MD;  Location: [facility];  Service: Urology;  Laterality: N/A;  :   Current Outpatient Medications:    acetaminophen (TYLENOL) 500 MG tablet, Take 500 mg by mouth every 6 (six) hours as needed., Disp: , Rfl:    atorvastatin (LIPITOR) 10 MG tablet, Take 10 mg by mouth at bedtime., Disp: , Rfl:    docusate sodium (COLACE) 100 MG capsule, Take 1 capsule (100 mg total) by mouth daily as needed for up to 30 doses., Disp: 30 capsule, Rfl: 0   ELIQUIS 5 MG TABS tablet, Take 1 tablet (5 mg total) by mouth 2 (two) times daily., Disp: 60 tablet, Rfl: 11   fenofibrate (TRICOR) 145 MG tablet, Take 145 mg by mouth daily., Disp: , Rfl:    memantine (NAMENDA) 10 MG tablet, Take 10 mg by mouth 2 (two) times daily. 2 tab daily for memory, Disp: , Rfl:    mirtazapine (REMERON) 15 MG tablet, Take 15 mg by mouth at bedtime., Disp: , Rfl:    oxyCODONE-acetaminophen (PERCOCET) 5-325 MG tablet, Take 1 tablet by mouth every 4 (four) hours as needed for up to 15 doses for severe pain., Disp: 15 tablet, Rfl: 0   sotalol (BETAPACE) 80 MG tablet, Take 80 mg by mouth 2 (two) times daily., Disp: , Rfl: 0   tamsulosin (FLOMAX) 0.4 MG CAPS capsule, Take 0.4 mg by mouth daily., Disp: , Rfl: :   Allergies  Allergen Reactions   Ezetimibe-Simvastatin     myalgias   Nsaids     Cannot take d/t defibrillator/pacemaker   Saw Palmetto Diarrhea   Adhesive [Tape] Rash  :   Family History  Problem Relation Age of Onset   Diabetes Mellitus II Other    :   Social History   Socioeconomic History   Marital status: Married    Spouse name: Not on file   Number of children: Not on file   Years of education: Not on file   Highest education level: Not on file  Occupational History   Not on file  Tobacco Use   Smoking status: Never   Smokeless tobacco: Never  Vaping Use   Vaping Use: Never used  Substance and Sexual Activity   Alcohol use: Not Currently   Drug use: Never   Sexual activity: Not on file  Other Topics Concern   Not on file  Social History Narrative   Not on file   Social  Determinants of Health   Financial Resource Strain: Not on file  Food Insecurity: Not on file  Transportation Needs: Not on file  Physical Activity: Not on file  Stress: Not on file  Social Connections: Not on file  Intimate Partner Violence: Not on file  :  Pertinent items are noted in HPI.  Exam: Blood pressure 107/63, pulse 84, temperature 97.9 F (36.6 C), temperature source Temporal, resp. rate 18, weight 146 lb 6.4 oz (66.4 kg), SpO2 100 %. ECOG 2 General appearance: alert and cooperative appeared without distress.  Appeared chronically ill. Head: atraumatic without any abnormalities. Eyes: conjunctivae/corneas clear. PERRL.  Sclera anicteric. Throat: lips, mucosa, and tongue normal; without oral thrush or ulcers. Resp: clear to auscultation bilaterally without rhonchi, wheezes or dullness to percussion. Cardio: regular rate and rhythm, S1, S2 normal, no murmur, click, rub or gallop GI: soft, non-tender; bowel sounds normal; no masses,  no organomegaly Skin: Skin color, texture, turgor normal. No rashes or lesions Lymph nodes: Cervical, supraclavicular, and axillary nodes normal. Neurologic: Grossly normal without any motor, sensory or deep tendon reflexes. Musculoskeletal: No joint deformity or effusion.    NM PET (PSMA) SKULL TO MID THIGH  Result Date: 10/13/2021 CLINICAL DATA:  Prostate carcinoma with biochemical  recurrence. EXAM: NUCLEAR MEDICINE PET SKULL BASE TO THIGH TECHNIQUE: 9.47 mCi F18 Piflufolastat (Pylarify) was injected intravenously. Full-ring PET imaging was performed from the skull base to thigh after the radiotracer. CT data was obtained and used for attenuation correction and anatomic localization. COMPARISON:  None. FINDINGS: NECK No radiotracer activity in neck lymph nodes. Incidental CT finding: None CHEST Intensely hypermetabolic mediastinal lymph nodes. These nodes are very small with intense radiotracer activity. For example RIGHT lower paratracheal node measuring less than 5 mm with SUV max equal 50.5 (image 96). Intense metabolic activity in the RIGHT supraclavicular nodal station corresponds to a 3 mm node (image 72/4) with SUV max equal 7.5. No suspicious pulmonary nodules Incidental CT finding: None ABDOMEN/PELVIS Prostate: No clear activity within the prostate gland other than along the urethra/ Foley catheter consistent urine activity. There is poor excretion of radiotracer from the LEFT kidney compared to the RIGHT. There is hydroureter on the LEFT Dom the level of the LEFT vascular junction. Concern for a tumor involvement of the distal LEFT ureter radiotracer avid thickening at this level on image 204 Lymph nodes: Intensely radiotracer avid pelvic lymph nodes along LEFT and RIGHT iliac chains. Example LEFT external iliac node measuring 8 mm (image 194) with SUV max equal 23.2. Similar nodes at the LEFT common iliac nodal station with SUV max equal 34. Intense radiotracer avid periaortic retroperitoneal nodes. For example, node LEFT aorta at the level the kidneys measuring 11 mm with SUV max equal 39. Liver: No evidence of liver metastasis Incidental CT finding: SKELETON Widespread intensely radiotracer avid skeletal metastasis. Broad lesion involving entirety of the LEFT ischial pelvic bone and medial iliac bone. Uniform activity through the acetabula with SUV max equal 25.9. Minimal change  on the CT portion exam. Intense broad activity within the RIGHT sacral ala with SUV max equal 39.4. Scattered activity in the thoracic skeleton. There is involvement of the posterior elements at T3. Lesion in the T 6 vertebral body. Again minimal change on the CT. Additional small lesions involve the LEFT and RIGHT proximal femurs. IMPRESSION: 1. Extensive nodal metastasis with intense radiotracer activity involving the iliac nodes, periaortic retroperitoneal nodes and extending to mediastinum. Tiny RIGHT supraclavicular metastatic node. 2. Poor excretion from the  LEFT kidney related to hydroureter/hydronephrosis. Concern for obstructing lesion at the LEFT vesicoureteral junction. Favor local prostate cancer. 3. Extensive intense radiotracer skeletal metastasis involving the pelvis, proximal femurs, spine and sternum. These lesions are essentially occult on CT imaging. These results will be called to the ordering clinician or representative by the Radiologist Assistant, and communication documented in the PACS or [employer]. Electronically Signed   By: Suzy Bouchard M.D.   On: 10/13/2021 14:42    Assessment and Plan:    75 year old with:  1.  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  He was found to have a Gleason score of 9, PSA 3.2 and a PET scan showed extensive disease.  The natural course of this disease was reviewed at this time with the patient and his family accompanying him today.  He appears to have a high volume, high-grade disease with poor prognosis given his poorly differentiated features and low PSA.  He was started on androgen deprivation therapy which should remains the cornerstone of treating advanced prostate cancer.  Therapy escalation options at this time including androgen receptor pathway inhibitors, chemotherapy and combination of the above were discussed.  Given his poor performance status and overall  multiple comorbid conditions and do not think he is a great candidate for any therapy escalation at this time.  He is certainly not a candidate for systemic chemotherapy and additional oral antiandrogen therapy could be considered in the future.  I have recommended a period of observation and next 6 to 8 weeks and reevaluate at that time.  If he is gaining weight and continues to thrive therapy escalation will be considered.  If he is declining with poor response to treatment, hospice will be considered at that time.  2.  Androgen deprivation therapy: He will be continued indefinitely currently receiving under the care of Dr. Abner Greenspan.  He will have a repeat Eligard in the next few weeks.  3.  Prognosis: This was discussed today in detail.  Overall prognosis is poor given his advanced malignancy with comorbid conditions.  He is already losing weight and experiencing failure to thrive exacerbated by time of time his dementia.   4.  Follow-up: In 2 months for repeat follow-up.  60  minutes were dedicated to this visit. The time was spent on reviewing laboratory data, imaging studies, discussing treatment options, and answering questions regarding future plan.    A copy of this consult has been forwarded to the requesting physician.

## 2021-12-21 ENCOUNTER — Inpatient Hospital Stay (HOSPITAL_BASED_OUTPATIENT_CLINIC_OR_DEPARTMENT_OTHER): Payer: Medicare Other | Admitting: Oncology

## 2021-12-21 ENCOUNTER — Inpatient Hospital Stay: Payer: Medicare Other | Attending: Oncology

## 2021-12-21 ENCOUNTER — Other Ambulatory Visit: Payer: Self-pay

## 2021-12-21 VITALS — BP 154/58 | HR 78 | Temp 97.8°F | Resp 18 | Ht 74.0 in | Wt 170.1 lb

## 2021-12-21 DIAGNOSIS — C61 Malignant neoplasm of prostate: Secondary | ICD-10-CM

## 2021-12-21 LAB — CBC WITH DIFFERENTIAL (CANCER CENTER ONLY)
Abs Immature Granulocytes: 0.02 10*3/uL (ref 0.00–0.07)
Basophils Absolute: 0.1 10*3/uL (ref 0.0–0.1)
Basophils Relative: 1 %
Eosinophils Absolute: 0.4 10*3/uL (ref 0.0–0.5)
Eosinophils Relative: 8 %
HCT: 37.3 % — ABNORMAL LOW (ref 39.0–52.0)
Hemoglobin: 12.2 g/dL — ABNORMAL LOW (ref 13.0–17.0)
Immature Granulocytes: 0 %
Lymphocytes Relative: 18 %
Lymphs Abs: 0.9 10*3/uL (ref 0.7–4.0)
MCH: 30 pg (ref 26.0–34.0)
MCHC: 32.7 g/dL (ref 30.0–36.0)
MCV: 91.9 fL (ref 80.0–100.0)
Monocytes Absolute: 0.4 10*3/uL (ref 0.1–1.0)
Monocytes Relative: 8 %
Neutro Abs: 3.2 10*3/uL (ref 1.7–7.7)
Neutrophils Relative %: 65 %
Platelet Count: 215 10*3/uL (ref 150–400)
RBC: 4.06 MIL/uL — ABNORMAL LOW (ref 4.22–5.81)
RDW: 15.9 % — ABNORMAL HIGH (ref 11.5–15.5)
WBC Count: 5 10*3/uL (ref 4.0–10.5)
nRBC: 0 % (ref 0.0–0.2)

## 2021-12-21 LAB — CMP (CANCER CENTER ONLY)
ALT: 10 U/L (ref 0–44)
AST: 24 U/L (ref 15–41)
Albumin: 3.9 g/dL (ref 3.5–5.0)
Alkaline Phosphatase: 40 U/L (ref 38–126)
Anion gap: 5 (ref 5–15)
BUN: 46 mg/dL — ABNORMAL HIGH (ref 8–23)
CO2: 27 mmol/L (ref 22–32)
Calcium: 9.5 mg/dL (ref 8.9–10.3)
Chloride: 109 mmol/L (ref 98–111)
Creatinine: 1.77 mg/dL — ABNORMAL HIGH (ref 0.61–1.24)
GFR, Estimated: 39 mL/min — ABNORMAL LOW (ref >60)
Glucose, Bld: 93 mg/dL (ref 70–99)
Potassium: 3.9 mmol/L (ref 3.5–5.1)
Sodium: 141 mmol/L (ref 135–145)
Total Bilirubin: 0.5 mg/dL (ref 0.3–1.2)
Total Protein: 6.5 g/dL (ref 6.5–8.1)

## 2021-12-21 NOTE — Progress Notes (Signed)
Hematology and Oncology Follow Up Visit  Eric Krueger [id number] 04/14/1946 76 y.o. 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  His PSA was up to 6 at that time.  He was found to have a Gleason score 4+4 = 9   Prior Therapy:  He is status post prostate biopsy completed on September 17, 2021.  Current therapy:  Androgen deprivation therapy alone started in December 2022.  Interim History: [name] returns today for a follow-up visit.  Since the last visit, he continues to be on androgen deprivation without any major complications.  He does have issues with dementia but his overall quality of life remains unchanged.  He has a catheter in place permanently.  He denies any bone pain, pathological fractures or falls.     Medications: I have reviewed the patient's current medications.  Current Outpatient Medications  Medication Sig Dispense Refill   acetaminophen (TYLENOL) 500 MG tablet Take 500 mg by mouth every 6 (six) hours as needed.     atorvastatin (LIPITOR) 10 MG tablet Take 10 mg by mouth at bedtime.     docusate sodium (COLACE) 100 MG capsule Take 1 capsule (100 mg total) by mouth daily as needed for up to 30 doses. 30 capsule 0   ELIQUIS 5 MG TABS tablet Take 1 tablet (5 mg total) by mouth 2 (two) times daily. 60 tablet 11   fenofibrate (TRICOR) 145 MG tablet Take 145 mg by mouth daily.     memantine (NAMENDA) 10 MG tablet Take 10 mg by mouth 2 (two) times daily. 2 tab daily for memory     mirtazapine (REMERON) 15 MG tablet Take 15 mg by mouth at bedtime.     oxyCODONE-acetaminophen (PERCOCET) 5-325 MG tablet Take 1 tablet by mouth every 4 (four) hours as needed for up to 15 doses for severe pain. 15 tablet 0   sotalol (BETAPACE) 80 MG tablet Take 80 mg by mouth 2 (two) times daily.  0   tamsulosin (FLOMAX) 0.4 MG CAPS capsule Take  0.4 mg by mouth daily.     No current facility-administered medications for this visit.     Allergies:  Allergies  Allergen Reactions   Ezetimibe-Simvastatin     myalgias   Nsaids     Cannot take d/t defibrillator/pacemaker   Saw Palmetto Diarrhea   Adhesive [Tape] Rash     Physical Exam: Blood pressure (!) 154/58, pulse 78, temperature 97.8 F (36.6 C), temperature source Temporal, resp. rate 18, height 6\' 2"  (1.88 m), weight 170 lb 1.6 oz (77.2 kg), SpO2 100 %. ECOG: 1 General appearance: alert and cooperative appeared without distress. Head: Normocephalic, without obvious abnormality Oropharynx: No oral thrush or ulcers. Eyes: No scleral icterus.  Pupils are equal and round reactive to light. Lymph nodes: Cervical, supraclavicular, and axillary nodes normal. Heart:regular rate and rhythm, S1, S2 normal, no murmur, click, rub or gallop Lung:chest clear, no wheezing, rales, normal symmetric air entry Abdomin: soft, non-tender, without masses or organomegaly. Neurological: No motor, sensory deficits.  Intact deep tendon reflexes. Skin: No rashes or lesions.  No ecchymosis or petechiae. Musculoskeletal: No joint deformity or effusion. Psychiatric: Mood and affect are appropriate.    Lab Results: Lab Results  Component Value Date   WBC 5.0 12/21/2021   HGB 12.2 (L) 12/21/2021   HCT 37.3 (L) 12/21/2021   MCV 91.9 12/21/2021   PLT 215 12/21/2021  Chemistry      Component Value Date/Time   NA 141 12/21/2021 1118   K 3.9 12/21/2021 1118   CL 109 12/21/2021 1118   CO2 27 12/21/2021 1118   BUN 46 (H) 12/21/2021 1118   CREATININE 1.77 (H) 12/21/2021 1118      Component Value Date/Time   CALCIUM 9.5 12/21/2021 1118   ALKPHOS 40 12/21/2021 1118   AST 24 12/21/2021 1118   ALT 10 12/21/2021 1118   BILITOT 0.5 12/21/2021 1118         Impression and Plan:  76 year old with:  1.  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  He was found to have a Gleason score of 9, PSA 3.2.  He appears to have poorly differentiated disease.   He is currently on androgen deprivation therapy alone.  Risks and benefits of therapy escalation were reviewed.  His performance status is marginal and has multiple comorbid conditions and I recommended no additional treatment at this time.  He is continuing to thrive reasonably well at this time without any additional treatment.  Androgen receptor pathway inhibitors, chemotherapy among others were reviewed but he is a poor candidate for those.   2.  Androgen deprivation therapy: He will receive Eligard in the next 6 months.  He continues to receive that under the care of [facility] indefinitely.   3.  Prognosis: Therapy is palliative his prognosis is overall poor given his marginal performance status and other comorbidities.     4.  Follow-up: In 6 months for follow-up.   30  minutes were spent on this encounter.  The time was dedicated to reviewing laboratory data, disease status update and outlining future plan of care discussion.     Eric Button, MD 1/31/202312:00 PM

## 2021-12-22 ENCOUNTER — Telehealth: Payer: Self-pay | Admitting: *Deleted

## 2021-12-22 LAB — TESTOSTERONE: Testosterone: <3 ng/dL — ABNORMAL LOW (ref 264–916)

## 2021-12-22 LAB — PROSTATE-SPECIFIC AG, SERUM (LABCORP): Prostate Specific Ag, Serum: 0.8 ng/mL (ref 0.0–4.0)

## 2021-12-22 NOTE — Telephone Encounter (Addendum)
Contacted Ms. Florek  - provided information as requested by MD. Ms. Sherryle Lis verbalized understanding of information.   ----- Message from Wyatt Portela, MD sent at 12/22/2021  8:32 AM EST ----- Please let his wife know his PSA is down. This means the treatment is working like we discussed

## 2022-03-02 ENCOUNTER — Telehealth: Payer: Self-pay

## 2022-03-02 ENCOUNTER — Telehealth: Payer: Self-pay | Admitting: Oncology

## 2022-03-02 NOTE — Telephone Encounter (Signed)
Pt's wife called requesting to speak with Dr. Alen Blew. Mrs. Apostol had the following concerns: ? ?Patient saw PCP on 02/23/22 and labs results were concerning to pt's family. PSA 4.6 and creatinine was 1.98. "does this mean he is progressing". ?Dr. Abner Greenspan has suggested adding additional "cancer medications" that will cost between $2,000 - $3,000  and she states that this would be unaffordable.  She is also seeking to understand the benefit of adding medications. ?Dr. Abner Greenspan has talked to them regarding Supra Pubic catheter vs "going straight into the kidney".   ?The family would like a "brutally honest" discussion regarding prognosis.  ? ?Routed to provider. ? ? ?

## 2022-03-02 NOTE — Telephone Encounter (Signed)
Per 4/12 in basket.  Called pt, spoke to pt wife.  She confirmed appointment  ?

## 2022-03-07 ENCOUNTER — Inpatient Hospital Stay: Payer: Medicare Other | Attending: Oncology | Admitting: Oncology

## 2022-03-07 DIAGNOSIS — C61 Malignant neoplasm of prostate: Secondary | ICD-10-CM | POA: Diagnosis not present

## 2022-03-07 NOTE — Progress Notes (Signed)
Hematology and Oncology Follow Up for Telemedicine Visits ? ?Eric Krueger ?[id number] ?04/15/1946 76 y.o. ?03/07/2022 8:30 AM ?Joneen Boers, MDBoals, Marjory Lies, MD  ? ?I connected with Mr. Lacko on 03/07/22 at  9:00 AM EDT by telephone visit and verified that I am speaking with the correct person using two identifiers. ?  ?I discussed the limitations, risks, security and privacy concerns of performing an evaluation and management service by telemedicine and the availability of in-person appointments. I also discussed with the patient that there may be a patient responsible charge related to this service. The patient expressed understanding and agreed to proceed. ? ?Other persons participating in the visit and their role in the encounter: His wife [name] ? ?Patient's location: Home ?Provider's location: Office ? ? ? ?Principle Diagnosis: 76 year old man with prostate cancer diagnosed in November 2022 with advanced disease including bone and lymphadenopathy.  He was found to have castration-sensitive with PSA of 6, Gleason score 4+4 = 9 ? ? ?Prior Therapy: He is status post prostate biopsy completed on September 17, 2021. ?  ?Current therapy: ?  ?Androgen deprivation therapy alone started in December 2022. ? ? ? ?Interim History: Mr. Campi has no complaints according to his wife.  He continues to have Foley catheter that is draining close to 1500 cc a day.  He does not report any complaints related to his cancer. ? ? ?Medications: I have reviewed the patient's current medications.  ?Current Outpatient Medications  ?Medication Sig Dispense Refill  ? acetaminophen (TYLENOL) 500 MG tablet Take 500 mg by mouth every 6 (six) hours as needed.    ? atorvastatin (LIPITOR) 10 MG tablet Take 10 mg by mouth at bedtime.    ? docusate sodium (COLACE) 100 MG capsule Take 1 capsule (100 mg total) by mouth daily as needed for up to 30 doses. 30 capsule 0  ? ELIQUIS 5 MG TABS tablet Take 1 tablet (5 mg total) by mouth 2 (two) times  daily. 60 tablet 11  ? fenofibrate (TRICOR) 145 MG tablet Take 145 mg by mouth daily.    ? memantine (NAMENDA) 10 MG tablet Take 10 mg by mouth 2 (two) times daily. 2 tab daily for memory    ? mirtazapine (REMERON) 15 MG tablet Take 15 mg by mouth at bedtime.    ? oxyCODONE-acetaminophen (PERCOCET) 5-325 MG tablet Take 1 tablet by mouth every 4 (four) hours as needed for up to 15 doses for severe pain. 15 tablet 0  ? sotalol (BETAPACE) 80 MG tablet Take 80 mg by mouth 2 (two) times daily.  0  ? tamsulosin (FLOMAX) 0.4 MG CAPS capsule Take 0.4 mg by mouth daily.    ? ?No current facility-administered medications for this visit.  ? ? ? ?Allergies:  ?Allergies  ?Allergen Reactions  ? Ezetimibe-Simvastatin   ?  myalgias  ? Nsaids   ?  Cannot take d/t defibrillator/pacemaker  ? Saw Palmetto Diarrhea  ? Adhesive [Tape] Rash  ? ? ? ? ?Lab Results: ?Lab Results  ?Component Value Date  ? WBC 5.0 12/21/2021  ? HGB 12.2 (L) 12/21/2021  ? HCT 37.3 (L) 12/21/2021  ? MCV 91.9 12/21/2021  ? PLT 215 12/21/2021  ? ?  Chemistry   ?   ?Component Value Date/Time  ? NA 141 12/21/2021 1118  ? K 3.9 12/21/2021 1118  ? CL 109 12/21/2021 1118  ? CO2 27 12/21/2021 1118  ? BUN 46 (H) 12/21/2021 1118  ? CREATININE 1.77 (H) 12/21/2021 1118  ?    ?  Component Value Date/Time  ? CALCIUM 9.5 12/21/2021 1118  ? ALKPHOS 40 12/21/2021 1118  ? AST 24 12/21/2021 1118  ? ALT 10 12/21/2021 1118  ? BILITOT 0.5 12/21/2021 1118  ?  ? ? ? Latest Reference Range & Units 12/21/21 11:18  ?Glucose 70 - 99 mg/dL 93  ?Testosterone 264 - 916 ng/dL <3 (L)  ?Prostate Specific Ag, Serum 0.0 - 4.0 ng/mL 0.8  ?(L): Data is abnormally low ? ?Impression and Plan: ? ?76 year old with: ? ?1.  Advanced prostate cancer with disease to the bone and lymphadenopathy diagnosed in November 2022.  He has castration-sensitive disease.  His last PSA was 0.8 in January 2023 but rising over 3 at this time.  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 ?  ?The natural course  of this disease was reviewed at this time and therapy escalation options were reiterated.  These would include androgen receptor pathway inhibitors and systemic chemotherapy.  He is a marginal candidate for any additional treatment at this time. ? ?Given the fact that he is asymptomatic from his disease I recommended no urgency to add additional treatments but his wife understands that his cancer would likely progress and will possibly need additional treatment. ?  ?2.  Androgen deprivation therapy: He continues to receive that under the care of [facility]. ? ?3.  Hydronephrosis and urinary obstruction: He is under evaluation for possible suprapubic catheter versus nephrostomy tube.  He is following with Dr. Cleophus Molt regarding this issue. ?  ?  ?4.  Prognosis: His disease is incurable and any treatment is palliative.  This was discussed today in detail and overall life expectancy was reviewed.  Given the rapid rise in his PSA is likely we are dealing with a rather aggressive malignancy with limited life expectancy.  Given his other comorbid condition it is reasonable to tailor his treatment to treat symptoms related to the cancer rather than the cancer itself. ?  ?  ?5.  Follow-up: will be in the next 4 to 6 weeks to follow his progress. ? ? ? ?I discussed the assessment and treatment plan with the patient. The patient was provided an opportunity to ask questions and all were answered. The patient agreed with the plan and demonstrated an understanding of the instructions. ?  ?The patient was advised to call back or seek an in-person evaluation if the symptoms worsen or if the condition fails to improve as anticipated. ? ?I provided 20 minutes of non face-to-face telephone visit time during this encounter.  The time was dedicated to reviewing his disease status, treatment choices and answering questions regarding future plan of care. ? ?Zola Button, MD 03/07/2022 8:30 AM ? ?

## 2022-03-17 ENCOUNTER — Other Ambulatory Visit (HOSPITAL_COMMUNITY): Payer: Self-pay | Admitting: Urology

## 2022-03-17 ENCOUNTER — Other Ambulatory Visit: Payer: Self-pay | Admitting: Urology

## 2022-03-17 ENCOUNTER — Encounter: Payer: Self-pay | Admitting: *Deleted

## 2022-03-17 DIAGNOSIS — R339 Retention of urine, unspecified: Secondary | ICD-10-CM

## 2022-03-17 NOTE — Progress Notes (Unsigned)
Suttle, Rosanne Ashing, MD  Roosvelt Maser ?Approved for CT guided suprapubic catheter placement.  ? ?Dylan   ?  ?   ?Previous Messages ?  ?----- Message -----  ?From: Roosvelt Maser  ?Sent: 03/17/2022  12:01 PM EDT  ?To: Ir Procedure Requests  ?Subject: suprapubic tube placement                      ? ? ? ?Procedure:  suprapubic tube placement ( now sure what exam to order) ?  ? ?Reason: Urinary retention  ? ?History: CT done at [facility] urology  ? ?Provider:  Rexene Alberts  ?  [phone]   ?

## 2022-04-13 ENCOUNTER — Other Ambulatory Visit: Payer: Self-pay | Admitting: Student

## 2022-04-13 ENCOUNTER — Other Ambulatory Visit: Payer: Self-pay | Admitting: Radiology

## 2022-04-13 DIAGNOSIS — N138 Other obstructive and reflux uropathy: Secondary | ICD-10-CM

## 2022-04-13 NOTE — H&P (Signed)
Chief Complaint: Patient was seen in consultation today for suprapubic catheter placement  Referring Physician(s): Gay,Matthew R  Supervising Physician: Dr. Dwaine Gale  Patient Status: [facility] - Out-pt  History of Present Illness: Eric Krueger is a 76 y.o. 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 He has had a foley catheter in place since last August. The Urology team would like to discontinue the foley catheter and have a suprapubic catheter placed.   Interventional Radiology has been asked to evaluate this patient for an image-guided suprapubic catheter placement. Imaging reviewed and procedure approved by Dr. Serafina Royals.   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 Bjorn Loser   Foley catheter in place    History of gout    remote   History of pulmonary embolism 2011   per pt wife no previous blood clots prior to and none since 2011   Moderate tricuspid regurgitation    per last echo in care everywhere 05-18-2020  moderate to severe TR , RSVP 37-22mHg   [place]    approx late 2011 to 01/ 2012 dx AFib  s/p DCCV 01/ 2012 unsuccessful   Sigmoid diverticulosis    SSS (sick sinus syndrome) ([place] 01/2011   03/ 2012 s/p  permanent pacemaker  then upgraded to dual  ICD 05/ 2012 due to symptomatic VT   Urine retention 06/2021   has foley catheter   Ventricular tachycardia, 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   pt had recurrent syncopal episode, 03-29-2011 pacemaker intergatted in office baseline Afib and runs of NSVT which correlated with syncopal episodes  s/p removal pacemake and placement dual ICD    Past Surgical History:  Procedure Laterality Date   CARDIAC CATHETERIZATION  01/31/2011   '@[facility]'$ ;  no intracardiac shunting identified, mild volume expansion, normal pulmonary artery pressure, and adequate cardiac output   CARDIAC PACEMAKER PLACEMENT  01/2011   CARDIOVERSION  11/2010   unsuccessful   CATARACT EXTRACTION W/ INTRAOCULAR LENS IMPLANT Bilateral 2021   EP IMPLANTABLE DEVICE  04/04/2011   '@[facility]'$ ;  by dr mJerilynn Mages drucker---  EP study with inducible polymorphic VT,  DDD pacemaker removed and  Implanted Dual ICD device  ([employer] Jude)   ICD GENERATOR CHANGE  11/10/2015   '@[facility]'$    TONSILLECTOMY  1952   age 76  TRANSURETHRAL RESECTION OF PROSTATE N/A 09/17/2021   Procedure: TRANSURETHRAL RESECTION OF THE PROSTATE (TURP), BIPOLAR, BLADDER BIOPSY;  Surgeon: GJanith Lima MD;  [facility]  Service: Urology;  Laterality: N/A;    Allergies: Nsaids, Saw palmetto, Vytorin [ezetimibe-simvastatin], and Adhesive [tape]  Medications: Prior to Admission medications   Medication Sig Start Date End Date Taking? Authorizing Provider  acetaminophen (TYLENOL) 500 MG tablet Take 500 mg by mouth every 6 (six) hours as needed. 09/22/16  Yes [provider]  atorvastatin (LIPITOR) 10 MG tablet Take 10 mg by mouth at bedtime. 02/15/15  Yes [provider]  ELIQUIS 5 MG TABS tablet Take 1 tablet (5 mg total) by mouth 2 (two) times daily. 09/19/21  Yes Festus Aloe, MD  fenofibrate (TRICOR) 145 MG tablet Take 145 mg by mouth daily. 02/15/15  Yes [provider]  memantine (NAMENDA) 10 MG tablet Take 10 mg by mouth 2 (two) times daily.  2 tab daily for memory 06/02/21  Yes [provider]  mirtazapine (REMERON) 15 MG tablet Take 15 mg by mouth at bedtime. 03/21/21  Yes [provider]  sotalol (BETAPACE) 80 MG tablet Take 80 mg by mouth 2 (two) times daily. 03/28/15  Yes [provider]  tamsulosin (FLOMAX) 0.4 MG CAPS capsule Take 0.4 mg by mouth daily. 05/06/21  Yes [provider]     Family History  Problem Relation Age of Onset   Diabetes Mellitus II Other     Social History   Socioeconomic History   Marital status: Married    Spouse name: Not on file   Number of children: Not on file   Years of education: Not on file   Highest education level: Not on file  Occupational History   Not on file  Tobacco Use   Smoking status: Never   Smokeless tobacco: Never  Vaping Use   Vaping Use: Never used  Substance and Sexual Activity   Alcohol use: Not Currently   Drug use: Never   Sexual activity: Not on file  Other Topics Concern   Not on file  Social History Narrative   Not on file   Social Determinants of Health   Financial Resource Strain: Not on file  Food Insecurity: Not on file  Transportation Needs: Not on file  Physical Activity: Not on file  Stress: Not on file  Social Connections: Not on file    Review of Systems: A 12 point ROS discussed and pertinent positives are indicated in the HPI above.  All other systems are negative.  Review of Systems  Constitutional:  Positive for fatigue. Negative for appetite change.  Respiratory:  Negative for cough.   Cardiovascular:  Negative for chest pain and leg swelling.  Gastrointestinal:  Negative for abdominal pain, diarrhea, nausea and vomiting.  Genitourinary:  Positive for difficulty urinating.  Musculoskeletal:  Negative for back pain.  Neurological:  Negative for dizziness and headaches.   Vital Signs: BP 119/64   Pulse 78   Temp 97.8 F (36.6 C) (Oral)   Resp 19   Ht '6\' 3"'$  (1.905 m)   Wt 168 lb 12.8 oz (76.6  kg)   SpO2 96%   BMI 21.10 kg/m   Physical Exam Constitutional:      General: He is not in acute distress.    Appearance: He is underweight.  HENT:     Mouth/Throat:     Mouth: Mucous membranes are moist.     Pharynx: Oropharynx is clear.  Cardiovascular:     Rate and Rhythm: Normal rate and regular rhythm.     Pulses: Normal pulses.     Heart sounds: Normal heart sounds.     Comments: Left upper chest ICD in place.  Pulmonary:     Effort: Pulmonary effort is normal.     Breath sounds: Normal breath sounds.  Abdominal:     General: Bowel sounds are normal.     Palpations: Abdomen is soft.  Genitourinary:  Comments: Foley catheter. Dark yellow urine in bag.  Skin:    General: Skin is warm and dry.  Neurological:     Mental Status: He is alert and oriented to person, place, and time.    Imaging: No results found.  Labs:  CBC: Recent Labs    07/07/21 0217 07/08/21 0107 09/17/21 0817 09/18/21 0540 12/21/21 1118 04/14/22 0910  WBC 6.3 6.8  --   --  5.0 5.3  HGB 11.1* 12.0* 13.3 10.4* 12.2* 11.1*  HCT 34.1* 37.2* 39.0 32.6* 37.3* 34.3*  PLT 155 162  --   --  215 219    COAGS: Recent Labs    07/04/21 0205 04/14/22 0910  INR 3.7* 1.3*  APTT 43*  --     BMP: Recent Labs    07/07/21 0217 07/08/21 0107 09/17/21 0817 09/18/21 0540 12/21/21 1118  NA 146* 141 143 135 141  K 3.4* 3.6 4.2 3.9 3.9  CL 118* 113* 102 109 109  CO2 21* 22  --  22 27  GLUCOSE 106* 90 89 83 93  BUN 38* 27* 42* 38* 46*  CALCIUM 8.3* 8.5*  --  8.8* 9.5  CREATININE 1.81* 1.47* 1.50* 1.38* 1.77*  GFRNONAA 38* 49*  --  53* 39*    LIVER FUNCTION TESTS: Recent Labs    07/04/21 0205 07/05/21 0500 07/06/21 0347 12/21/21 1118  BILITOT 1.3* 0.9 0.6 0.5  AST 77* 95* 91* 24  ALT 80* 78* 75* 10  ALKPHOS 35* 27* 29* 40  PROT 6.2* 5.0* 5.2* 6.5  ALBUMIN 3.0* 2.3* 2.2* 3.9    TUMOR MARKERS: No results for input(s): AFPTM, CEA, CA199, CHROMGRNA in the last 8760  hours.  Assessment and Plan:  Prostate cancer; Urinary retention: Eric Krueger, 76 year old male, presents today to the [facility] department for an image-guided suprapubic catheter placement.   Risks and benefits discussed with the patient including bleeding, infection, damage to adjacent structures and sepsis.  All of the patient's questions were answered, patient is agreeable to proceed. He has been NPO. Last dose of Eliquis was Monday Apr 11, 2022  Consent signed and in chart.  Thank you for this interesting consult.  I greatly enjoyed meeting Eric Krueger and look forward to participating in their care.  A copy of this report was sent to the requesting provider on this date.  Electronically Signed: Soyla Dryer, AGACNP-BC [phone] 04/14/2022, 9:42 AM   I spent a total of  30 Minutes   in face to face in clinical consultation, greater than 50% of which was counseling/coordinating care for suprapubic catheter placement.

## 2022-04-14 ENCOUNTER — Ambulatory Visit (HOSPITAL_COMMUNITY)
Admission: RE | Admit: 2022-04-14 | Discharge: 2022-04-14 | Disposition: A | Discharge status: Home / Self Care | Payer: Medicare Other | Source: Ambulatory Visit | Attending: Urology | Admitting: Urology

## 2022-04-14 ENCOUNTER — Other Ambulatory Visit (HOSPITAL_COMMUNITY): Payer: Self-pay | Admitting: Urology

## 2022-04-14 ENCOUNTER — Other Ambulatory Visit: Payer: Self-pay

## 2022-04-14 VITALS — BP 119/64 | HR 78 | Temp 97.8°F | Resp 19 | Ht 75.0 in | Wt 168.8 lb

## 2022-04-14 DIAGNOSIS — R339 Retention of urine, unspecified: Secondary | ICD-10-CM

## 2022-04-14 DIAGNOSIS — N401 Enlarged prostate with lower urinary tract symptoms: Secondary | ICD-10-CM | POA: Insufficient documentation

## 2022-04-14 DIAGNOSIS — Z86711 Personal history of pulmonary embolism: Secondary | ICD-10-CM | POA: Diagnosis not present

## 2022-04-14 DIAGNOSIS — I4819 Other persistent atrial fibrillation: Secondary | ICD-10-CM | POA: Diagnosis not present

## 2022-04-14 DIAGNOSIS — N183 Chronic kidney disease, stage 3 unspecified: Secondary | ICD-10-CM | POA: Insufficient documentation

## 2022-04-14 DIAGNOSIS — R3914 Feeling of incomplete bladder emptying: Secondary | ICD-10-CM | POA: Diagnosis not present

## 2022-04-14 DIAGNOSIS — Z95810 Presence of automatic (implantable) cardiac defibrillator: Secondary | ICD-10-CM | POA: Insufficient documentation

## 2022-04-14 DIAGNOSIS — N138 Other obstructive and reflux uropathy: Secondary | ICD-10-CM

## 2022-04-14 LAB — PROTIME-INR
INR: 1.3 — ABNORMAL HIGH (ref 0.8–1.2)
Prothrombin Time: 16.4 seconds — ABNORMAL HIGH (ref 11.4–15.2)

## 2022-04-14 LAB — CBC
HCT: 34.3 % — ABNORMAL LOW (ref 39.0–52.0)
Hemoglobin: 11.1 g/dL — ABNORMAL LOW (ref 13.0–17.0)
MCH: 29.5 pg (ref 26.0–34.0)
MCHC: 32.4 g/dL (ref 30.0–36.0)
MCV: 91.2 fL (ref 80.0–100.0)
Platelets: 219 10*3/uL (ref 150–400)
RBC: 3.76 MIL/uL — ABNORMAL LOW (ref 4.22–5.81)
RDW: 15.1 % (ref 11.5–15.5)
WBC: 5.3 10*3/uL (ref 4.0–10.5)
nRBC: 0 % (ref 0.0–0.2)

## 2022-04-14 MED ORDER — MIDAZOLAM HCL 2 MG/2ML IJ SOLN
INTRAMUSCULAR | Status: AC
  Filled 2022-04-14: qty 2

## 2022-04-14 MED ORDER — SODIUM CHLORIDE 0.9 % IV SOLN: INTRAVENOUS | Status: DC

## 2022-04-14 MED ORDER — LIDOCAINE HCL 1 % IJ SOLN
INTRAMUSCULAR | Status: AC
  Filled 2022-04-14: qty 10

## 2022-04-14 MED ORDER — MIDAZOLAM HCL 2 MG/2ML IJ SOLN
INTRAMUSCULAR | Status: AC | PRN
  Administered 2022-04-14: .5 mg via INTRAVENOUS
  Administered 2022-04-14: 1 mg via INTRAVENOUS

## 2022-04-14 MED ORDER — FENTANYL CITRATE (PF) 100 MCG/2ML IJ SOLN
INTRAMUSCULAR | Status: AC
  Filled 2022-04-14: qty 2

## 2022-04-14 MED ORDER — FENTANYL CITRATE (PF) 100 MCG/2ML IJ SOLN
INTRAMUSCULAR | Status: AC | PRN
  Administered 2022-04-14 (×2): 25 ug via INTRAVENOUS

## 2022-04-14 NOTE — Progress Notes (Signed)
Discharge instructions given and reviewed with pt's family member and care giver. They verbalized understanding. IV removed. No s/s of distress at this time. Pt transported to main lobby via wheelchair accompanied by volunteer for discharge.

## 2022-04-14 NOTE — Procedures (Signed)
Interventional Radiology Procedure Note  Procedure: CT guided suprapubic catheter placement  Indication: Bladder outlet obstruction  Findings: Please refer to procedural dictation for full description.  Complications: None  EBL: < 10 mL  Miachel Roux, MD [phone]

## 2022-04-16 ENCOUNTER — Encounter (HOSPITAL_COMMUNITY): Payer: Self-pay

## 2022-04-16 ENCOUNTER — Emergency Department (HOSPITAL_COMMUNITY)
Admission: EM | Admit: 2022-04-16 | Discharge: 2022-04-16 | Disposition: A | Discharge status: Home / Self Care | Payer: Medicare Other | Attending: Emergency Medicine | Admitting: Emergency Medicine

## 2022-04-16 ENCOUNTER — Other Ambulatory Visit: Payer: Self-pay

## 2022-04-16 DIAGNOSIS — I4891 Unspecified atrial fibrillation: Secondary | ICD-10-CM | POA: Insufficient documentation

## 2022-04-16 DIAGNOSIS — Z7901 Long term (current) use of anticoagulants: Secondary | ICD-10-CM | POA: Diagnosis not present

## 2022-04-16 DIAGNOSIS — Z8546 Personal history of malignant neoplasm of prostate: Secondary | ICD-10-CM | POA: Insufficient documentation

## 2022-04-16 DIAGNOSIS — T83518A Infection and inflammatory reaction due to other urinary catheter, initial encounter: Secondary | ICD-10-CM | POA: Diagnosis present

## 2022-04-16 DIAGNOSIS — T83010A Breakdown (mechanical) of cystostomy catheter, initial encounter: Secondary | ICD-10-CM

## 2022-04-16 LAB — BASIC METABOLIC PANEL
Anion gap: 7 (ref 5–15)
BUN: 45 mg/dL — ABNORMAL HIGH (ref 8–23)
CO2: 25 mmol/L (ref 22–32)
Calcium: 9.7 mg/dL (ref 8.9–10.3)
Chloride: 109 mmol/L (ref 98–111)
Creatinine, Ser: 1.86 mg/dL — ABNORMAL HIGH (ref 0.61–1.24)
GFR, Estimated: 37 mL/min — ABNORMAL LOW (ref >60)
Glucose, Bld: 153 mg/dL — ABNORMAL HIGH (ref 70–99)
Potassium: 4 mmol/L (ref 3.5–5.1)
Sodium: 141 mmol/L (ref 135–145)

## 2022-04-16 LAB — CBC
HCT: 33.7 % — ABNORMAL LOW (ref 39.0–52.0)
Hemoglobin: 10.6 g/dL — ABNORMAL LOW (ref 13.0–17.0)
MCH: 28.7 pg (ref 26.0–34.0)
MCHC: 31.5 g/dL (ref 30.0–36.0)
MCV: 91.3 fL (ref 80.0–100.0)
Platelets: 224 10*3/uL (ref 150–400)
RBC: 3.69 MIL/uL — ABNORMAL LOW (ref 4.22–5.81)
RDW: 14.9 % (ref 11.5–15.5)
WBC: 6.2 10*3/uL (ref 4.0–10.5)
nRBC: 0 % (ref 0.0–0.2)

## 2022-04-16 NOTE — ED Triage Notes (Signed)
Pt coming from home via [facility]. Pt has a suprapubic catheter that was placed at [facility] Thursday. Pts wife noticed that his catheter was displaced and called EMS. Drainage bag is full of blood and blood is noted around the site. Pt denies any abdominal pain, nausea/vomiting. Pt has a history of prostate cancer and dementia.    BP 135/57 P 84 96% RA

## 2022-04-16 NOTE — Discharge Instructions (Signed)
No concerning findings on your blood cell counts or blood chemistry today.  I spoken with Dr. Junious Silk, who recommends not taking the Eliquis for an additional 2 days.  I agree with this as well.  This will hopefully allow the body to heal from the recent tube placement and allow the bleeding to slow.  Please follow-up with your doctors next week.   If the catheter does become clogged or blocked, you will need to return to the emergency department.

## 2022-04-16 NOTE — ED Notes (Signed)
Catheter irrigated by off going nurse. RN reported flushing without difficulty with bright red blood and blood clots removed.

## 2022-04-16 NOTE — ED Provider Notes (Signed)
[facility] EMERGENCY DEPARTMENT Provider Note   CSN: [id number] Arrival date & time: 04/16/22  1742     History  No chief complaint on file.   Eric Krueger is a 76 y.o. male.  Patient with history of metastatic prostate cancer, on anticoagulation for history of A-fib/PE -- presents to the emergency department today for evaluation of bleeding from his suprapubic catheter.  Patient has history of prostate cancer and bladder outlet obstruction.  He had an indwelling Foley catheter since August 2022.  Patient had a suprapubic catheter placed by interventional radiology 2 days ago.  He is on Eliquis and restarted this morning.  Yesterday normal urine noted in the bag.  This morning the urine was dark and then this afternoon the urine was grossly bloody.  Patient's wife called urology and they asked that he come to the emergency department for evaluation.  Patient has had some bladder spasms recently however this is improved with medication and less significant today.  Otherwise no significant pain.  No lightheadedness or syncope.  No shortness of breath.      Home Medications Prior to Admission medications   Medication Sig Start Date End Date Taking? Authorizing Provider  acetaminophen (TYLENOL) 500 MG tablet Take 500 mg by mouth every 6 (six) hours as needed. 09/22/16   [provider]  atorvastatin (LIPITOR) 10 MG tablet Take 10 mg by mouth at bedtime. 02/15/15   [provider]  ELIQUIS 5 MG TABS tablet Take 1 tablet (5 mg total) by mouth 2 (two) times daily. 09/19/21   Festus Aloe, MD  fenofibrate (TRICOR) 145 MG tablet Take 145 mg by mouth daily. 02/15/15   [provider]  memantine (NAMENDA) 10 MG tablet Take 10 mg by mouth 2 (two) times daily. 2 tab daily for memory 06/02/21   [provider]  mirtazapine (REMERON) 15 MG tablet Take 15 mg by mouth at bedtime. 03/21/21   [provider]  sotalol (BETAPACE) 80 MG tablet  Take 80 mg by mouth 2 (two) times daily. 03/28/15   [provider]  tamsulosin (FLOMAX) 0.4 MG CAPS capsule Take 0.4 mg by mouth daily. 05/06/21   [provider]      Allergies    Nsaids, Saw palmetto, Vytorin [ezetimibe-simvastatin], and Adhesive [tape]    Review of Systems   Review of Systems  Physical Exam Updated Vital Signs BP 105/72 (BP Location: Right Arm)   Pulse 79   Temp 97.7 F (36.5 C) (Oral)   Resp 18   Ht '6\' 3"'$  (1.905 m)   Wt 76.6 kg   SpO2 100%   BMI 21.11 kg/m   Physical Exam Vitals and nursing note reviewed.  Constitutional:      General: He is not in acute distress.    Appearance: He is well-developed.  HENT:     Head: Normocephalic and atraumatic.  Eyes:     General:        Right eye: No discharge.        Left eye: No discharge.     Conjunctiva/sclera: Conjunctivae normal.  Cardiovascular:     Rate and Rhythm: Normal rate and regular rhythm.     Heart sounds: Normal heart sounds.  Pulmonary:     Effort: Pulmonary effort is normal.     Breath sounds: Normal breath sounds.  Abdominal:     Palpations: Abdomen is soft.     Tenderness: There is no abdominal tenderness. There is no guarding or rebound.  Comments: Suprapubic cath in place, there is blood around the site of the catheter and tube is sutured to the skin. Mostly dark red blood in bag.   Musculoskeletal:     Cervical back: Normal range of motion and neck supple.  Skin:    General: Skin is warm and dry.  Neurological:     Mental Status: He is alert.    ED Results / Procedures / Treatments   Labs (all labs ordered are listed, but only abnormal results are displayed) Labs Reviewed  CBC - Abnormal; Notable for the following components:      Result Value   RBC 3.69 (*)    Hemoglobin 10.6 (*)    HCT 33.7 (*)    All other components within normal limits  BASIC METABOLIC PANEL - Abnormal; Notable for the following components:   Glucose, Bld 153 (*)    BUN 45 (*)     Creatinine, Ser 1.86 (*)    GFR, Estimated 37 (*)    All other components within normal limits    EKG None  Radiology No results found.  Procedures Procedures    Medications Ordered in ED Medications - No data to display  ED Course/ Medical Decision Making/ A&P    Patient seen and examined. History obtained directly from patient. Also reviewed recent interventional radiology procedure notes.  Labs/EKG: Ordered CBC, BMP  Imaging: None ordered  Medications/Fluids: None ordered most recent vital signs reviewed and are as follows: BP 105/72 (BP Location: Right Arm)   Pulse 79   Temp 97.7 F (36.5 C) (Oral)   Resp 18   Ht '6\' 3"'$  (1.905 m)   Wt 76.6 kg   SpO2 100%   BMI 21.11 kg/m   Initial impression: Recent suprapubic catheter placement, bleeding from and around catheter, patient on anticoagulation with Eliquis  Patient discussed with and seen by Dr. Wyvonnia Dusky.   7:20 PM Reassessment performed. Patient appears stable.  Nursing at bedside flushing the catheter.  Catheter flushes without resistance.  Clots removed from the tube.   Labs personally reviewed and interpreted including: CBC the with normal white blood cell count, hemoglobin 10.6 down only slightly from 2 days ago; BMP with creatinine of 1.86 near baseline with BUN of 45, glucose 153.  Reviewed pertinent lab work and imaging with patient at bedside. Questions answered.   I consulted with Dr. Junious Silk by telephone.  We discussed exam, ability to flush catheter.  He recommends holding anticoagulation for a couple more days.  As this is a puncture type of wound, bleeding is not unexpected, especially in setting of anticoagulation.  Otherwise, wound can be cleaned with water without any concerns.  Most current vital signs reviewed and are as follows: BP 124/60   Pulse 80   Temp 97.7 F (36.5 C) (Oral)   Resp 20   Ht '6\' 3"'$  (1.905 m)   Wt 76.6 kg   SpO2 100%   BMI 21.11 kg/m   Plan: Likely discharge  home.  I did discuss recommendations from Dr. Junious Silk.  This includes holding Eliquis for an additional 48 hours, cleaning around suprapubic tube site as needed, and monitoring for any signs of obstruction.  7:42 PM Patient rechecked by Dr. Wyvonnia Dusky. Will d/c.   Most current vital signs reviewed and are as follows: BP 124/60   Pulse 80   Temp 97.7 F (36.5 C) (Oral)   Resp 20   Ht '6\' 3"'$  (1.905 m)   Wt 76.6 kg   SpO2 100%  BMI 21.11 kg/m   Plan: Discharge to home.   Prescriptions written for: None  Other home care instructions discussed:   ED return instructions discussed: If tube becomes occluded, he has more severe pain, or severe bleeding despite stopping anticoagulation.  Follow-up instructions discussed: Patient encouraged to follow-up with their PCP and urologist in 3 days.                           Medical Decision Making Amount and/or Complexity of Data Reviewed Labs: ordered.   Patient presents for bleeding within and around the suprapubic catheter placed 2 days ago.  The initial concern was for displacement of the tube.  Tube appears well-placed and is flushing well and do not suspect displacement at this time.  Bleeding likely exacerbated by restarting anticoagulation today.  We will hold this for an additional couple of days.  I have obtained recommendations by telephone consult with urology as above.  Hemoglobin is near baseline.  Vital signs are stable.        Final Clinical Impression(s) / ED Diagnoses Final diagnoses:  Suprapubic catheter dysfunction, initial encounter [facility])    Rx / DC Orders ED Discharge Orders     None         Carlisle Cater, Hershal Coria 04/16/22 1946    Ezequiel Essex, MD 04/16/22 2247

## 2022-04-25 ENCOUNTER — Telehealth: Payer: Self-pay | Admitting: Oncology

## 2022-04-25 NOTE — Telephone Encounter (Signed)
Called patient regarding upcoming appointments, patient is notified. 

## 2022-05-06 ENCOUNTER — Inpatient Hospital Stay: Payer: Medicare Other | Attending: Oncology | Admitting: Oncology

## 2022-05-06 DIAGNOSIS — C61 Malignant neoplasm of prostate: Secondary | ICD-10-CM | POA: Diagnosis not present

## 2022-05-06 NOTE — Progress Notes (Signed)
Hematology and Oncology Follow Up for Telemedicine Visits  Eric Krueger [id number] [birth year] 76 y.o. 05/06/2022 2:50 PM Eric Krueger, MDBoals, Marjory Lies, MD   I connected with Eric Krueger on 05/06/22 at  3:45 PM EDT by telephone visit and verified that I am speaking with the correct person using two identifiers.   I discussed the limitations, risks, security and privacy concerns of performing an evaluation and management service by telemedicine and the availability of in-person appointments. I also discussed with the patient that there may be a patient responsible charge related to this service. The patient expressed understanding and agreed to proceed.  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   Current therapy:   Androgen deprivation therapy alone started in December 2022.    Interim History: [name] reports no changes in his health.  Most of the history was provided by his wife given his cognitive mental status.  He denies any pain or discomfort.  He has a suprapubic catheter in place without any active bleeding.   Medications: Updated on review Current Outpatient Medications  Medication Sig Dispense Refill   acetaminophen (TYLENOL) 500 MG tablet Take 500 mg by mouth every 6 (six) hours as needed.     atorvastatin (LIPITOR) 10 MG tablet Take 10 mg by mouth at bedtime.     ELIQUIS 5 MG TABS tablet Take 1 tablet (5 mg total) by mouth 2 (two) times daily. 60 tablet 11   fenofibrate (TRICOR) 145 MG tablet Take 145 mg by mouth daily.     memantine (NAMENDA) 10 MG tablet Take 10 mg by mouth 2 (two)  times daily. 2 tab daily for memory     mirtazapine (REMERON) 15 MG tablet Take 15 mg by mouth at bedtime.     sotalol (BETAPACE) 80 MG tablet Take 80 mg by mouth 2 (two) times daily.  0   tamsulosin (FLOMAX) 0.4 MG CAPS capsule Take 0.4 mg by mouth daily.     No current facility-administered medications for this visit.     Allergies:  Allergies  Allergen Reactions   Nsaids     Cannot take d/t defibrillator/pacemaker   Saw Palmetto Diarrhea   Vytorin [Ezetimibe-Simvastatin]     myalgias   Adhesive [Tape] Rash      Lab Results: Lab Results  Component Value Date   WBC 6.2 04/16/2022   HGB 10.6 (L) 04/16/2022   HCT 33.7 (L) 04/16/2022   MCV 91.3 04/16/2022   PLT 224 04/16/2022     Chemistry      Component Value Date/Time   NA 141 04/16/2022 1759   K 4.0 04/16/2022 1759   CL 109 04/16/2022 1759   CO2 25 04/16/2022 1759   BUN 45 (H) 04/16/2022 1759   CREATININE 1.86 (H) 04/16/2022 1759   CREATININE 1.77 (H) 12/21/2021 1118      Component Value Date/Time   CALCIUM 9.7 04/16/2022 1759   ALKPHOS 40 12/21/2021 1118   AST 24 12/21/2021 1118   ALT 10 12/21/2021 1118   BILITOT 0.5 12/21/2021 1118     PSA 12.3 in 03/2022  Impression and Plan:  76 year old with:  1.  Castration-sensitive advanced prostate cancer with disease to the bone and lymphadenopathy diagnosed in November 2022.  He is developing castration-resistant disease.    The natural course of this disease and treatment choices were discussed at this time.  Treatment choices for castration hyper resistant disease including androgen receptor pathway inhibitors among others were discussed.  Complications that include hypertension, fatigue among others were reviewed.  At this time, given his overall comorbidities and poor quality of life we have recommended to continue with active surveillance in addition to androgen deprivation therapy alone.  At this time, I feel that treatment will be worse than his disease  that is currently asymptomatic.  His wife agrees at this time we will continue to monitor.  2.  Androgen deprivation therapy: I recommended continuing this indefinitely.  3.  Hydronephrosis and urinary obstruction: Continue to be managed by urology at this time.  Suprapubic catheter remains in place.     4.  Prognosis: Treatment is palliative and his prognosis is poor given his overall comorbidities.     5.  Follow-up: In 3 months for repeat follow-up    I discussed the assessment and treatment plan with the patient. The patient was provided an opportunity to ask questions and all were answered. The patient agreed with the plan and demonstrated an understanding of the instructions.   The patient was advised to call back or seek an in-person evaluation if the symptoms worsen or if the condition fails to improve as anticipated.  I provided 20 minutes of non face-to-face telephone visit time during this encounter.  The time was spent on updating his disease status, reviewing laboratory data and outlining future plan of care discussion.  Zola Button, MD 05/06/2022 2:50 PM

## 2022-05-09 ENCOUNTER — Telehealth: Payer: Self-pay | Admitting: Oncology

## 2022-05-09 NOTE — Progress Notes (Signed)
RN left message for call back to inquire if patient would like to bet set up for genetic counseling/testing.

## 2022-05-09 NOTE — Telephone Encounter (Signed)
Called patient regarding upcoming August appointment, patient has been called and notified.  

## 2022-05-10 ENCOUNTER — Encounter (HOSPITAL_COMMUNITY): Payer: Self-pay | Admitting: Emergency Medicine

## 2022-05-10 ENCOUNTER — Inpatient Hospital Stay (HOSPITAL_COMMUNITY): Payer: Medicare Other | Admitting: Anesthesiology

## 2022-05-10 ENCOUNTER — Inpatient Hospital Stay (HOSPITAL_COMMUNITY)
Admission: EM | Admit: 2022-05-10 | Discharge: 2022-05-20 | DRG: 659 | Disposition: A | Discharge status: Home / Self Care | Payer: Medicare Other | Attending: Internal Medicine | Admitting: Internal Medicine

## 2022-05-10 ENCOUNTER — Emergency Department (HOSPITAL_COMMUNITY): Payer: Medicare Other

## 2022-05-10 ENCOUNTER — Inpatient Hospital Stay (HOSPITAL_COMMUNITY): Payer: Medicare Other

## 2022-05-10 ENCOUNTER — Other Ambulatory Visit: Payer: Self-pay

## 2022-05-10 ENCOUNTER — Encounter (HOSPITAL_COMMUNITY)
Admission: EM | Disposition: A | Discharge status: Home / Self Care | Payer: Self-pay | Source: Home / Self Care | Attending: Internal Medicine

## 2022-05-10 DIAGNOSIS — I248 Other forms of acute ischemic heart disease: Secondary | ICD-10-CM | POA: Diagnosis present

## 2022-05-10 DIAGNOSIS — I4891 Unspecified atrial fibrillation: Secondary | ICD-10-CM | POA: Diagnosis not present

## 2022-05-10 DIAGNOSIS — E870 Hyperosmolality and hypernatremia: Secondary | ICD-10-CM | POA: Diagnosis present

## 2022-05-10 DIAGNOSIS — I4819 Other persistent atrial fibrillation: Secondary | ICD-10-CM | POA: Diagnosis present

## 2022-05-10 DIAGNOSIS — N179 Acute kidney failure, unspecified: Secondary | ICD-10-CM

## 2022-05-10 DIAGNOSIS — T83090A Other mechanical complication of cystostomy catheter, initial encounter: Secondary | ICD-10-CM | POA: Diagnosis not present

## 2022-05-10 DIAGNOSIS — N1831 Chronic kidney disease, stage 3a: Secondary | ICD-10-CM | POA: Diagnosis present

## 2022-05-10 DIAGNOSIS — N139 Obstructive and reflux uropathy, unspecified: Secondary | ICD-10-CM

## 2022-05-10 DIAGNOSIS — N201 Calculus of ureter: Secondary | ICD-10-CM | POA: Diagnosis not present

## 2022-05-10 DIAGNOSIS — Z66 Do not resuscitate: Secondary | ICD-10-CM | POA: Diagnosis present

## 2022-05-10 DIAGNOSIS — E876 Hypokalemia: Secondary | ICD-10-CM | POA: Diagnosis not present

## 2022-05-10 DIAGNOSIS — N138 Other obstructive and reflux uropathy: Secondary | ICD-10-CM | POA: Diagnosis present

## 2022-05-10 DIAGNOSIS — I129 Hypertensive chronic kidney disease with stage 1 through stage 4 chronic kidney disease, or unspecified chronic kidney disease: Secondary | ICD-10-CM | POA: Diagnosis present

## 2022-05-10 DIAGNOSIS — I482 Chronic atrial fibrillation, unspecified: Secondary | ICD-10-CM | POA: Diagnosis present

## 2022-05-10 DIAGNOSIS — Z95810 Presence of automatic (implantable) cardiac defibrillator: Secondary | ICD-10-CM

## 2022-05-10 DIAGNOSIS — I442 Atrioventricular block, complete: Secondary | ICD-10-CM | POA: Diagnosis present

## 2022-05-10 DIAGNOSIS — Z961 Presence of intraocular lens: Secondary | ICD-10-CM | POA: Diagnosis present

## 2022-05-10 DIAGNOSIS — C7951 Secondary malignant neoplasm of bone: Secondary | ICD-10-CM | POA: Diagnosis present

## 2022-05-10 DIAGNOSIS — N390 Urinary tract infection, site not specified: Secondary | ICD-10-CM | POA: Diagnosis not present

## 2022-05-10 DIAGNOSIS — Z6821 Body mass index (BMI) 21.0-21.9, adult: Secondary | ICD-10-CM

## 2022-05-10 DIAGNOSIS — F0390 Unspecified dementia without behavioral disturbance: Secondary | ICD-10-CM

## 2022-05-10 DIAGNOSIS — R778 Other specified abnormalities of plasma proteins: Secondary | ICD-10-CM

## 2022-05-10 DIAGNOSIS — E440 Moderate protein-calorie malnutrition: Secondary | ICD-10-CM | POA: Diagnosis present

## 2022-05-10 DIAGNOSIS — R569 Unspecified convulsions: Secondary | ICD-10-CM

## 2022-05-10 DIAGNOSIS — R6521 Severe sepsis with septic shock: Secondary | ICD-10-CM | POA: Diagnosis present

## 2022-05-10 DIAGNOSIS — D638 Anemia in other chronic diseases classified elsewhere: Secondary | ICD-10-CM | POA: Diagnosis present

## 2022-05-10 DIAGNOSIS — N136 Pyonephrosis: Secondary | ICD-10-CM | POA: Diagnosis present

## 2022-05-10 DIAGNOSIS — R5381 Other malaise: Secondary | ICD-10-CM | POA: Diagnosis present

## 2022-05-10 DIAGNOSIS — J90 Pleural effusion, not elsewhere classified: Secondary | ICD-10-CM | POA: Diagnosis present

## 2022-05-10 DIAGNOSIS — N320 Bladder-neck obstruction: Secondary | ICD-10-CM | POA: Diagnosis present

## 2022-05-10 DIAGNOSIS — Z86711 Personal history of pulmonary embolism: Secondary | ICD-10-CM

## 2022-05-10 DIAGNOSIS — I495 Sick sinus syndrome: Secondary | ICD-10-CM | POA: Diagnosis present

## 2022-05-10 DIAGNOSIS — E2740 Unspecified adrenocortical insufficiency: Secondary | ICD-10-CM | POA: Diagnosis present

## 2022-05-10 DIAGNOSIS — Z1629 Resistance to other single specified antibiotic: Secondary | ICD-10-CM | POA: Diagnosis present

## 2022-05-10 DIAGNOSIS — G300 Alzheimer's disease with early onset: Secondary | ICD-10-CM | POA: Diagnosis present

## 2022-05-10 DIAGNOSIS — R627 Adult failure to thrive: Secondary | ICD-10-CM | POA: Diagnosis present

## 2022-05-10 DIAGNOSIS — R0609 Other forms of dyspnea: Secondary | ICD-10-CM | POA: Diagnosis not present

## 2022-05-10 DIAGNOSIS — C61 Malignant neoplasm of prostate: Secondary | ICD-10-CM | POA: Diagnosis present

## 2022-05-10 DIAGNOSIS — Z886 Allergy status to analgesic agent status: Secondary | ICD-10-CM

## 2022-05-10 DIAGNOSIS — E785 Hyperlipidemia, unspecified: Secondary | ICD-10-CM | POA: Diagnosis present

## 2022-05-10 DIAGNOSIS — B964 Proteus (mirabilis) (morganii) as the cause of diseases classified elsewhere: Secondary | ICD-10-CM | POA: Diagnosis present

## 2022-05-10 DIAGNOSIS — Z7901 Long term (current) use of anticoagulants: Secondary | ICD-10-CM

## 2022-05-10 DIAGNOSIS — Y738 Miscellaneous gastroenterology and urology devices associated with adverse incidents, not elsewhere classified: Secondary | ICD-10-CM | POA: Diagnosis present

## 2022-05-10 DIAGNOSIS — Z833 Family history of diabetes mellitus: Secondary | ICD-10-CM

## 2022-05-10 DIAGNOSIS — F0280 Dementia in other diseases classified elsewhere without behavioral disturbance: Secondary | ICD-10-CM | POA: Diagnosis present

## 2022-05-10 DIAGNOSIS — I82622 Acute embolism and thrombosis of deep veins of left upper extremity: Secondary | ICD-10-CM | POA: Diagnosis not present

## 2022-05-10 DIAGNOSIS — A419 Sepsis, unspecified organism: Secondary | ICD-10-CM | POA: Diagnosis present

## 2022-05-10 DIAGNOSIS — K5900 Constipation, unspecified: Secondary | ICD-10-CM

## 2022-05-10 DIAGNOSIS — E878 Other disorders of electrolyte and fluid balance, not elsewhere classified: Secondary | ICD-10-CM | POA: Diagnosis not present

## 2022-05-10 DIAGNOSIS — A499 Bacterial infection, unspecified: Secondary | ICD-10-CM | POA: Diagnosis present

## 2022-05-10 DIAGNOSIS — G3184 Mild cognitive impairment, so stated: Secondary | ICD-10-CM | POA: Diagnosis not present

## 2022-05-10 DIAGNOSIS — Z888 Allergy status to other drugs, medicaments and biological substances status: Secondary | ICD-10-CM

## 2022-05-10 DIAGNOSIS — Z9079 Acquired absence of other genital organ(s): Secondary | ICD-10-CM

## 2022-05-10 DIAGNOSIS — R310 Gross hematuria: Secondary | ICD-10-CM | POA: Diagnosis not present

## 2022-05-10 DIAGNOSIS — D649 Anemia, unspecified: Secondary | ICD-10-CM

## 2022-05-10 DIAGNOSIS — R6520 Severe sepsis without septic shock: Secondary | ICD-10-CM | POA: Diagnosis not present

## 2022-05-10 DIAGNOSIS — Z91048 Other nonmedicinal substance allergy status: Secondary | ICD-10-CM

## 2022-05-10 DIAGNOSIS — R609 Edema, unspecified: Secondary | ICD-10-CM | POA: Diagnosis not present

## 2022-05-10 DIAGNOSIS — C7800 Secondary malignant neoplasm of unspecified lung: Secondary | ICD-10-CM | POA: Diagnosis present

## 2022-05-10 DIAGNOSIS — R591 Generalized enlarged lymph nodes: Secondary | ICD-10-CM | POA: Diagnosis present

## 2022-05-10 DIAGNOSIS — Z20822 Contact with and (suspected) exposure to covid-19: Secondary | ICD-10-CM | POA: Diagnosis present

## 2022-05-10 DIAGNOSIS — N401 Enlarged prostate with lower urinary tract symptoms: Secondary | ICD-10-CM | POA: Diagnosis present

## 2022-05-10 DIAGNOSIS — Z79899 Other long term (current) drug therapy: Secondary | ICD-10-CM

## 2022-05-10 DIAGNOSIS — G9340 Encephalopathy, unspecified: Secondary | ICD-10-CM | POA: Diagnosis not present

## 2022-05-10 DIAGNOSIS — K4020 Bilateral inguinal hernia, without obstruction or gangrene, not specified as recurrent: Secondary | ICD-10-CM | POA: Diagnosis present

## 2022-05-10 HISTORY — DX: Acute kidney failure, unspecified: N17.9

## 2022-05-10 HISTORY — DX: Sepsis, unspecified organism: A41.9

## 2022-05-10 LAB — RESP PANEL BY RT-PCR (FLU A&B, COVID) ARPGX2
Influenza A by PCR: NEGATIVE
Influenza B by PCR: NEGATIVE
SARS Coronavirus 2 by RT PCR: NEGATIVE

## 2022-05-10 LAB — COMPREHENSIVE METABOLIC PANEL
ALT: 15 U/L (ref 0–44)
AST: 36 U/L (ref 15–41)
Albumin: 3.3 g/dL — ABNORMAL LOW (ref 3.5–5.0)
Alkaline Phosphatase: 59 U/L (ref 38–126)
Anion gap: 11 (ref 5–15)
BUN: 56 mg/dL — ABNORMAL HIGH (ref 8–23)
CO2: 22 mmol/L (ref 22–32)
Calcium: 10.2 mg/dL (ref 8.9–10.3)
Chloride: 109 mmol/L (ref 98–111)
Creatinine, Ser: 2.39 mg/dL — ABNORMAL HIGH (ref 0.61–1.24)
GFR, Estimated: 27 mL/min — ABNORMAL LOW (ref >60)
Glucose, Bld: 105 mg/dL — ABNORMAL HIGH (ref 70–99)
Potassium: 4 mmol/L (ref 3.5–5.1)
Sodium: 142 mmol/L (ref 135–145)
Total Bilirubin: 1.2 mg/dL (ref 0.3–1.2)
Total Protein: 6.9 g/dL (ref 6.5–8.1)

## 2022-05-10 LAB — CBC WITH DIFFERENTIAL/PLATELET
Abs Immature Granulocytes: 0.04 10*3/uL (ref 0.00–0.07)
Basophils Absolute: 0 10*3/uL (ref 0.0–0.1)
Basophils Relative: 0 %
Eosinophils Absolute: 0.1 10*3/uL (ref 0.0–0.5)
Eosinophils Relative: 1 %
HCT: 31.1 % — ABNORMAL LOW (ref 39.0–52.0)
Hemoglobin: 9.8 g/dL — ABNORMAL LOW (ref 13.0–17.0)
Immature Granulocytes: 0 %
Lymphocytes Relative: 8 %
Lymphs Abs: 0.7 10*3/uL (ref 0.7–4.0)
MCH: 29 pg (ref 26.0–34.0)
MCHC: 31.5 g/dL (ref 30.0–36.0)
MCV: 92 fL (ref 80.0–100.0)
Monocytes Absolute: 0.7 10*3/uL (ref 0.1–1.0)
Monocytes Relative: 7 %
Neutro Abs: 7.7 10*3/uL (ref 1.7–7.7)
Neutrophils Relative %: 84 %
Platelets: 325 10*3/uL (ref 150–400)
RBC: 3.38 MIL/uL — ABNORMAL LOW (ref 4.22–5.81)
RDW: 15.2 % (ref 11.5–15.5)
WBC: 9.2 10*3/uL (ref 4.0–10.5)
nRBC: 0 % (ref 0.0–0.2)

## 2022-05-10 LAB — URINALYSIS, ROUTINE W REFLEX MICROSCOPIC
Bilirubin Urine: NEGATIVE
Glucose, UA: NEGATIVE mg/dL
Ketones, ur: NEGATIVE mg/dL
Nitrite: NEGATIVE
Protein, ur: 100 mg/dL — AB
RBC / HPF: >50 RBC/hpf — ABNORMAL HIGH (ref 0–5)
Specific Gravity, Urine: 1.014 (ref 1.005–1.030)
WBC, UA: >50 WBC/hpf — ABNORMAL HIGH (ref 0–5)
pH: 7 (ref 5.0–8.0)

## 2022-05-10 LAB — LACTIC ACID, PLASMA
Lactic Acid, Venous: 1.1 mmol/L (ref 0.5–1.9)
Lactic Acid, Venous: 1.8 mmol/L (ref 0.5–1.9)

## 2022-05-10 LAB — APTT: aPTT: 44 seconds — ABNORMAL HIGH (ref 24–36)

## 2022-05-10 LAB — TROPONIN I (HIGH SENSITIVITY): Troponin I (High Sensitivity): 88 ng/L — ABNORMAL HIGH (ref <18)

## 2022-05-10 LAB — PROTIME-INR
INR: 2.3 — ABNORMAL HIGH (ref 0.8–1.2)
Prothrombin Time: 25.2 seconds — ABNORMAL HIGH (ref 11.4–15.2)

## 2022-05-10 SURGERY — CYSTOSCOPY, WITH RETROGRADE PYELOGRAM AND URETERAL STENT INSERTION
Anesthesia: General | Site: Ureter | Laterality: Bilateral

## 2022-05-10 MED ORDER — ACETAMINOPHEN 10 MG/ML IV SOLN
INTRAVENOUS | Status: AC
  Filled 2022-05-10: qty 100

## 2022-05-10 MED ORDER — LACTATED RINGERS IV BOLUS
1000.0000 mL | Freq: Once | INTRAVENOUS | Status: AC
  Administered 2022-05-10: 1000 mL via INTRAVENOUS

## 2022-05-10 MED ORDER — ONDANSETRON HCL 4 MG/2ML IJ SOLN: 4.0000 mg | Freq: Once | INTRAMUSCULAR | Status: DC | PRN

## 2022-05-10 MED ORDER — PHENYLEPHRINE HCL (PRESSORS) 10 MG/ML IV SOLN
INTRAVENOUS | Status: AC
  Filled 2022-05-10: qty 1

## 2022-05-10 MED ORDER — PHENYLEPHRINE 80 MCG/ML (10ML) SYRINGE FOR IV PUSH (FOR BLOOD PRESSURE SUPPORT)
PREFILLED_SYRINGE | INTRAVENOUS | Status: DC | PRN
  Administered 2022-05-10 (×2): 80 ug via INTRAVENOUS
  Administered 2022-05-10: 160 ug via INTRAVENOUS

## 2022-05-10 MED ORDER — SODIUM CHLORIDE 0.9 % IR SOLN
Status: DC | PRN
  Administered 2022-05-10: 3000 mL

## 2022-05-10 MED ORDER — EPHEDRINE SULFATE-NACL 50-0.9 MG/10ML-% IV SOSY
PREFILLED_SYRINGE | INTRAVENOUS | Status: DC | PRN
  Administered 2022-05-10: 5 mg via INTRAVENOUS

## 2022-05-10 MED ORDER — IOHEXOL 300 MG/ML  SOLN
INTRAMUSCULAR | Status: DC | PRN
  Administered 2022-05-10: 20 mL via URETHRAL

## 2022-05-10 MED ORDER — ACETAMINOPHEN 10 MG/ML IV SOLN
INTRAVENOUS | Status: DC | PRN
  Administered 2022-05-10: 1000 mg via INTRAVENOUS

## 2022-05-10 MED ORDER — ONDANSETRON HCL 4 MG/2ML IJ SOLN
INTRAMUSCULAR | Status: DC | PRN
  Administered 2022-05-10: 4 mg via INTRAVENOUS

## 2022-05-10 MED ORDER — LIDOCAINE 2% (20 MG/ML) 5 ML SYRINGE
INTRAMUSCULAR | Status: DC | PRN
  Administered 2022-05-10: 80 mg via INTRAVENOUS

## 2022-05-10 MED ORDER — ALBUMIN HUMAN 5 % IV SOLN
INTRAVENOUS | Status: AC
  Filled 2022-05-10: qty 250

## 2022-05-10 MED ORDER — VANCOMYCIN HCL IN DEXTROSE 1-5 GM/200ML-% IV SOLN
1000.0000 mg | Freq: Once | INTRAVENOUS | Status: AC
  Administered 2022-05-10: 1000 mg via INTRAVENOUS
  Filled 2022-05-10: qty 200

## 2022-05-10 MED ORDER — PHENYLEPHRINE HCL-NACL 20-0.9 MG/250ML-% IV SOLN
INTRAVENOUS | Status: DC | PRN
  Administered 2022-05-10: 40 ug/min via INTRAVENOUS

## 2022-05-10 MED ORDER — METRONIDAZOLE 500 MG/100ML IV SOLN
500.0000 mg | Freq: Once | INTRAVENOUS | Status: AC
  Administered 2022-05-10: 500 mg via INTRAVENOUS
  Filled 2022-05-10: qty 100

## 2022-05-10 MED ORDER — LIDOCAINE HCL (PF) 2 % IJ SOLN
INTRAMUSCULAR | Status: AC
  Filled 2022-05-10: qty 5

## 2022-05-10 MED ORDER — PROPOFOL 10 MG/ML IV BOLUS
INTRAVENOUS | Status: AC
  Filled 2022-05-10: qty 20

## 2022-05-10 MED ORDER — FENTANYL CITRATE (PF) 100 MCG/2ML IJ SOLN
INTRAMUSCULAR | Status: AC
  Filled 2022-05-10: qty 2

## 2022-05-10 MED ORDER — ONDANSETRON HCL 4 MG/2ML IJ SOLN
INTRAMUSCULAR | Status: AC
  Filled 2022-05-10: qty 2

## 2022-05-10 MED ORDER — SODIUM CHLORIDE 0.9 % IV SOLN
2.0000 g | Freq: Once | INTRAVENOUS | Status: AC
  Administered 2022-05-10: 2 g via INTRAVENOUS
  Filled 2022-05-10: qty 12.5

## 2022-05-10 MED ORDER — DEXAMETHASONE SODIUM PHOSPHATE 10 MG/ML IJ SOLN
INTRAMUSCULAR | Status: DC | PRN
  Administered 2022-05-10: 8 mg via INTRAVENOUS

## 2022-05-10 MED ORDER — FENTANYL CITRATE PF 50 MCG/ML IJ SOSY: 25.0000 ug | PREFILLED_SYRINGE | INTRAMUSCULAR | Status: DC | PRN

## 2022-05-10 MED ORDER — PROPOFOL 10 MG/ML IV BOLUS
INTRAVENOUS | Status: DC | PRN
  Administered 2022-05-10: 100 mg via INTRAVENOUS

## 2022-05-10 MED ORDER — ORAL CARE MOUTH RINSE: 15.0000 mL | OROMUCOSAL | Status: DC | PRN

## 2022-05-10 MED ORDER — DEXAMETHASONE SODIUM PHOSPHATE 10 MG/ML IJ SOLN
INTRAMUSCULAR | Status: AC
  Filled 2022-05-10: qty 1

## 2022-05-10 MED ORDER — LACTATED RINGERS IV BOLUS (SEPSIS)
1000.0000 mL | Freq: Once | INTRAVENOUS | Status: AC
  Administered 2022-05-10: 1000 mL via INTRAVENOUS

## 2022-05-10 MED ORDER — FENTANYL CITRATE (PF) 100 MCG/2ML IJ SOLN
INTRAMUSCULAR | Status: DC | PRN
Start: 2022-05-10 — End: 2022-05-10
  Administered 2022-05-10 (×4): 25 ug via INTRAVENOUS

## 2022-05-10 MED ORDER — LACTATED RINGERS IV SOLN: INTRAVENOUS | Status: DC

## 2022-05-10 MED ORDER — CHLORHEXIDINE GLUCONATE CLOTH 2 % EX PADS
6.0000 | MEDICATED_PAD | Freq: Every day | CUTANEOUS | Status: DC
  Administered 2022-05-11 – 2022-05-19 (×10): 6 via TOPICAL

## 2022-05-10 MED ORDER — ALBUMIN HUMAN 5 % IV SOLN
12.5000 g | Freq: Once | INTRAVENOUS | Status: AC
  Administered 2022-05-10: 12.5 g via INTRAVENOUS

## 2022-05-10 SURGICAL SUPPLY — 16 items
BAG URINE DRAIN 2000ML AR STRL (UROLOGICAL SUPPLIES) ×1 IMPLANT
BAG URO CATCHER STRL LF (MISCELLANEOUS) ×2 IMPLANT
CATH FOLEY 2WAY SLVR  5CC 20FR (CATHETERS) ×2
CATH FOLEY 2WAY SLVR 5CC 20FR (CATHETERS) IMPLANT
CATH URETL OPEN END 6FR 70 (CATHETERS) ×2 IMPLANT
CLOTH BEACON ORANGE TIMEOUT ST (SAFETY) ×2 IMPLANT
GLOVE BIO SURGEON STRL SZ7.5 (GLOVE) ×2 IMPLANT
GOWN STRL REUS W/ TWL XL LVL3 (GOWN DISPOSABLE) ×1 IMPLANT
GOWN STRL REUS W/TWL XL LVL3 (GOWN DISPOSABLE) ×1
GUIDEWIRE ANG ZIPWIRE 035X150 (WIRE) ×1 IMPLANT
GUIDEWIRE STR DUAL SENSOR (WIRE) ×2 IMPLANT
MANIFOLD NEPTUNE II (INSTRUMENTS) ×2 IMPLANT
PACK CYSTO (CUSTOM PROCEDURE TRAY) ×2 IMPLANT
STENT URET 6FRX26 CONTOUR (STENTS) ×2 IMPLANT
TUBING CONNECTING 10 (TUBING) ×2 IMPLANT
TUBING UROLOGY SET (TUBING) IMPLANT

## 2022-05-10 NOTE — Transfer of Care (Signed)
Immediate Anesthesia Transfer of Care Note  Patient: Eric Krueger  Procedure(s) Performed: CYSTOSCOPY WITH RETROGRADE PYELOGRAM/URETERAL STENT PLACEMENT (Bilateral: Ureter)  Patient Location: PACU  Anesthesia Type:General  Level of Consciousness: drowsy and patient cooperative  Airway & Oxygen Therapy: Patient Spontanous Breathing and Patient connected to face mask oxygen  Post-op Assessment: Report given to RN and Post -op Vital signs reviewed and stable  Post vital signs: Reviewed and stable  Last Vitals:  Vitals Value Taken Time  BP 92/53 05/10/22 2055  Temp    Pulse 80 05/10/22 2057  Resp 17 05/10/22 2057  SpO2 100 % 05/10/22 2057  Vitals shown include unvalidated device data.  Last Pain:  Vitals:   05/10/22 1756  TempSrc: Oral         Complications: No notable events documented.

## 2022-05-10 NOTE — Sepsis Progress Note (Signed)
eLink monitoring code sepsis.  

## 2022-05-10 NOTE — Anesthesia Postprocedure Evaluation (Signed)
Anesthesia Post Note  Patient: RAESHAWN VO  Procedure(s) Performed: CYSTOSCOPY WITH RETROGRADE PYELOGRAM/URETERAL STENT PLACEMENT (Bilateral: Ureter)     Patient location during evaluation: PACU Anesthesia Type: General Level of consciousness: awake and alert Pain management: pain level controlled Vital Signs Assessment: post-procedure vital signs reviewed and stable Respiratory status: spontaneous breathing, nonlabored ventilation, respiratory function stable and patient connected to nasal cannula oxygen Cardiovascular status: blood pressure returned to baseline and stable Postop Assessment: no apparent nausea or vomiting Anesthetic complications: no   No notable events documented.  Last Vitals:  Vitals:   05/10/22 2100 05/10/22 2104  BP: (!) 85/49 (!) 91/57  Pulse: 80 80  Resp: 17 (!) 22  Temp:    SpO2: 100% 95%    Last Pain:  Vitals:   05/10/22 1756  TempSrc: Oral                 Santa Lighter

## 2022-05-10 NOTE — ED Triage Notes (Signed)
Patient presents from [facility] due to AMS and dark cloudy urine. The patient removed his suprapubic cath. MD placed foley today. There is concern that urine may have spilled when the catheter was removed. EMS also noted a fever of 105 which decreased to 99 post tylenol.        HX: alzheimer's, pacemaker   EMS vitals: 118/82 BP 104 HR 18 RR 126 CBG 23 ET 72% SPO2 on 2L O2

## 2022-05-10 NOTE — Assessment & Plan Note (Deleted)
Urosepsis -has chronic outlet obstruction and hx of  left hydronephrosis.  Now with new right hydronephrosis and hydroureter with 2 obstructing calculi seen on CT A/P.  Urology Dr. Gloriann Loan consulted and will take him forPlan for cystoscopy with bilateral retrograde pyelogram, bilateral ureteral stent placement -has received IV vancomycin, cefepime and Flagy. Will continue Cefpime pending urine culture.

## 2022-05-10 NOTE — Anesthesia Procedure Notes (Signed)
Procedure Name: LMA Insertion Date/Time: 05/10/2022 7:38 PM  Performed by: Montel Clock, CRNAPre-anesthesia Checklist: Patient identified, Emergency Drugs available, Suction available, Patient being monitored and Timeout performed Patient Re-evaluated:Patient Re-evaluated prior to induction Oxygen Delivery Method: Circle system utilized Preoxygenation: Pre-oxygenation with 100% oxygen Induction Type: IV induction Ventilation: Mask ventilation without difficulty LMA: LMA with gastric port inserted LMA Size: 5.0 Number of attempts: 1 Dental Injury: Teeth and Oropharynx as per pre-operative assessment

## 2022-05-10 NOTE — Assessment & Plan Note (Signed)
-  creatinine of 2.39 up from 1.86 secondary to bilateral hydronephrosis -follow creatinine after stenting

## 2022-05-10 NOTE — H&P (Signed)
History and Physical    Patient: Eric Krueger FAO:[id number] DOB: 1946-02-15 DOA: 05/10/2022 DOS: the patient was seen and examined on 05/11/2022 PCP: Joneen Boers, MD  Patient coming from: Home  Chief Complaint:  Chief Complaint  Patient presents with   Altered Mental Status   Weakness   HPI: Eric Krueger is a 76 y.o. 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   Patient has chronic urinary retention requiring catheter since 06/2021 followed by urology. Ultimately had suprapubic catheter placed on 04/14/2022 by [facility].  Reportedly catheter was pulled out sometime in the past week.  [facility] and noted to have AMS and fever. They were unable to replace the suprapubic catheter and sent him here with urethral catheter.   He presented with fever of 102.5, mild tachypnea and otherwise normotensive without leukocytosis.  Has AKI of 2.39 and positive UA with large leukocyte, negative nitrite and many bacteria.  CT abdomen pelvis today showed bilateral severe hydronephrosis.  Hydronephrosis and hydroureter on the right is new with 11m calculus at the right UPJ and 731min proximal ureter.  There is 4 mm calculus at the left UVJ. ED physician discussed with urology Dr. BeGloriann Loanho will take patient for stenting tonight.  He has received IV vancomycin, cefepime and Flagyl in the ED.  Hospitalist consulted to admit.   review of Systems: unable to review all systems due to the inability of the patient to answer questions. Past Medical History:  Diagnosis Date   Anticoagulant long-term use    eliquis--- managed by cardiology   BPH with urinary obstruction    urologist-- dr gay   CHB (complete heart  block) ([facility]   CKD (chronic kidney disease), stage III ([facility]   Dual implantable cardioverter-defibrillator in situ 03/2011   followed by cardiologist-- dr swRowland Lathe   pt had DDD pacemaker and [facility]  Dual ICD 05/ 2012 for symptomatic polymorphic VT;  generator change 12/ 2016   Dyslipidemia    [facility]   neurologist-- dr d. meBjorn Loser Foley catheter in place    History of gout    remote   History of pulmonary embolism 2011   per pt wife no previous blood clots prior to and none since 2011   Moderate tricuspid regurgitation    per last echo in care everywhere 05-18-2020  moderate to severe TR , RSVP 37-4987m   Persistent atrial fibrillation ([facility]  approx late 2011 to 01/ 2012 dx AFib  s/p DCCV 01/ 2012 unsuccessful   Sigmoid diverticulosis    SSS (sick sinus syndrome) ([facility]CC[facility]3/2012   03/ 2012 s/p  permanent pacemaker  then upgraded to dual ICD 05/ 2012 due to symptomatic VT   Urine retention 06/2021   has foley catheter   Ventricular tachycardia, 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[place]CC[place]5/2012   pt had recurrent syncopal episode, 03-29-2011 pacemaker intergatted in office baseline Afib and runs of NSVT which correlated with syncopal episodes  s/p removal pacemake and placement dual ICD   Past Surgical History:  Procedure Laterality Date   CARDIAC CATHETERIZATION  01/31/2011   '@[facility]'$ ;  no intracardiac shunting identified, mild volume expansion, normal pulmonary artery pressure, and adequate cardiac output  CARDIAC PACEMAKER PLACEMENT  01/2011   CARDIOVERSION  11/2010   unsuccessful   CATARACT EXTRACTION W/ INTRAOCULAR LENS IMPLANT Bilateral 2021   EP IMPLANTABLE DEVICE  04/04/2011   '@[facility]'$ ;  by dr m. drucker---  EP study with inducible polymorphic VT,  DDD pacemaker removed and  Implanted Dual ICD device  (St Jude)   ICD GENERATOR CHANGE  11/10/2015   '@[facility]'$    TONSILLECTOMY  1950   age 64   TRANSURETHRAL RESECTION OF PROSTATE  N/A 09/17/2021   Procedure: TRANSURETHRAL RESECTION OF THE PROSTATE (TURP), BIPOLAR, BLADDER BIOPSY;  Surgeon: Janith Lima, MD;  Location: [facility];  Service: Urology;  Laterality: N/A;   Social History:  reports that he has never smoked. He has never used smokeless tobacco. He reports that he does not currently use alcohol. He reports that he does not use drugs.  Allergies  Allergen Reactions   Nsaids     Cannot take d/t defibrillator/pacemaker   Saw Palmetto Diarrhea   Vytorin [Ezetimibe-Simvastatin]     myalgias   Adhesive [Tape] Rash    Family History  Problem Relation Age of Onset   Diabetes Mellitus II Other     Prior to Admission medications   Medication Sig Start Date End Date Taking? Authorizing Provider  acetaminophen (TYLENOL) 500 MG tablet Take 500 mg by mouth every 6 (six) hours as needed. 09/22/16   [provider]  atorvastatin (LIPITOR) 10 MG tablet Take 10 mg by mouth at bedtime. 02/15/15   [provider]  ELIQUIS 5 MG TABS tablet Take 1 tablet (5 mg total) by mouth 2 (two) times daily. 09/19/21   Festus Aloe, MD  fenofibrate (TRICOR) 145 MG tablet Take 145 mg by mouth daily. 02/15/15   [provider]  memantine (NAMENDA) 10 MG tablet Take 10 mg by mouth 2 (two) times daily. 2 tab daily for memory 06/02/21   [provider]  mirtazapine (REMERON) 15 MG tablet Take 15 mg by mouth at bedtime. 03/21/21   [provider]  sotalol (BETAPACE) 80 MG tablet Take 80 mg by mouth 2 (two) times daily. 03/28/15   [provider]  tamsulosin (FLOMAX) 0.4 MG CAPS capsule Take 0.4 mg by mouth daily. 05/06/21   [provider]    Physical Exam: Vitals:   05/11/22 0105 05/11/22 0120 05/11/22 0130 05/11/22 0204  BP: (!) 120/58 (!) 113/46 (!) 98/40   Pulse: 63 78 69   Resp: '17 19 18   '$ Temp:  (!) 97.3 F (36.3 C)    TempSrc:  Oral    SpO2: 100% 100% 100%   Weight:    78 kg   Constitutional: NAD,  calm, ill appearing lethargic male sleeping with mouth wide open in bed. Awoke briefly to voice and denied any pain or discomfort. Drift back to sleep immediately after.  Eyes: lids and conjunctivae normal ENMT: Mucous membranes are moist.  Neck: normal, supple Respiratory: clear to auscultation bilaterally, no wheezing, no crackles. Normal respiratory effort. No accessory muscle use.  Cardiovascular: Regular rate and rhythm, no murmurs / rubs / gallops. No extremity edema.  Abdomen: no grimace with palpation of abdominal, no rebound tenderness, guarding or rigidity, no masses palpated. Bowel sounds positive.  Musculoskeletal: no clubbing / cyanosis. No joint deformity upper and lower extremities. Good ROM, no contractures. Normal muscle tone.  Skin: no rashes, lesions, ulcers. No induration Neurologic: lethargic and awoke briefly. Able to answer questions. Moves all extremities.  Psychiatric: unable to assess  with lethargy  Data Reviewed:  See HPI  Assessment and Plan: * Septic shock ([place]) Secondary to Urosepsis -has chronic outlet obstruction and hx of  left hydronephrosis.  Now with new right hydronephrosis and hydroureter with 2 obstructing calculi seen on CT A/P.   -Urology Dr. Gloriann Loan consulted took emergently for cystoscopy with bilateral retrograde pyelogram, bilateral ureteral stent placement. Intra-operatively he developed hypotension requiring Neo. Received a total of 3L IV NS bolus but remained hypotension with SBP in 90s with map around 55-60s. Will start on peripheral IV Levophed. If unable to wean by morning will need PCCM consult for central access.  -has received IV vancomycin, cefepime and Flagy. Will continue broad spectrum pending urine culture.   Elevated troponin 88 on presented and has upward trended to 120. Suspect demand due to septic shock and hypotension. -continue to trend and monitor symptoms.  Constipation Will need bowel regimen once BP stabilizes   AKI  (acute kidney injury) ([place]) -creatinine of 2.39 up from 1.86 secondary to bilateral hydronephrosis -follow creatinine after stenting  Complete heart block (HCC) S/p pacemaker and ICD  Atrial fibrillation, chronic (HCC) Continue sotalol and Eliquis once able to take PO      Advance Care Planning:   Code Status: DNR   Consults: Urology  Family Communication: Attempted to update wife by phone but sent to voicemail  Severity of Illness: The appropriate patient status for this patient is INPATIENT. Inpatient status is judged to be reasonable and necessary in order to provide the required intensity of service to ensure the patient's safety. The patient's presenting symptoms, physical exam findings, and initial radiographic and laboratory data in the context of their chronic comorbidities is felt to place them at high risk for further clinical deterioration. Furthermore, it is not anticipated that the patient will be medically stable for discharge from the hospital within 2 midnights of admission.   * I certify that at the point of admission it is my clinical judgment that the patient will require inpatient hospital care spanning beyond 2 midnights from the point of admission due to high intensity of service, high risk for further deterioration and high frequency of surveillance required.*  Author: Orene Desanctis, DO 05/11/2022 3:33 AM  For on call review [email or web address].

## 2022-05-10 NOTE — Consult Note (Addendum)
H&P Physician requesting consult: Eric Krueger  Chief Complaint: Bilateral ureteral stones, sepsis  History of Present Illness: Clinic note from earlier:  Eric Krueger is a 76 year old male seen in followup with urinary retention and metastatic prostate cancer.   Of note, he has a past medical history of Alzheimer's dementia, atrial fibrillation s/p pacemaker and defibrillator. He is on Eliquis. He follows with his cardiologist, Eric Krueger. He is seen today with [name]. Most of HPI is obtained from her as he does not talk much.   1. Urinary retention: He presented to ED on 07/06/2021 and found to be in urinary retention and acute renal failure. Renal ultrasound demonstrated moderate bilateral hydronephrosis. Creatinine was 13. Foley catheter was placed with return of 750 cc urine. His creatinine down trended and was most recently 1.4 on 07/08/2021 at his baseline. Renal ultrasound 07/07/2021 demonstrated resolved hydronephrosis. He failed a void trial on 07/22/2021.  TRUS 08/10/2021 demonstrated 65cm^3 prostate  Cystoscopy was withheld given concern of possible UTI.  PSA on 07/29/2021 was 3.26  I did counsel regarding possibility of hypotonic bladder, however we elected not to proceed with UDS and discussed that TURP was best chance of being catheter free.  -S/p TURP and bladder biopsy 09/17/2021 with path GS 5+4=9 adenocarcinoma of the prostate also in the bladder trigone biopsy  -Failed multiple void trials. He declined urodynamics.  -He is now managed with a suprapubic tube, placed by interventional radiology on 04/14/2022.   2. Castrate sensitive metastatic prostate cancer:  -S/p TURP 09/17/2021 with GS 5+4=9 adenocarcinoma of the prostate also in the bladder trigone biopsy  -Pre-op PSA on 07/29/2021 was 3.26  -CT A/P 09/26/2021 performed in [place] demonstrated pelvic lymphadenopathy and pelvic bony sclerotic lesions.  -PSMA PET scan 10/12/2021 with extensive nodal metastasis  in the iliac nodes, periaortic retroperitoneal nodes and extending to the mediastinum. He has a tiny right supraclavicular metastatic node. He has extensive skeletal metastasis involving the pelvis, proximal femurs, spine and sternum.  -CT A/P 03/10/2022 with interval decrease in size of retroperitoneal and pelvic sidewall lymphadenopathy. Skeletal metastasis have become more densely cirrhotic suggesting healing.  -Treatment: ADT. He has declined oral antiandrogens and chemotherapy.  -'240mg'$  Firmagon given on 09/27/2021. 45 mg Eligard given on 11/02/2021, 05/02/2022.  -Most recent PSA: 12 on 04/26/2022 which represents a rise from 1.7 on 01/25/2022.  -He has seen Eric Krueger and elected not to proceed with any chemotherapy or oral antiandrogens.  -He denies a FH of prostate cancer  -He denies bone pain. He has noticed about 7 pounds of weight loss over the past month. He is not eating as well. His wife has tried supplementing with Ensures.   3. Left hydronephrosis (initially due to lymphadenopathy and more recently in 02/2022 due to left ureteral stone):  PSMA PET scan 10/12/2021 with poor excretion from the left kidney with left hydroureteronephrosis with concern for obstructing lesion in the left vesicoureteral junction. This is likely due to prostate cancer. I was unable to identify the left ureteral orifice during time of TURP in 08/2021.  -CT A/P 03/11/2022 with 7 mm stone at left UVJ not visible on prior PET.  -I offered attempt at treating the stone however as they cannot identify the ureteral orifice given metastatic bladder cancer of trigone, he declines this option. He also declines nephrostomy tube.  He denies abdominal pain or flank pain. Creatinine on 04/16/2022 is stable at 1.86.   05/10/2022: 76 year old man who presents with his caretaker. The patient  has Alzheimer's but is more confused than normal. Per the caretaker, this has been worsening over the past week. She also reports that his suprapubic  catheter came out at some time and is unsure how long it has been out. The patient has been disoriented and more agitated. The caretaker reports he has been febrile up to 100.5. He is also not been walking which is abnormal for him. I advised that he proceed to the emergency department after I placed a Foley catheter.   As noted above, the patient's suprapubic tube came out and was unable to be replaced.  Now he has a Foley catheter.  He was clearly ill and febrile and there was concern for sepsis.  Therefore, sent him to the emergency department.  There, he was found to have severe right hydroureteronephrosis with a 7 mm calculus in the proximal right ureter and another 7 mm calculus at the right UVJ.  On the left, he had moderate to severe hydroureteronephrosis with a 4 mm calculus at the left ureterovesicular junction.  Creatinine up and is 2.39.  White blood cell count 9.2.  He was febrile to 102.5.  Vital signs otherwise stable.  As noted above, the patient has Alzheimer's and is confused.  Past Medical History:  Diagnosis Date   Anticoagulant long-term use    eliquis--- managed by cardiology   BPH with urinary obstruction    urologist-- dr Eric Krueger   CHB (complete heart block) ([place])    CKD (chronic kidney disease), stage III ([place])    Dual implantable cardioverter-defibrillator in situ 03/2011   followed by cardiologist-- dr Eric Krueger--   pt had DDD pacemaker and changed out for [facility] Jude)  Dual ICD 05/ 2012 for symptomatic polymorphic VT;  generator change 12/ 2016   Dyslipidemia    Early onset Alzheimer's dementia without behavioral disturbance [facility])    neurologist-- dr Eric Krueger   Foley catheter in place    History of gout    remote   History of pulmonary embolism 2011   per pt wife no previous blood clots prior to and none since 2011   Moderate tricuspid regurgitation    per last echo in care everywhere 05-18-2020  moderate to severe TR , RSVP 37-39mHg   Persistent atrial fibrillation ([place]     approx late 2011 to 01/ 2012 dx AFib  s/p DCCV 01/ 2012 unsuccessful   Sigmoid diverticulosis    SSS (sick sinus syndrome) ([place] 01/2011   03/ 2012 s/p  permanent pacemaker  then upgraded to dual ICD 05/ 2012 due to symptomatic VT   Urine retention 06/2021   has foley catheter   Ventricular tachycardia, 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   pt had recurrent syncopal episode, 03-29-2011 pacemaker intergatted in office baseline Afib and runs of NSVT which correlated with syncopal episodes  s/p removal pacemake and placement dual ICD   Past Surgical History:  Procedure Laterality Date   CARDIAC CATHETERIZATION  01/31/2011   '@[facility]'$ ;  no intracardiac shunting identified, mild volume expansion, normal pulmonary artery pressure, and adequate cardiac output   CARDIAC PACEMAKER PLACEMENT  01/2011   CARDIOVERSION  11/2010   unsuccessful   CATARACT EXTRACTION W/ INTRAOCULAR LENS IMPLANT Bilateral 2021   EP IMPLANTABLE DEVICE  04/04/2011   '@[facility]'$ ;  by dr mJerilynn Krueger drucker---  EP study with inducible polymorphic VT,  DDD pacemaker removed and  Implanted Dual ICD device  (St Jude)   ICD GENERATOR CHANGE  11/10/2015   '@[facility]'$    TONSILLECTOMY  1950  age 1   TRANSURETHRAL RESECTION OF PROSTATE N/A 09/17/2021   Procedure: TRANSURETHRAL RESECTION OF THE PROSTATE (TURP), BIPOLAR, BLADDER BIOPSY;  Surgeon: Janith Lima, MD;  Location: [facility];  Service: Urology;  Laterality: N/A;    Home Medications:  (Not in a hospital admission)  Allergies:  Allergies  Allergen Reactions   Nsaids     Cannot take d/t defibrillator/pacemaker   Saw Palmetto Diarrhea   Vytorin [Ezetimibe-Simvastatin]     myalgias   Adhesive [Tape] Rash    Family History  Problem Relation Age of Onset   Diabetes Mellitus II Other    Social History:  reports that he has never smoked. He has never used smokeless tobacco. He reports that he does not currently use alcohol. He reports that he does not use drugs.  ROS: A  complete review of systems was performed.  All systems are negative except for pertinent findings as noted. ROS   Physical Exam:  Vital signs in last 24 hours: Temp:  [99.8 F (37.7 C)-102.5 F (39.2 C)] 99.8 F (37.7 C) (06/20 1756) Krueger Rate:  [79-88] 79 (06/20 1845) Resp:  [15-28] 21 (06/20 1845) BP: (120-139)/(61-96) 130/61 (06/20 1845) SpO2:  [100 %] 100 % (06/20 1845) General: Confused, no acute distress HEENT: Normocephalic, atraumatic Neck: No JVD or lymphadenopathy Cardiovascular: Regular rate and rhythm Lungs: Regular rate and effort Abdomen: Soft, nontender, nondistended, no abdominal masses Back: No CVA tenderness   Laboratory Data:  Results for orders placed or performed during the hospital encounter of 05/10/22 (from the past 24 hour(s))  Resp Panel by RT-PCR (Flu A&B, Covid) Anterior Nasal Swab     Status: None   Collection Time: 05/10/22  5:05 PM   Specimen: Anterior Nasal Swab  Result Value Ref Range   SARS Coronavirus 2 by RT PCR NEGATIVE NEGATIVE   Influenza A by PCR NEGATIVE NEGATIVE   Influenza B by PCR NEGATIVE NEGATIVE  Lactic acid, plasma     Status: None   Collection Time: 05/10/22  5:05 PM  Result Value Ref Range   Lactic Acid, Venous 1.8 0.5 - 1.9 mmol/L  Comprehensive metabolic panel     Status: Abnormal   Collection Time: 05/10/22  5:05 PM  Result Value Ref Range   Sodium 142 135 - 145 mmol/L   Potassium 4.0 3.5 - 5.1 mmol/L   Chloride 109 98 - 111 mmol/L   CO2 22 22 - 32 mmol/L   Glucose, Bld 105 (H) 70 - 99 mg/dL   BUN 56 (H) 8 - 23 mg/dL   Creatinine, Ser 2.39 (H) 0.61 - 1.24 mg/dL   Calcium 10.2 8.9 - 10.3 mg/dL   Total Protein 6.9 6.5 - 8.1 g/dL   Albumin 3.3 (L) 3.5 - 5.0 g/dL   AST 36 15 - 41 U/L   ALT 15 0 - 44 U/L   Alkaline Phosphatase 59 38 - 126 U/L   Total Bilirubin 1.2 0.3 - 1.2 mg/dL   GFR, Estimated 27 (L) >60 mL/min   Anion gap 11 5 - 15  CBC with Differential     Status: Abnormal   Collection Time: 05/10/22  5:05  PM  Result Value Ref Range   WBC 9.2 4.0 - 10.5 K/uL   RBC 3.38 (L) 4.22 - 5.81 MIL/uL   Hemoglobin 9.8 (L) 13.0 - 17.0 g/dL   HCT 31.1 (L) 39.0 - 52.0 %   MCV 92.0 80.0 - 100.0 fL   MCH 29.0 26.0 - 34.0 pg  MCHC 31.5 30.0 - 36.0 g/dL   RDW 15.2 11.5 - 15.5 %   Platelets 325 150 - 400 K/uL   nRBC 0.0 0.0 - 0.2 %   Neutrophils Relative % 84 %   Neutro Abs 7.7 1.7 - 7.7 K/uL   Lymphocytes Relative 8 %   Lymphs Abs 0.7 0.7 - 4.0 K/uL   Monocytes Relative 7 %   Monocytes Absolute 0.7 0.1 - 1.0 K/uL   Eosinophils Relative 1 %   Eosinophils Absolute 0.1 0.0 - 0.5 K/uL   Basophils Relative 0 %   Basophils Absolute 0.0 0.0 - 0.1 K/uL   Immature Granulocytes 0 %   Abs Immature Granulocytes 0.04 0.00 - 0.07 K/uL  Protime-INR     Status: Abnormal   Collection Time: 05/10/22  5:05 PM  Result Value Ref Range   Prothrombin Time 25.2 (H) 11.4 - 15.2 seconds   INR 2.3 (H) 0.8 - 1.2  APTT     Status: Abnormal   Collection Time: 05/10/22  5:05 PM  Result Value Ref Range   aPTT 44 (H) 24 - 36 seconds  Troponin I (High Sensitivity)     Status: Abnormal   Collection Time: 05/10/22  5:05 PM  Result Value Ref Range   Troponin I (High Sensitivity) 88 (H) <18 ng/L  Urinalysis, Routine w reflex microscopic Anterior Nasal Swab     Status: Abnormal   Collection Time: 05/10/22  5:10 PM  Result Value Ref Range   Color, Urine YELLOW YELLOW   APPearance TURBID (A) CLEAR   Specific Gravity, Urine 1.014 1.005 - 1.030   pH 7.0 5.0 - 8.0   Glucose, UA NEGATIVE NEGATIVE mg/dL   Hgb urine dipstick MODERATE (A) NEGATIVE   Bilirubin Urine NEGATIVE NEGATIVE   Ketones, ur NEGATIVE NEGATIVE mg/dL   Protein, ur 100 (A) NEGATIVE mg/dL   Nitrite NEGATIVE NEGATIVE   Leukocytes,Ua LARGE (A) NEGATIVE   RBC / HPF >50 (H) 0 - 5 RBC/hpf   WBC, UA >50 (H) 0 - 5 WBC/hpf   Bacteria, UA MANY (A) NONE SEEN   WBC Clumps PRESENT    Mucus PRESENT    Recent Results (from the past 240 hour(s))  Resp Panel by RT-PCR  (Flu A&B, Covid) Anterior Nasal Swab     Status: None   Collection Time: 05/10/22  5:05 PM   Specimen: Anterior Nasal Swab  Result Value Ref Range Status   SARS Coronavirus 2 by RT PCR NEGATIVE NEGATIVE Final    Comment: (NOTE) SARS-CoV-2 target nucleic acids are NOT DETECTED.  The SARS-CoV-2 RNA is generally detectable in upper respiratory specimens during the acute phase of infection. The lowest concentration of SARS-CoV-2 viral copies this assay can detect is 138 copies/mL. A negative result does not preclude SARS-Cov-2 infection and should not be used as the sole basis for treatment or other patient management decisions. A negative result may occur with  improper specimen collection/handling, submission of specimen other than nasopharyngeal swab, presence of viral mutation(s) within the areas targeted by this assay, and inadequate number of viral copies(<138 copies/mL). A negative result must be combined with clinical observations, patient history, and epidemiological information. The expected result is Negative.  Fact Sheet for Patients:  EntrepreneurPulse.com.au  Fact Sheet for Healthcare Providers:  IncredibleEmployment.be  This test is no t yet approved or cleared by the [place] FDA and  has been authorized for detection and/or diagnosis of SARS-CoV-2 by FDA under an Emergency Use Authorization (EUA). This EUA will remain  in effect (meaning this test can be used) for the duration of the COVID-19 declaration under Section 564(b)(1) of the Act, 21 U.S.C.section 360bbb-3(b)(1), unless the authorization is terminated  or revoked sooner.       Influenza A by PCR NEGATIVE NEGATIVE Final   Influenza B by PCR NEGATIVE NEGATIVE Final    Comment: (NOTE) The Xpert Xpress SARS-CoV-2/FLU/RSV plus assay is intended as an aid in the diagnosis of influenza from Nasopharyngeal swab specimens and should not be used as a sole basis for  treatment. Nasal washings and aspirates are unacceptable for Xpert Xpress SARS-CoV-2/FLU/RSV testing.  Fact Sheet for Patients: EntrepreneurPulse.com.au  Fact Sheet for Healthcare Providers: IncredibleEmployment.be  This test is not yet approved or cleared by the [place] FDA and has been authorized for detection and/or diagnosis of SARS-CoV-2 by FDA under an Emergency Use Authorization (EUA). This EUA will remain in effect (meaning this test can be used) for the duration of the COVID-19 declaration under Section 564(b)(1) of the Act, 21 U.S.C. section 360bbb-3(b)(1), unless the authorization is terminated or revoked.  Performed at [facility], [place] [address], [place], [place] [address]    Creatinine: Recent Labs    05/10/22 1705  CREATININE 2.39*   CT scan personally reviewed and is detailed in history of present illness  Impression/Assessment:  Bilateral ureteral calculi Acute renal insufficiency Bilateral ureteral obstruction secondary to calculi Sepsis secondary to UTI  Plan:  Plan for cystoscopy with bilateral retrograde pyelogram, bilateral ureteral stent placement.  Risk and benefits discussed.  Patient is a DNR.  This was discussed with his wife at bedside by Dr. Gifford Shave.  He has received cefepime.  Antibiotics per hospitalist  Marton Redwood, III 05/10/2022, 7:22 PM

## 2022-05-10 NOTE — Assessment & Plan Note (Signed)
S/p pacemaker and ICD

## 2022-05-10 NOTE — Anesthesia Preprocedure Evaluation (Addendum)
Anesthesia Evaluation  Patient identified by MRN, date of birth, ID band Patient awake    Reviewed: Allergy & Precautions, NPO status , Patient's Chart, lab work & pertinent test results  Airway Mallampati: II  TM Distance: >3 FB Neck ROM: Full    Dental  (+) Teeth Intact, Dental Advisory Given   Pulmonary neg pulmonary ROS,     + wheezing      Cardiovascular Normal cardiovascular exam+ dysrhythmias (SSS) Atrial Fibrillation and Ventricular Tachycardia + pacemaker + Cardiac Defibrillator + Valvular Problems/Murmurs (TR)  Rhythm:Regular Rate:Normal     Neuro/Psych PSYCHIATRIC DISORDERS Dementia negative neurological ROS     GI/Hepatic negative GI ROS, Neg liver ROS,   Endo/Other  negative endocrine ROS  Renal/GU Renal InsufficiencyRenal disease Bladder dysfunction  Prostate cancer    Musculoskeletal negative musculoskeletal ROS (+)   Abdominal   Peds  Hematology  (+) Blood dyscrasia (Eliquis), anemia ,   Anesthesia Other Findings   Reproductive/Obstetrics                            Anesthesia Physical Anesthesia Plan  ASA: 4 and emergent  Anesthesia Plan: General   Post-op Pain Management: Ofirmev IV (intra-op)*   Induction: Intravenous  PONV Risk Score and Plan: 3 and Dexamethasone, Ondansetron and Treatment may vary due to age or medical condition  Airway Management Planned: LMA  Additional Equipment:   Intra-op Plan:   Post-operative Plan: Extubation in OR  Informed Consent: I have reviewed the patients History and Physical, chart, labs and discussed the procedure including the risks, benefits and alternatives for the proposed anesthesia with the patient or authorized representative who has indicated his/her understanding and acceptance.   Patient has DNR.  Discussed DNR with patient, Discussed DNR with power of attorney and Continue DNR.   Dental advisory given  Plan  Discussed with: CRNA  Anesthesia Plan Comments:        Anesthesia Quick Evaluation

## 2022-05-10 NOTE — Progress Notes (Signed)
A consult was received from an ED physician for cefepime and vancomycin per pharmacy dosing.  The patient's profile has been reviewed for ht/wt/allergies/indication/available labs.   A one time order has been placed for cefepime 2g and vancomycin 1g.  Further antibiotics/pharmacy consults should be ordered by admitting physician if indicated.                       Thank you, Peggyann Juba, PharmD, BCPS 05/10/2022  4:39 PM

## 2022-05-10 NOTE — Op Note (Signed)
Operative Note  Preoperative diagnosis:  1.  Bilateral ureteral calculus, sepsis secondary to UTI  Post operative diagnosis: 1.  Bilateral ureteral calculus, sepsis secondary to UTI  Procedure(s): 1.  Cystoscopy with bilateral retrograde pyelogram and bilateral ureteral stent placement  Surgeon: Link Snuffer, MD  Assistants: None  Anesthesia: General  Complications: None immediate  EBL: Minimal  Specimens: 1.  Right renal pelvis urine culture 2.  Left renal pelvis urine culture  Drains/Catheters: 1.  Bilateral 6 X 26 double-J ureteral stent 2.  20 French Foley catheter  Intraoperative findings: 1.  Normal urethra with wide open prostatic urethra with evidence of prior resection.  Bladder neck raised and edematous especially on the left.  Easily able to locate the right ureteral orifice.  Left ureteral orifice very difficult to locate and was obscured by a calcification.  Finally able to access the left ureter.   Right retrograde pyelogram revealed a filling defect at the level of the stone with upstream hydroureteronephrosis.  Left retrograde pyelogram revealed severe hydroureteronephrosis.  Indication: 76 year old male with a history of castrate sensitive metastatic prostate cancer and urinary retention.  He is normally managed with a suprapubic tube that was recently placed but the tube fell out.  Patient presented to the clinic and appeared ill.  Foley catheter was placed and went to the emergency department where he was diagnosed with bilateral ureteral stones with severe hydroureteronephrosis.  He was febrile with signs of sepsis.  Decision was made to proceed with the above operation.  Description of procedure:  The patient was identified and consent was obtained.  The patient was taken to the operating room and placed in the supine position.  The patient was placed under general anesthesia.  Perioperative antibiotics were administered.  The patient was placed in dorsal  lithotomy.  Patient was prepped and draped in a standard sterile fashion and a timeout was performed.  A 21 French rigid cystoscope was advanced into the urethra and into the bladder.  The right distal most portion of the ureter was cannulated with an open-ended ureteral catheter.  Retrograde pyelogram was performed with the findings noted above.  A sensor wire was then advanced up to the kidney under fluoroscopic guidance.  An open-ended ureteral catheter was advanced over the wire to the renal pelvis.  Shot another retrograde pyelogram which revealed severe hydronephrosis.  Culture was obtained from the right renal pelvis.  Wire was reintroduced into the kidney and open-ended ureteral catheter withdrawn.  A 6 X 26 double-J ureteral stent was advanced up to the kidney under fluoroscopic guidance.  The wire was withdrawn and fluoroscopy confirmed good proximal placement and direct visualization confirmed a good coil within the bladder.    The left ureteral orifice was difficult to locate.  There was a great deal of edema versus involvement from his known prostate cancer.  There was a calcification in the area of the ureteral orifice that appeared to be a chronic calcification with surrounding edema.  Initial attempts at wire placement resulted in back walling the bladder.  I was finally able to access the left ureter.  I advanced an open-ended ureteral catheter over the wire and withdrew the wire and shot a retrograde pyelogram.  This revealed severe ureteral dilation.  The ureter was torturous and contrast did not extend past the mid ureter.  I passed a Glidewire through the open-ended ureteral catheter and was able to advance it into the kidney.  I advanced the open-ended ureteral catheter up further.  I withdrew the wire and shot another retrograde pyelogram with severe hydronephrosis.  Culture was collected.  A sensor wire was reintroduced into the kidney and the open-ended ureteral catheter withdrawn.  A 6  X 26 double-J ureteral stent was advanced up to the kidney under fluoroscopic guidance.  The wire was withdrawn and fluoroscopy confirmed good proximal placement and direct visualization confirmed a good coil within the bladder.  The scope was withdrawn and a 20 French Foley catheter was placed.  This concluded the operation.  Patient tolerated procedure well and was stable postoperatively.  Plan: Continue IV antibiotics.  Follow-up cultures.  I will notify Dr. Abner Greenspan of his admission.

## 2022-05-10 NOTE — ED Provider Notes (Signed)
[facility] DEPT Provider Note   CSN: [id number] Arrival date & time: 05/10/22  1613     History  No chief complaint on file.   Eric Krueger is a 76 y.o. male.  Level 5 caveat for acuity of condition and confusion and dementia.  Patient sent from [facility] urology office with confusion and generalized decline over the past 2 weeks with concern for infection.  Patient has metastatic prostate cancer and caregiver reports he has pulled out his suprapubic catheter several times within the past 2 weeks.  They were at the urologist office today who was unable to reinsert the suprapubic catheter but they placed 1 in his penis and sent him to the ED.  EMS reports a fever of 105 last night.  Caregiver at bedside reports patient has had increased confusion, generalized shakes, incontinence of bowel and bladder, abdominal pain and back pain and generalized poor performance over the past 2 weeks.  Patient is oriented to person and place.  Complains of lower abdominal pain and back pain.  Denies any difficulty breathing or chest pain.  The history is provided by the patient and the EMS personnel.       Home Medications Prior to Admission medications   Medication Sig Start Date End Date Taking? Authorizing Provider  acetaminophen (TYLENOL) 500 MG tablet Take 500 mg by mouth every 6 (six) hours as needed. 09/22/16   [provider]  atorvastatin (LIPITOR) 10 MG tablet Take 10 mg by mouth at bedtime. 02/15/15   [provider]  ELIQUIS 5 MG TABS tablet Take 1 tablet (5 mg total) by mouth 2 (two) times daily. 09/19/21   Festus Aloe, MD  fenofibrate (TRICOR) 145 MG tablet Take 145 mg by mouth daily. 02/15/15   [provider]  memantine (NAMENDA) 10 MG tablet Take 10 mg by mouth 2 (two) times daily. 2 tab daily for memory 06/02/21   [provider]  mirtazapine (REMERON) 15 MG tablet Take 15 mg by mouth at bedtime. 03/21/21   [provider]  sotalol (BETAPACE) 80 MG tablet Take 80 mg by mouth 2 (two) times daily. 03/28/15   [provider]  tamsulosin (FLOMAX) 0.4 MG CAPS capsule Take 0.4 mg by mouth daily. 05/06/21   [provider]      Allergies    Nsaids, Saw palmetto, Vytorin [ezetimibe-simvastatin], and Adhesive [tape]    Review of Systems   Review of Systems  Constitutional:  Positive for activity change, appetite change, fatigue and fever.  HENT:  Negative for congestion.   Respiratory:  Positive for cough. Negative for shortness of breath.   Cardiovascular:  Negative for chest pain.  Gastrointestinal:  Positive for abdominal pain, nausea and vomiting.  Genitourinary:  Positive for difficulty urinating and dysuria.  Musculoskeletal:  Positive for arthralgias and myalgias.  Skin:  Negative for rash.  Neurological:  Positive for dizziness, weakness and light-headedness.   all other systems are negative except as noted in the HPI and PMH.    Physical Exam Updated Vital Signs BP 130/61   Pulse 79   Temp 99.8 F (37.7 C) (Oral)   Resp (!) 21   SpO2 100%  Physical Exam Vitals and nursing note reviewed.  Constitutional:      General: He is not in acute distress.    Appearance: He is well-developed. He is ill-appearing.     Comments: Tremulous, dry appearing, oriented times 1  HENT:     Head: Normocephalic and  atraumatic.     Mouth/Throat:     Mouth: Mucous membranes are dry.     Pharynx: No oropharyngeal exudate.  Eyes:     Conjunctiva/sclera: Conjunctivae normal.     Pupils: Pupils are equal, round, and reactive to light.  Neck:     Comments: No meningismus. Cardiovascular:     Rate and Rhythm: Tachycardia present. Rhythm irregular.     Heart sounds: Normal heart sounds. No murmur heard. Pulmonary:     Effort: Pulmonary effort is normal. No respiratory distress.     Breath sounds: Normal breath sounds.  Abdominal:     Palpations: Abdomen is soft.     Tenderness: There  is abdominal tenderness. There is no guarding or rebound.     Comments: Suprapubic catheter site is covered with a bandage.  There is a mild surrounding erythema.  Diffuse lower abdominal tenderness with voluntary guarding.  Genitourinary:    Comments: Urethral catheter draining dark yellow urine Musculoskeletal:        General: No tenderness. Normal range of motion.     Cervical back: Normal range of motion and neck supple.  Skin:    General: Skin is warm.  Neurological:     Mental Status: He is alert.     Cranial Nerves: No cranial nerve deficit.     Motor: No abnormal muscle tone.     Coordination: Coordination normal.     Comments: Oriented to person and place.  Moves all extremities and follows commands intermittently.  Psychiatric:        Behavior: Behavior normal.     ED Results / Procedures / Treatments   Labs (all labs ordered are listed, but only abnormal results are displayed) Labs Reviewed  COMPREHENSIVE METABOLIC PANEL - Abnormal; Notable for the following components:      Result Value   Glucose, Bld 105 (*)    BUN 56 (*)    Creatinine, Ser 2.39 (*)    Albumin 3.3 (*)    GFR, Estimated 27 (*)    All other components within normal limits  CBC WITH DIFFERENTIAL/PLATELET - Abnormal; Notable for the following components:   RBC 3.38 (*)    Hemoglobin 9.8 (*)    HCT 31.1 (*)    All other components within normal limits  PROTIME-INR - Abnormal; Notable for the following components:   Prothrombin Time 25.2 (*)    INR 2.3 (*)    All other components within normal limits  APTT - Abnormal; Notable for the following components:   aPTT 44 (*)    All other components within normal limits  URINALYSIS, ROUTINE W REFLEX MICROSCOPIC - Abnormal; Notable for the following components:   APPearance TURBID (*)    Hgb urine dipstick MODERATE (*)    Protein, ur 100 (*)    Leukocytes,Ua LARGE (*)    RBC / HPF >50 (*)    WBC, UA >50 (*)    Bacteria, UA MANY (*)    All other  components within normal limits  TROPONIN I (HIGH SENSITIVITY) - Abnormal; Notable for the following components:   Troponin I (High Sensitivity) 88 (*)    All other components within normal limits  RESP PANEL BY RT-PCR (FLU A&B, COVID) ARPGX2  CULTURE, BLOOD (ROUTINE X 2)  CULTURE, BLOOD (ROUTINE X 2)  URINE CULTURE  LACTIC ACID, PLASMA  LACTIC ACID, PLASMA  TROPONIN I (HIGH SENSITIVITY)    EKG EKG Interpretation  Date/Time:  Tuesday May 10 2022 16:32:49 EDT Ventricular Rate:  88 PR Interval:    QRS Duration: 102 QT Interval:  431 QTC Calculation: 498 R Axis:   49 Text Interpretation: Atrial fibrillation Abnormal R-wave progression, early transition Repol abnrm suggests ischemia, anterolateral Nonspecific T wave abnormality Confirmed by Ezequiel Essex [phone]) on 05/10/2022 4:46:38 PM  Radiology CT ABDOMEN PELVIS WO CONTRAST  Result Date: 05/10/2022 CLINICAL DATA:  Sepsis, AMS, abdominal pain, dark cloudy urine EXAM: CT ABDOMEN AND PELVIS WITHOUT CONTRAST TECHNIQUE: Multidetector CT imaging of the abdomen and pelvis was performed following the standard protocol without IV contrast. RADIATION DOSE REDUCTION: This exam was performed according to the departmental dose-optimization program which includes automated exposure control, adjustment of the mA and/or kV according to patient size and/or use of iterative reconstruction technique. COMPARISON:  March 10, 2022 FINDINGS: Lower chest: No acute abnormality. Moderate cardiomegaly. No pericardial effusion. Hepatobiliary: No focal liver abnormality is seen. No gallstones, gallbladder wall thickening, or biliary dilatation. Pancreas: Unremarkable. No pancreatic ductal dilatation or surrounding inflammatory changes. Spleen: Normal in size without focal abnormality. Adrenals/Urinary Tract: Both the adrenals have a normal appearance. At the right side, there is severe hydronephrosis and proximal hydroureter seen secondary to 7 mm calculus at the  proximal ureter. The distal ureteral stone is dilated likely secondary to 7 mm calculus at the right UVJ. Right hydronephrosis and hydroureter is new since the previous study. At the left side, there is moderate to severe hydronephrosis and hydroureter seen with a 4 mm calculus at the left UVJ (image 76 of series 5) in comparison to 6.6 mm calculus in the previous study. Again seen is the Foley catheter in the urinary bladder. There is apparent severe thickening of the bladder wall. Stomach/Bowel: Stomach is within normal limits. Appendix appears normal. No evidence of bowel wall thickening, distention, or inflammatory changes. Large stool burden in the colon including the rectum of constipation. No bowel obstruction. Vascular/Lymphatic: Moderately severe atheromatous calcifications in the abdominal aorta extending into the iliac arteries. Reproductive: Prostate is unremarkable. Other: Bilateral inguinal hernia with a right-sided hernia containing fat with loop of the small bowel extending to the orifice of the inguinal canal. Left groin hernia contains a short segment of the sigmoid colon without complication. 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 IMPRESSION: There is severe right hydronephrosis and hydroureter seen with a 7 mm calculus at the proximal right ureter and a 7 mm calculus at the right UVJ. Obstructing hydronephrosis is new since the previous study. At the left side, there is moderate to severe hydronephrosis and hydroureter seen with a 4 mm calculus at the left UVJ in comparison to 6.6 mm calculus in the previous study. Foley catheter in place with apparent severe thickening of the bladder wall. 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 Large stool burden in the colon of constipation. Bilateral uncomplicated inguinal hernias. Aortic atherosclerosis. Electronically Signed   By: Frazier Richards M.D.   On: 05/10/2022 18:11    CT Head Wo Contrast  Result Date: 05/10/2022 CLINICAL DATA:  Altered mental status EXAM: CT HEAD WITHOUT CONTRAST TECHNIQUE: Contiguous axial images were obtained from the base of the skull through the vertex without intravenous contrast. RADIATION DOSE REDUCTION: This exam was performed according to the departmental dose-optimization program which includes automated exposure control, adjustment of the mA and/or kV according to patient size and/or use of iterative reconstruction technique. COMPARISON:  None Available. FINDINGS: Brain: There is no acute intracranial hemorrhage, extra-axial fluid collection, or acute infarct. Parenchymal volume is within  normal limits for age. The ventricles are normal in size. Gray-white differentiation is preserved. There is no mass lesion. There is no mass effect or midline shift. 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 Vascular: There is calcification of the bilateral cavernous ICAs and vertebral arteries. Additional calcification is seen in the right sylvian fissure and over the right cerebral hemisphere which may reflect atherosclerotic plaque or calcific emboli of indeterminate age. Skull: Normal. Negative for fracture or focal lesion. Sinuses/Orbits: The imaged paranasal sinuses are clear. Bilateral lens implants are in place. The globes and orbits are otherwise unremarkable. Other: None. IMPRESSION: 1. No evidence of acute intracranial pathology. 2. Calcification in the right sylvian fissure and overlying the right cerebral hemisphere may reflect atherosclerotic plaque or calcific emboli of indeterminate age. If there is clinical concern for infarct, consider MRI or CTA for further evaluation. Electronically Signed   By: Valetta Mole M.D.   On: 05/10/2022 17:58   DG Chest Port 1 View  Result Date: 05/10/2022 CLINICAL DATA:  Questionable sepsis. Evaluate for abnormality. Altered mental status. Dark cloudy  urine. Fever. EXAM: PORTABLE CHEST 1 VIEW COMPARISON:  AP chest 07/04/2021 FINDINGS: Left chest wall cardiac AICD is again seen with leads overlying the right atrium and right ventricle. Cardiac silhouette is again moderately enlarged. Mediastinal contours are within normal limits with moderate calcification again seen within aortic arch. The lungs are clear. No pleural effusion or pneumothorax. Mild multilevel degenerative disc changes of the thoracic spine. IMPRESSION: Stable moderately enlarged cardiac silhouette. No acute lung process. Electronically Signed   By: Yvonne Kendall M.D.   On: 05/10/2022 17:08    Procedures .Critical Care  Performed by: Ezequiel Essex, MD Authorized by: Ezequiel Essex, MD   Critical care provider statement:    Critical care time (minutes):  45   Critical care time was exclusive of:  Separately billable procedures and treating other patients   Critical care was necessary to treat or prevent imminent or life-threatening deterioration of the following conditions:  Sepsis and dehydration   Critical care was time spent personally by me on the following activities:  Development of treatment plan with patient or surrogate, discussions with consultants, evaluation of patient's response to treatment, examination of patient, ordering and review of laboratory studies, ordering and review of radiographic studies, ordering and performing treatments and interventions, pulse oximetry, re-evaluation of patient's condition and review of old charts   I assumed direction of critical care for this patient from another provider in my specialty: no     Care discussed with: admitting provider       Medications Ordered in ED Medications  lactated ringers infusion (has no administration in time range)  lactated ringers bolus 1,000 mL (has no administration in time range)  ceFEPIme (MAXIPIME) 2 g in sodium chloride 0.9 % 100 mL IVPB (has no administration in time range)  metroNIDAZOLE  (FLAGYL) IVPB 500 mg (has no administration in time range)  vancomycin (VANCOCIN) IVPB 1000 mg/200 mL premix (has no administration in time range)    ED Course/ Medical Decision Making/ A&P                           Medical Decision Making Amount and/or Complexity of Data Reviewed Independent Historian: EMS Labs: ordered. Radiology: ordered and independent interpretation performed. Decision-making details documented in ED Course. ECG/medicine tests: ordered and independent interpretation performed. Decision-making details documented in ED Course.  Risk Prescription drug management. Decision regarding hospitalization.  Confusion, fever, behavior change for the past several days.  Recently dislodged suprapubic catheter.  Febrile and tachycardic on EMS arrival.  Code sepsis activated.  Patient started on broad-spectrum antibiotics and IV fluids after cultures are obtained.  No recent urine cultures in system '' patient started on broad-spectrum antibiotics after cultures were obtained.  CT scan is concerning for multiple large kidney stones with hydronephrosis bilaterally.  There is a UVJ stone bilaterally as well as the mid ureter right. Bilateral obstructive uropathy is seen on CT scan.  Nonspecific calcification seen on CT head with need for likely MRI.   Lab studies are remarkable for stable chronic anemia, worsening kidney function likely due to obstructive uropathy. Blood and urine cultures are sent.  Patient given IV cefepime, vancomycin and Flagyl.  No recent urine cultures in system.  Discussed with Dr. Gloriann Loan with urology.  He will plan on stenting tonight and recommends hospitalist admission for sepsis  Family updated. D/w Dr. Flossie Buffy.         Final Clinical Impression(s) / ED Diagnoses Final diagnoses:  Sepsis with encephalopathy without septic shock, due to unspecified organism [facility])  Obstructive uropathy    Rx / DC Orders ED Discharge Orders     None          Ezequiel Essex, MD 05/10/22 1928

## 2022-05-10 NOTE — Assessment & Plan Note (Signed)
Continue sotalol and Eliquis

## 2022-05-11 DIAGNOSIS — A419 Sepsis, unspecified organism: Secondary | ICD-10-CM | POA: Diagnosis not present

## 2022-05-11 DIAGNOSIS — R7989 Other specified abnormal findings of blood chemistry: Secondary | ICD-10-CM

## 2022-05-11 DIAGNOSIS — R6521 Severe sepsis with septic shock: Secondary | ICD-10-CM | POA: Diagnosis not present

## 2022-05-11 DIAGNOSIS — E440 Moderate protein-calorie malnutrition: Secondary | ICD-10-CM

## 2022-05-11 DIAGNOSIS — R778 Other specified abnormalities of plasma proteins: Secondary | ICD-10-CM

## 2022-05-11 HISTORY — DX: Other specified abnormalities of plasma proteins: R77.8

## 2022-05-11 HISTORY — DX: Sepsis, unspecified organism: A41.9

## 2022-05-11 HISTORY — DX: Other specified abnormal findings of blood chemistry: R79.89

## 2022-05-11 LAB — BASIC METABOLIC PANEL
Anion gap: 8 (ref 5–15)
BUN: 48 mg/dL — ABNORMAL HIGH (ref 8–23)
CO2: 21 mmol/L — ABNORMAL LOW (ref 22–32)
Calcium: 9.1 mg/dL (ref 8.9–10.3)
Chloride: 112 mmol/L — ABNORMAL HIGH (ref 98–111)
Creatinine, Ser: 1.85 mg/dL — ABNORMAL HIGH (ref 0.61–1.24)
GFR, Estimated: 37 mL/min — ABNORMAL LOW (ref >60)
Glucose, Bld: 151 mg/dL — ABNORMAL HIGH (ref 70–99)
Potassium: 3.9 mmol/L (ref 3.5–5.1)
Sodium: 141 mmol/L (ref 135–145)

## 2022-05-11 LAB — CBC
HCT: 27.5 % — ABNORMAL LOW (ref 39.0–52.0)
Hemoglobin: 8.4 g/dL — ABNORMAL LOW (ref 13.0–17.0)
MCH: 28.7 pg (ref 26.0–34.0)
MCHC: 30.5 g/dL (ref 30.0–36.0)
MCV: 93.9 fL (ref 80.0–100.0)
Platelets: 263 10*3/uL (ref 150–400)
RBC: 2.93 MIL/uL — ABNORMAL LOW (ref 4.22–5.81)
RDW: 15.2 % (ref 11.5–15.5)
WBC: 6.2 10*3/uL (ref 4.0–10.5)
nRBC: 0 % (ref 0.0–0.2)

## 2022-05-11 LAB — TROPONIN I (HIGH SENSITIVITY)
Troponin I (High Sensitivity): 120 ng/L (ref <18)
Troponin I (High Sensitivity): 75 ng/L — ABNORMAL HIGH (ref <18)
Troponin I (High Sensitivity): 78 ng/L — ABNORMAL HIGH (ref <18)

## 2022-05-11 LAB — URINE CULTURE: Culture: NO GROWTH

## 2022-05-11 LAB — MRSA NEXT GEN BY PCR, NASAL: MRSA by PCR Next Gen: DETECTED — AB

## 2022-05-11 LAB — GLUCOSE, CAPILLARY: Glucose-Capillary: 152 mg/dL — ABNORMAL HIGH (ref 70–99)

## 2022-05-11 MED ORDER — ADULT MULTIVITAMIN W/MINERALS CH
1.0000 | ORAL_TABLET | Freq: Every day | ORAL | Status: DC
  Administered 2022-05-11 – 2022-05-20 (×10): 1 via ORAL
  Filled 2022-05-11 (×10): qty 1

## 2022-05-11 MED ORDER — SODIUM CHLORIDE 0.9 % IV SOLN
250.0000 mL | INTRAVENOUS | Status: DC
  Administered 2022-05-15: 250 mL via INTRAVENOUS

## 2022-05-11 MED ORDER — ACETAMINOPHEN 650 MG RE SUPP: 650.0000 mg | RECTAL | Status: DC | PRN

## 2022-05-11 MED ORDER — NOREPINEPHRINE 4 MG/250ML-% IV SOLN
2.0000 ug/min | INTRAVENOUS | Status: DC
  Administered 2022-05-11 (×2): 2 ug/min via INTRAVENOUS
  Administered 2022-05-13: 3 ug/min via INTRAVENOUS
  Filled 2022-05-11 (×3): qty 250

## 2022-05-11 MED ORDER — ENSURE ENLIVE PO LIQD
237.0000 mL | Freq: Two times a day (BID) | ORAL | Status: DC
  Administered 2022-05-12 – 2022-05-19 (×13): 237 mL via ORAL

## 2022-05-11 MED ORDER — SODIUM CHLORIDE 0.9 % IV SOLN
2.0000 g | INTRAVENOUS | Status: DC
  Filled 2022-05-11: qty 12.5

## 2022-05-11 MED ORDER — SODIUM CHLORIDE 0.9 % IV SOLN
2.0000 g | Freq: Two times a day (BID) | INTRAVENOUS | Status: DC
  Administered 2022-05-11 – 2022-05-13 (×5): 2 g via INTRAVENOUS
  Filled 2022-05-11 (×5): qty 12.5

## 2022-05-11 MED ORDER — MIRTAZAPINE 15 MG PO TABS
15.0000 mg | ORAL_TABLET | Freq: Once | ORAL | Status: AC
Start: 2022-05-11 — End: 2022-05-11
  Administered 2022-05-11: 15 mg via ORAL
  Filled 2022-05-11: qty 1

## 2022-05-11 MED ORDER — VANCOMYCIN HCL IN DEXTROSE 1-5 GM/200ML-% IV SOLN: 1000.0000 mg | INTRAVENOUS | Status: DC

## 2022-05-11 MED ORDER — METRONIDAZOLE 500 MG/100ML IV SOLN
500.0000 mg | Freq: Two times a day (BID) | INTRAVENOUS | Status: DC
  Administered 2022-05-11 – 2022-05-12 (×3): 500 mg via INTRAVENOUS
  Filled 2022-05-11 (×3): qty 100

## 2022-05-11 MED ORDER — ORAL CARE MOUTH RINSE: 15.0000 mL | OROMUCOSAL | Status: DC | PRN

## 2022-05-11 MED ORDER — VANCOMYCIN HCL 1500 MG/300ML IV SOLN
1500.0000 mg | INTRAVENOUS | Status: DC
Start: 2022-05-12 — End: 2022-05-11

## 2022-05-11 MED ORDER — SENNOSIDES-DOCUSATE SODIUM 8.6-50 MG PO TABS
1.0000 | ORAL_TABLET | Freq: Two times a day (BID) | ORAL | Status: DC
  Administered 2022-05-11 – 2022-05-20 (×16): 1 via ORAL
  Filled 2022-05-11 (×18): qty 1

## 2022-05-11 MED ORDER — MIRTAZAPINE 15 MG PO TABS
30.0000 mg | ORAL_TABLET | Freq: Once | ORAL | Status: DC
Start: 2022-05-11 — End: 2022-05-11

## 2022-05-11 MED ORDER — LIP MEDEX EX OINT
TOPICAL_OINTMENT | CUTANEOUS | Status: DC | PRN
Start: 2022-05-11 — End: 2022-05-20
  Filled 2022-05-11: qty 7

## 2022-05-11 MED ORDER — POLYETHYLENE GLYCOL 3350 17 G PO PACK
17.0000 g | PACK | Freq: Every day | ORAL | Status: DC
  Administered 2022-05-12 – 2022-05-19 (×8): 17 g via ORAL
  Filled 2022-05-11 (×9): qty 1

## 2022-05-11 MED ORDER — ORAL CARE MOUTH RINSE
15.0000 mL | OROMUCOSAL | Status: DC
  Administered 2022-05-11 – 2022-05-12 (×5): 15 mL via OROMUCOSAL

## 2022-05-11 MED ORDER — ACETAMINOPHEN 325 MG PO TABS
650.0000 mg | ORAL_TABLET | Freq: Four times a day (QID) | ORAL | Status: DC | PRN
  Administered 2022-05-15 (×2): 650 mg via ORAL
  Filled 2022-05-11 (×2): qty 2

## 2022-05-11 MED ORDER — BOOST / RESOURCE BREEZE PO LIQD CUSTOM
1.0000 | ORAL | Status: DC
  Administered 2022-05-12 – 2022-05-18 (×6): 1 via ORAL

## 2022-05-11 NOTE — Progress Notes (Signed)
Initial Nutrition Assessment  DOCUMENTATION CODES:   Non-severe (moderate) malnutrition in context of chronic illness  INTERVENTION:  - will order Boost Breeze once/day, each supplement provides 250 kcal and 9 grams of protein. - will order Ensure Plus High Protein BID, each supplement provides 350 kcal and 20 grams of protein. - will order 1 tablet multivitamin with minerals/day.   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  GOAL:   Patient will meet greater than or equal to 90% of their needs  MONITOR:   PO intake, Supplement acceptance, Labs, Weight trends  REASON FOR ASSESSMENT:   Malnutrition Screening Tool  ASSESSMENT:   76 y.o. 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 He presented to the ED due to increased AMS and fever. In the ED, CT abdomen/pelvis showed bilateral severe hydronephrosis, hydroureter on the right is new, 81m calculus at the right UPJ, 717min proximal ureter, and a 4 mm calculus at the left UVJ. He was admitted due to septic shock.  Diet advanced from NPO to Regular, thin liquids after SLP evaluation today.  Patient sitting up in bed and lunch tray had been delivered but was on the other side of room. No visitors present at the time of visit; his wife had left prior to RD arrival. Patient confused throughout RD visit and was asking where his wife was and needed reminders of where he was.   Set up lunch tray for patient but he was unable to feed himself. Let RN know that tray had arrived and patient would need assistance.   Weight today is 172 lb, weight on 5/25 was 168 lb, and  weight on 1/31 was 170 lb.   He is POD #1 cystoscopy with bilateral retrograde pyelogram and bilateral ureteral stent placement.    Labs reviewed; CBG: 152 mg/dl, Cl: 11219m/l, BUN: 48 mg/dl, creatinine: 1.85 mg/dl, GFR: 37 ml/min. Medications reviewed.    NUTRITION - FOCUSED PHYSICAL EXAM:  Flowsheet Row Most Recent Value  Orbital Region Moderate depletion  Upper Arm Region Moderate depletion  Thoracic and Lumbar Region Unable to assess  Buccal Region Moderate depletion  Temple Region Moderate depletion  Clavicle Bone Region Moderate depletion  Clavicle and Acromion Bone Region Moderate depletion  Scapular Bone Region Moderate depletion  Dorsal Hand Moderate depletion  Patellar Region Unable to assess  Anterior Thigh Region Unable to assess  Posterior Calf Region Unable to assess  Edema (RD Assessment) Unable to assess  Hair Reviewed  Eyes Reviewed  Mouth Reviewed  Skin Reviewed  Nails Reviewed       Diet Order:   Diet Order             Diet regular Room service appropriate? Yes; Fluid consistency: Thin  Diet effective now                   EDUCATION NEEDS:   Not appropriate for education at this time  Skin:  Skin Assessment: Skin Integrity Issues: Skin Integrity Issues:: Incisions Incisions: perineum (6/20)  Last BM:  PTA/unknown  Height:   Ht Readings from Last 1 Encounters:  05/11/22 '6\' 3"'$  (1.905 m)    Weight:   Wt Readings from Last 1 Encounters:  05/11/22 78 kg  BMI:  Body mass index is 21.49 kg/m.  Estimated Nutritional Needs:  Kcal:  2100-2300 kcal Protein:  105-115 grams Fluid:  >/= 2.5 L/day     Jarome Matin, MS, RD, LDN, CNSC Registered Dietitian II Inpatient Clinical Nutrition RD pager # and on-call/weekend pager # available in [facility]

## 2022-05-11 NOTE — Progress Notes (Signed)
An USGPIV (ultrasound guided PIV) has been placed for short-term vasopressor infusion. A correctly placed ivWatch must be used when administering Vasopressors. Should this treatment be needed beyond 72 hours, central line access should be obtained.  It will be the responsibility of the bedside nurse to follow best practice to prevent extravasations.   ?

## 2022-05-11 NOTE — Evaluation (Signed)
Clinical/Bedside Swallow Evaluation Patient Details  Name: Eric Krueger MRN: [id number] Date of Birth: 12-25-1945  Today's Date: 05/11/2022 Time: SLP Start Time (ACUTE ONLY): 1347 SLP Stop Time (ACUTE ONLY): 1412 SLP Time Calculation (min) (ACUTE ONLY): 25 min  Past Medical History:  Past Medical History:  Diagnosis Date   Anticoagulant long-term use    eliquis--- managed by cardiology   BPH with urinary obstruction    urologist-- dr gay   CHB (complete heart block) ([place])    CKD (chronic kidney disease), stage III ([place])    Dual implantable cardioverter-defibrillator in situ 03/2011   followed by cardiologist-- dr Rowland Lathe--   pt had DDD pacemaker and changed out for [facility] Jude)  Dual ICD 05/ 2012 for symptomatic polymorphic VT;  generator change 12/ 2016   Dyslipidemia    Early onset Alzheimer's dementia without behavioral disturbance [facility])    neurologist-- dr d. Bjorn Loser   Foley catheter in place    History of gout    remote   History of pulmonary embolism 2011   per pt wife no previous blood clots prior to and none since 2011   Moderate tricuspid regurgitation    per last echo in care everywhere 05-18-2020  moderate to severe TR , RSVP 37-6mHg   Persistent atrial fibrillation ([place]    approx late 2011 to 01/ 2012 dx AFib  s/p DCCV 01/ 2012 unsuccessful   Sigmoid diverticulosis    SSS [place]ick [place] 01/2011   03/ 2012 s/p  permanent pacemaker  then upgraded to dual ICD 05/ 2012 due to symptomatic VT   Urine retention 06/2021   has foley catheter   Ventricular tachycardia, 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[place] 03/2011   pt had recurrent syncopal episode, 03-29-2011 pacemaker intergatted in office baseline Afib and runs of NSVT which correlated with syncopal episodes  s/p removal pacemake and placement dual ICD   Past Surgical History:  Past Surgical History:  Procedure Laterality Date   CARDIAC CATHETERIZATION  01/31/2011   '@[facility]'$ ;  no intracardiac shunting identified, mild volume  expansion, normal pulmonary artery pressure, and adequate cardiac output   CARDIAC PACEMAKER PLACEMENT  01/2011   CARDIOVERSION  11/2010   unsuccessful   CATARACT EXTRACTION W/ INTRAOCULAR LENS IMPLANT Bilateral 2021   EP IMPLANTABLE DEVICE  04/04/2011   '@[facility]'$ ;  by dr mJerilynn Mages drucker---  EP study with inducible polymorphic VT,  DDD pacemaker removed and  Implanted Dual ICD device  ([employer] Jude)   ICD GENERATOR CHANGE  11/10/2015   '@[facility]'$    TONSILLECTOMY  1950   age 76  TRANSURETHRAL RESECTION OF PROSTATE N/A 09/17/2021   Procedure: TRANSURETHRAL RESECTION OF THE PROSTATE (TURP), BIPOLAR, BLADDER BIOPSY;  Surgeon: GJanith Lima MD;  [facility]  Service: Urology;  Laterality: N/A;   HPI:  Eric MARIANIis a 76y.o. 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 Pt NPO with BSE ordered.    Assessment / Plan / Recommendation  Clinical Impression  Pt seen for clnical swallowing evaluation with cognitive-based dysphagia noted characterized by oral holding, delayed initiation of the swallow and min verbal/tactile cues to facilitate swallow to clear oral cavity during consumption of various consistencies including thin via cup, ice chips, puree and solids.  No overt s/s of aspiration  present, but pt is at mild risk for aspiration d/t impaired cognition.  Wife stated he will occasionally "hold food in his cheeks" during meals.  She also shared pt will have difficulty with transitioning larger pills into pharynx.  Recommend initiating a regular/thin liquid diet with general swallowing precautions and FULL supervision provided during meals for facilitating improved intake and implementing  swallowing strategies to eliminate aspiration risk.  ST will f/u x1 for diet tolerance while in acute setting.  Thank you for this consult. SLP Visit Diagnosis: Dysphagia, unspecified (R13.10)    Aspiration Risk  Mild aspiration risk    Diet Recommendation   Regular/thin liquids  Medication Administration: Whole meds with liquid (or puree for larger pills)    Other  Recommendations Oral Care Recommendations: Oral care BID;Staff/trained caregiver to provide oral care    Recommendations for follow up therapy are one component of a multi-disciplinary discharge planning process, led by the attending physician.  Recommendations may be updated based on patient status, additional functional criteria and insurance authorization.  Follow up Recommendations No SLP follow up      Assistance Recommended at Discharge Frequent or constant Supervision/Assistance  Functional Status Assessment Patient has had a recent decline in their functional status and demonstrates the ability to make significant improvements in function in a reasonable and predictable amount of time.  Frequency and Duration min 1 x/week  1 week       Prognosis Prognosis for Safe Diet Advancement: Good Barriers to Reach Goals: Cognitive deficits      Swallow Study   General Date of Onset: 05/10/22 HPI: Eric Krueger is a 76 y.o. 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 Pt NPO with BSE ordered. Type of Study: Bedside Swallow Evaluation Previous Swallow Assessment: n/a Diet Prior to this Study: NPO Temperature Spikes Noted: Yes Respiratory Status: Room air History of Recent Intubation: No Behavior/Cognition:  Alert;Confused;Requires cueing Oral Cavity Assessment: Dry Oral Care Completed by SLP: Yes Oral Cavity - Dentition: Adequate natural dentition Vision: Functional for self-feeding Self-Feeding Abilities: Able to feed self;Needs assist;Needs set up Patient Positioning: Upright in bed Baseline Vocal Quality: Low vocal intensity;Hoarse Volitional Cough: Strong Volitional Swallow: Able to elicit    Oral/Motor/Sensory Function Overall Oral Motor/Sensory Function: Within functional limits   Ice Chips Ice chips: Impaired Presentation: Spoon Oral Phase Functional Implications: Oral holding Pharyngeal Phase Impairments: Suspected delayed Swallow   Thin Liquid Thin Liquid: Impaired Presentation: Cup;Spoon Oral Phase Functional Implications: Oral holding Pharyngeal  Phase Impairments: Suspected delayed Swallow    Nectar Thick Nectar Thick Liquid: Not tested   Honey Thick Honey Thick Liquid: Not tested   Puree Puree: Impaired Presentation: Spoon Oral Phase Functional Implications: Oral holding;Prolonged oral transit Pharyngeal Phase Impairments: Suspected delayed Swallow   Solid     Solid: Impaired Presentation: Self Fed Oral Phase Functional Implications: Oral holding Pharyngeal Phase Impairments: Suspected delayed Swallow      Elvina Sidle, M.S., CCC-SLP 05/11/2022,2:38 PM

## 2022-05-11 NOTE — Progress Notes (Signed)
1 Day Post-Op Subjective: Pain controlled. No nausea or emesis. Temp curve downtrended. Tolerating foley.  Objective: Vital signs in last 24 hours: Temp:  [97.3 F (36.3 C)-102.5 F (39.2 C)] 97.3 F (36.3 C) (06/21 0357) Pulse Rate:  [59-103] 80 (06/21 0700) Resp:  [9-28] 13 (06/21 0700) BP: (71-141)/(33-96) 132/49 (06/21 0700) SpO2:  [95 %-100 %] 100 % (06/21 0700) Weight:  [78 kg] 78 kg (06/21 0204)  Intake/Output from previous day: 06/20 0701 - 06/21 0700 In: 3350 [I.V.:2000; IV Piggyback:1350] Out: 625 [Urine:625] Intake/Output this shift: Total I/O In: 224.5 [I.V.:124.6; IV Piggyback:99.9] Out: -  UOP: 677m amber in the bag  Physical Exam:  General: Alert and oriented CV: RRR Lungs: Clear Abdomen: Soft, ND, NT Ext: NT, No erythema  Lab Results: Recent Labs    05/10/22 1705 05/11/22 0249  HGB 9.8* 8.4*  HCT 31.1* 27.5*   BMET Recent Labs    05/10/22 1705 05/11/22 0249  NA 142 141  K 4.0 3.9  CL 109 112*  CO2 22 21*  GLUCOSE 105* 151*  BUN 56* 48*  CREATININE 2.39* 1.85*  CALCIUM 10.2 9.1     Studies/Results: DG C-Arm 1-60 Min-No Report  Result Date: 05/10/2022 Fluoroscopy was utilized by the requesting physician.  No radiographic interpretation.   CT ABDOMEN PELVIS WO CONTRAST  Result Date: 05/10/2022 CLINICAL DATA:  Sepsis, AMS, abdominal pain, dark cloudy urine EXAM: CT ABDOMEN AND PELVIS WITHOUT CONTRAST TECHNIQUE: Multidetector CT imaging of the abdomen and pelvis was performed following the standard protocol without IV contrast. RADIATION DOSE REDUCTION: This exam was performed according to the departmental dose-optimization program which includes automated exposure control, adjustment of the mA and/or kV according to patient size and/or use of iterative reconstruction technique. COMPARISON:  March 10, 2022 FINDINGS: Lower chest: No acute abnormality. Moderate cardiomegaly. No pericardial effusion. Hepatobiliary: No focal liver abnormality is  seen. No gallstones, gallbladder wall thickening, or biliary dilatation. Pancreas: Unremarkable. No pancreatic ductal dilatation or surrounding inflammatory changes. Spleen: Normal in size without focal abnormality. Adrenals/Urinary Tract: Both the adrenals have a normal appearance. At the right side, there is severe hydronephrosis and proximal hydroureter seen secondary to 7 mm calculus at the proximal ureter. The distal ureteral stone is dilated likely secondary to 7 mm calculus at the right UVJ. Right hydronephrosis and hydroureter is new since the previous study. At the left side, there is moderate to severe hydronephrosis and hydroureter seen with a 4 mm calculus at the left UVJ (image 76 of series 5) in comparison to 6.6 mm calculus in the previous study. Again seen is the Foley catheter in the urinary bladder. There is apparent severe thickening of the bladder wall. Stomach/Bowel: Stomach is within normal limits. Appendix appears normal. No evidence of bowel wall thickening, distention, or inflammatory changes. Large stool burden in the colon including the rectum of constipation. No bowel obstruction. Vascular/Lymphatic: Moderately severe atheromatous calcifications in the abdominal aorta extending into the iliac arteries. Reproductive: Prostate is unremarkable. Other: Bilateral inguinal hernia with a right-sided hernia containing fat with loop of the small bowel extending to the orifice of the inguinal canal. Left groin hernia contains a short segment of the sigmoid colon without complication. 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 IMPRESSION: There is severe right hydronephrosis and hydroureter seen with a 7 mm calculus at the proximal right ureter and a 7 mm calculus at the right UVJ. Obstructing hydronephrosis is new since the previous study. At the left  side, there is moderate to severe hydronephrosis and hydroureter seen with a 4 mm  calculus at the left UVJ in comparison to 6.6 mm calculus in the previous study. Foley catheter in place with apparent severe thickening of the bladder wall. 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 Large stool burden in the colon of constipation. Bilateral uncomplicated inguinal hernias. Aortic atherosclerosis. Electronically Signed   By: Frazier Richards M.D.   On: 05/10/2022 18:11   CT Head Wo Contrast  Result Date: 05/10/2022 CLINICAL DATA:  Altered mental status EXAM: CT HEAD WITHOUT CONTRAST TECHNIQUE: Contiguous axial images were obtained from the base of the skull through the vertex without intravenous contrast. RADIATION DOSE REDUCTION: This exam was performed according to the departmental dose-optimization program which includes automated exposure control, adjustment of the mA and/or kV according to patient size and/or use of iterative reconstruction technique. COMPARISON:  None Available. FINDINGS: Brain: There is no acute intracranial hemorrhage, extra-axial fluid collection, or acute infarct. Parenchymal volume is within normal limits for age. The ventricles are normal in size. Gray-white differentiation is preserved. There is no mass lesion. There is no mass effect or midline shift. 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 Vascular: There is calcification of the bilateral cavernous ICAs and vertebral arteries. Additional calcification is seen in the right sylvian fissure and over the right cerebral hemisphere which may reflect atherosclerotic plaque or calcific emboli of indeterminate age. Skull: Normal. Negative for fracture or focal lesion. Sinuses/Orbits: The imaged paranasal sinuses are clear. Bilateral lens implants are in place. The globes and orbits are otherwise unremarkable. Other: None. IMPRESSION: 1. No evidence of acute intracranial pathology. 2. Calcification in the right sylvian fissure and overlying the right cerebral  hemisphere may reflect atherosclerotic plaque or calcific emboli of indeterminate age. If there is clinical concern for infarct, consider MRI or CTA for further evaluation. Electronically Signed   By: Valetta Mole M.D.   On: 05/10/2022 17:58   DG Chest Port 1 View  Result Date: 05/10/2022 CLINICAL DATA:  Questionable sepsis. Evaluate for abnormality. Altered mental status. Dark cloudy urine. Fever. EXAM: PORTABLE CHEST 1 VIEW COMPARISON:  AP chest 07/04/2021 FINDINGS: Left chest wall cardiac AICD is again seen with leads overlying the right atrium and right ventricle. Cardiac silhouette is again moderately enlarged. Mediastinal contours are within normal limits with moderate calcification again seen within aortic arch. The lungs are clear. No pleural effusion or pneumothorax. Mild multilevel degenerative disc changes of the thoracic spine. IMPRESSION: Stable moderately enlarged cardiac silhouette. No acute lung process. Electronically Signed   By: Yvonne Kendall M.D.   On: 05/10/2022 17:08    Assessment/Plan: Castrate sensitive metastatic prostate cancer:  Urinary retention Bilateral ureteral stones s/p b/l stents by Dr. Gloriann Loan on 05/10/2022  -Pain control prn -Diet as tolerated -F/u urine cx. Continue broad spectrum abx and wean as able. -Will arrange for f/u with me and schedule outpatient b/l URS/LL for bilateral ureteral stones, possible SPT replacement   LOS: 1 day   Matt R. Jadarian Mckay MD 05/11/2022, [facility]  Pager: [phone]

## 2022-05-11 NOTE — Assessment & Plan Note (Signed)
Secondary to Urosepsis -has chronic outlet obstruction and hx of  left hydronephrosis.  Now with new right hydronephrosis and hydroureter with 2 obstructing calculi seen on CT A/P.   -Urology Dr. Gloriann Loan consulted took emergently for cystoscopy with bilateral retrograde pyelogram, bilateral ureteral stent placement. Intra-operatively he developed hypotension requiring Neo. Received a total of 3L IV NS bolus but remained hypotension with SBP in 90s with map around 55-60s. Will start on peripheral IV Levophed. If unable to wean by morning will need PCCM consult for central access.  -has received IV vancomycin, cefepime and Flagy. Will continue broad spectrum pending urine culture.

## 2022-05-11 NOTE — Assessment & Plan Note (Signed)
86 on presented and has upward trended to 120. Suspect demand due to septic shock and hypotension. -continue to trend and monitor symptoms.

## 2022-05-11 NOTE — Progress Notes (Addendum)
Pharmacy Antibiotic Note  Eric Krueger is a 76 y.o. male admitted on 05/10/2022 with sepsis due to UTI.  Patient received Vancomycin 1gm IV, Cefepime 2gm IV, and metronidazole '500mg'$  IV x 1 dose each in the ED.  Patient underwent cystoscopy with bilateral ureteral stent placement.  Pharmacy has been consulted for Cefepime and Vancomycin dosing.   Plan: Vancomycin 1500 mg IV Q 48 hrs. Goal AUC 400-550.  Expected AUC: 481.7  SCr used: 2.39 Cefepime 2gm IV q24h Follow renal function F/u culture results & sensitivities     Temp (24hrs), Avg:99.9 F (37.7 C), Min:98.1 F (36.7 C), Max:102.5 F (39.2 C)  Recent Labs  Lab 05/10/22 1705 05/10/22 2332  WBC 9.2  --   CREATININE 2.39*  --   LATICACIDVEN 1.8 1.1    CrCl cannot be calculated (Unknown ideal weight.).    Allergies  Allergen Reactions   Nsaids     Cannot take d/t defibrillator/pacemaker   Saw [place] Diarrhea   Vytorin [Ezetimibe-Simvastatin]     myalgias   Adhesive [Tape] Rash    Antimicrobials this admission: 6/20 Vancomycin >> 6/20 Metronidazole x 1 6/20 Cefepime >>  Dose adjustments this admission:   Microbiology results: 6/20 BCx:   6/20 UCx:       Thank you for allowing pharmacy to be a part of this patient's care.  Everette Rank, PharmD 05/11/2022 12:15 AM

## 2022-05-11 NOTE — Progress Notes (Signed)
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 Gloriann Loan on 05/10/2022  Subjective:  Overnight required pressors for septic shock , now off pressor this am He is sleepy, drowsy, can carry brief conversation when awake, then go back to sleep He knows he is in the hospital, he recognize his wife He denies pain He is very pale, this is new Gross hematuria in foley. Hgb 8,10mcr 1.85 May need prbc transfusion   Wife reports patient has a Chronic cough for month with pleghm, lung clear on exam, cxr no acute findings, I did not hear any cough during enconter  Wife reports he has trouble with pills last week    Assessment & Plan:  Principal Problem:   Septic shock ([place] Active Problems:   Atrial fibrillation, chronic (HCC)   Complete heart block (HCC)   Dual implantable cardioverter-defibrillator in situ   Mild cognitive impairment with memory loss   Sepsis ([place]   AKI (acute kidney injury) ([place]   Constipation   Elevated troponin   Malnutrition of moderate degree    Assessment and Plan:   * Septic shock (HCC)  from UTI obstructive stone, in the setting of chronic urinary retention -Suprapubic catheter malfunction - bilateral ureteral stones s/p b/l stents by Dr. 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   Atrial fibrillation, chronic (HCC) Complete heart block (HCC) S/p pacemaker and ICD Continue sotalol when bp stable, hold Eliquis due to gross hematuria,/ anemia  Constipation Start bowel regimen   Dysphagia? Wife reports patient has trouble with pills last week, will has speech see him  Baseline dementia/FTT From home , baseline ambulatory, oriented to person and place, not to time Will need Pt eval once medically stable    I have Reviewed nursing notes, Vitals, pain scores, I/o's, Lab results and  imaging results since pt's last encounter, details please see discussion above  I ordered the following labs:  Unresulted Labs (From admission, onward)     Start     Ordered   05/12/22 0500  Creatinine, serum  Tomorrow morning,   R       Question:  Specimen collection method  Answer:  Lab=Lab collect   05/11/22 0957   05/12/22 0500  CBC with Differential/Platelet  Tomorrow morning,   R       Question:  Specimen collection method  Answer:  Lab=Lab collect   05/11/22 1648   05/12/22 04627 Basic metabolic panel  Tomorrow morning,   R  Question:  Specimen collection method  Answer:  Lab=Lab collect   05/11/22 1648   05/12/22 0500  Magnesium  Tomorrow morning,   R       Question:  Specimen collection method  Answer:  Lab=Lab collect   05/11/22 1648   05/11/22 0120  MRSA Next Gen by PCR, Nasal  Once,   R        05/11/22 0120   05/10/22 2027  Urine Culture  RELEASE UPON ORDERING,   TIMED       Comments: Specimen A: Phone [phone]         Previous  Biopsy:  no Is the patient on airborne/droplet precautions? No           Clinical History:  N/A Copy of Report to:  N/A Specimen Disposition: Microbiology     05/10/22 2027             DVT prophylaxis: scd's, currently having gross hematuria, anemia   Code Status:   Code Status: DNR  Family Communication: Wife at bedside Disposition:   Dispo: The patient is from: Home              Anticipated d/c is to: Home with home health vs SNF              Anticipated d/c date is: TBD, remain in stepdown  Antimicrobials:   Anti-infectives (From admission, onward)    Start     Dose/Rate Route Frequency Ordered Stop   05/12/22 0600  vancomycin (VANCOREADY) IVPB 1500 mg/300 mL  Status:  Discontinued        1,500 mg 150 mL/hr over 120 Minutes Intravenous Every 48 hours 05/11/22 0218 05/11/22 0953   05/11/22 1800  vancomycin (VANCOCIN) IVPB 1000 mg/200 mL premix  Status:  Discontinued        1,000 mg 200 mL/hr over 60 Minutes Intravenous Every 24 hours 05/11/22 0953 05/11/22 1648   05/11/22 1700  ceFEPIme (MAXIPIME) 2 g in sodium chloride 0.9 % 100 mL IVPB  Status:  Discontinued        2 g 200 mL/hr over 30 Minutes Intravenous Every 24 hours 05/11/22 0021 05/11/22 0949   05/11/22 1100  ceFEPIme (MAXIPIME) 2 g in sodium chloride 0.9 % 100 mL IVPB        2 g 200 mL/hr over 30 Minutes Intravenous Every 12 hours 05/11/22 0949     05/11/22 0600  metroNIDAZOLE (FLAGYL) IVPB 500 mg        500 mg 100 mL/hr over 60 Minutes Intravenous Every 12 hours 05/11/22 0143     05/10/22 1645  ceFEPIme (MAXIPIME) 2 g in sodium chloride 0.9 % 100 mL IVPB        2 g 200 mL/hr over 30 Minutes Intravenous  Once 05/10/22 1637 05/10/22 1744   05/10/22 1645  metroNIDAZOLE (FLAGYL) IVPB 500 mg        500 mg 100 mL/hr over 60 Minutes Intravenous  Once 05/10/22 1637 05/10/22 1855   05/10/22 1645  vancomycin (VANCOCIN) IVPB 1000 mg/200 mL premix        1,000 mg 200 mL/hr over 60 Minutes Intravenous  Once  05/10/22 1637 05/10/22 1913           Objective: Vitals:   05/11/22 1445 05/11/22 1500 05/11/22 1515 05/11/22 1649  BP: (!) 105/49 (!) 109/49 104/67   Pulse: 80 81 (!) 127   Resp: (!) 21 (!) 23 (!) 22   Temp:    97.8 F (36.6  C)  TempSrc:    Oral  SpO2: 100% 99% 93%   Weight:      Height:        Intake/Output Summary (Last 24 hours) at 05/11/2022 1655 Last data filed at 05/11/2022 1306 Gross per 24 hour  Intake 3759.58 ml  Output 1025 ml  Net 2734.58 ml   Filed Weights   05/11/22 0204  Weight: 78 kg    Examination:  General exam: Weak, pale, drowsy Respiratory system: Clear to auscultation. Respiratory effort normal. Cardiovascular system: Paced rhythm Gastrointestinal system: Abdomen is nondistended, soft and nontender.  Normal bowel sounds heard. Central nervous system: Drowsy Extremities:  trace right pedal edema, dependent Skin: No rashes, lesions or ulcers Psychiatry: No agitation    Data Reviewed: I have personally reviewed  labs and visualized  imaging studies since the last encounter and formulate the plan        Scheduled Meds:  Chlorhexidine Gluconate Cloth  6 each Topical Q2200   [START ON 05/12/2022] feeding supplement  1 Container Oral Q24H   feeding supplement  237 mL Oral BID BM   multivitamin with minerals  1 tablet Oral Daily   mouth rinse  15 mL Mouth Rinse 4 times per day   [START ON 05/12/2022] polyethylene glycol  17 g Oral Daily   senna-docusate  1 tablet Oral BID   Continuous Infusions:  sodium chloride 10 mL/hr at 05/11/22 1306   ceFEPime (MAXIPIME) IV Stopped (05/11/22 1154)   metronidazole Stopped (05/11/22 0723)   norepinephrine (LEVOPHED) Adult infusion 2 mcg/min (05/11/22 1306)     LOS: 1 day      Florencia Reasons, MD PhD FACP [facility]  Available via Epic secure chat 7am-7pm for nonurgent issues Please page for urgent issues To page the attending provider between 7A-7P or the covering provider during after  hours 7P-7A, please log into the web site [email or web address] and access using universal [place] password for that web site. If you do not have the password, please call the hospital operator.    05/11/2022, 4:55 PM

## 2022-05-11 NOTE — Assessment & Plan Note (Signed)
Will need bowel regimen once BP stabilizes

## 2022-05-11 NOTE — TOC Initial Note (Signed)
Transition of Care [facility]) - Initial/Assessment Note    Patient Details  Name: Eric Krueger MRN: [id number] Date of Birth: 1945-12-14  [facility]) CM/SW Contact:    Leeroy Cha, RN Phone Number: 05/11/2022, 7:38 AM  Clinical Narrative:                  Transition of Care [facility]) Screening Note   Patient Details  Name: Eric Krueger Date of Birth: Jul 20, 1946   Transition of Care [facility] CM/SW Contact:    Leeroy Cha, RN Phone Number: 05/11/2022, 7:38 AM    Transition of Care Department [facility]) has reviewed patient and no TOC needs have been identified at this time. We will continue to monitor patient advancement through interdisciplinary progression rounds. If new patient transition needs arise, please place a TOC consult.    Expected Discharge Plan: Home/Self Care Barriers to Discharge: Continued Medical Work up   Patient Goals and CMS Choice Patient states their goals for this hospitalization and ongoing recovery are:: to go home CMS Medicare.gov Compare Post Acute Care list provided to:: Patient    Expected Discharge Plan and Services Expected Discharge Plan: Home/Self Care   Discharge Planning Services: CM Consult   Living arrangements for the past 2 months: Single Family Home                                      Prior Living Arrangements/Services Living arrangements for the past 2 months: Single Family Home Lives with:: Spouse Patient language and need for interpreter reviewed:: Yes Do you feel safe going back to the place where you live?: Yes            Criminal Activity/Legal Involvement Pertinent to Current Situation/Hospitalization: No - Comment as needed  Activities of Daily Living Home Assistive Devices/Equipment: Environmental consultant (specify type), Cane (specify quad or straight), Bedside commode/3-in-1, Shower chair with back ADL Screening (condition at time of admission) Patient's cognitive ability adequate to safely complete  daily activities?: No Is the patient deaf or have difficulty hearing?: No Does the patient have difficulty seeing, even when wearing glasses/contacts?: No Does the patient have difficulty concentrating, remembering, or making decisions?: Yes (has alzheimer's) Patient able to express need for assistance with ADLs?: No Does the patient have difficulty dressing or bathing?: Yes Independently performs ADLs?: No Communication: Independent Dressing (OT): Needs assistance Is this a change from baseline?: Pre-admission baseline Grooming: Needs assistance Is this a change from baseline?: Pre-admission baseline Feeding: Independent Bathing: Needs assistance Is this a change from baseline?: Pre-admission baseline Toileting: Needs assistance Is this a change from baseline?: Pre-admission baseline In/Out Bed: Needs assistance Is this a change from baseline?: Pre-admission baseline Walks in Home: Needs assistance Is this a change from baseline?: Pre-admission baseline Does the patient have difficulty walking or climbing stairs?: Yes Weakness of Legs: Both Weakness of Arms/Hands: Both  Permission Sought/Granted                  Emotional Assessment Appearance:: Appears stated age     Orientation: : Oriented to Self, Oriented to Place, Oriented to  Time, Oriented to Situation Alcohol / Substance Use: Not Applicable Psych Involvement: No (comment)  Admission diagnosis:  Seizure ([place]) [R56.9] Obstructive uropathy [N13.9] Sepsis with encephalopathy without septic shock, due to unspecified organism ([place]) [A41.9, R65.20, G93.40] Sepsis ([place]) [A41.9] Patient Active Problem List   Diagnosis Date Noted  Septic shock ([place]) 05/11/2022   Elevated troponin 05/11/2022   Sepsis ([place]) 05/10/2022   AKI (acute kidney injury) ([place]) 05/10/2022   Constipation 05/10/2022   BPH (benign prostatic hyperplasia) 09/17/2021   ARF (acute renal failure) ([place]) 07/04/2021   Hypothermia 07/04/2021   DDD  (degenerative disc disease), lumbar 01/01/2019   Sacroiliitis ([place]) 06/27/2018   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   History of gout 04/10/2018   Bilateral lower extremity edema 04/09/2018   Chronic pain disorder 12/22/2017   Need for hepatitis C screening test 06/29/2016   Benign non-nodular prostatic hyperplasia with lower urinary tract symptoms 06/15/2016   Abnormal glucose 11/27/2015   Dupuytren's contracture 09/12/2014   Atrial fibrillation, chronic ([place]) 06/03/2014   Mild cognitive impairment with memory loss 05/27/2014   Sigmoid diverticulosis 02/25/2014   History of pulmonary embolism 05/01/2013   Complete heart block ([place]) 05/17/2011   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   Mitral regurgitation 04/13/2011   Sick sinus syndrome ([place]) 04/13/2011   Ventricular tachycardia ([place]) 04/13/2011   Dyslipidemia 08/10/2010   PCP:  Joneen Boers, MD Pharmacy:   [facility] [place], [address] [place] [address] Phone: [phone] Fax: [phone]     Social Determinants of Health (SDOH) Interventions    Readmission Risk Interventions     No data to display

## 2022-05-11 NOTE — Plan of Care (Signed)
Discussed with patient plan of care for the evening, pain management and admisson with some teach back displayed.  Problem: Education: Goal: Knowledge of General Education information will improve Description: Including pain rating scale, medication(s)/side effects and non-pharmacologic comfort measures Outcome: Progressing

## 2022-05-11 NOTE — Progress Notes (Signed)
PHARMACY NOTE:  ANTIMICROBIAL RENAL DOSAGE ADJUSTMENT  Pharmacy consulted to dose antibiotics - see previous note by Leone [place], PharmD. Will  make the following adjustments based on improved renal function.  Current antimicrobial dosage:  Cefepime 2 g IV q24, vancomycin 1500 mg IV q48  Indication: Sepsis, likely urosepsis  Renal Function: SCr improved  Estimated Creatinine Clearance: 36.3 mL/min (A) (by C-G formula based on SCr of 1.85 mg/dL (H)). '[]'$      On intermittent HD, scheduled: '[]'$      On CRRT    Antimicrobial dosage has been changed to:   -Cefepime 2 g IV q12h -Vancomycin 1000 mg IV q24h for estimated AUC of [place], PharmD 05/11/22 9:56 AM

## 2022-05-12 ENCOUNTER — Encounter (HOSPITAL_COMMUNITY): Payer: Self-pay | Admitting: Urology

## 2022-05-12 DIAGNOSIS — I482 Chronic atrial fibrillation, unspecified: Secondary | ICD-10-CM | POA: Diagnosis not present

## 2022-05-12 DIAGNOSIS — R6521 Severe sepsis with septic shock: Secondary | ICD-10-CM | POA: Diagnosis not present

## 2022-05-12 DIAGNOSIS — A419 Sepsis, unspecified organism: Secondary | ICD-10-CM | POA: Diagnosis not present

## 2022-05-12 LAB — CBC WITH DIFFERENTIAL/PLATELET
Abs Immature Granulocytes: 0.06 10*3/uL (ref 0.00–0.07)
Basophils Absolute: 0 10*3/uL (ref 0.0–0.1)
Basophils Relative: 0 %
Eosinophils Absolute: 0.1 10*3/uL (ref 0.0–0.5)
Eosinophils Relative: 2 %
HCT: 26.5 % — ABNORMAL LOW (ref 39.0–52.0)
Hemoglobin: 8.1 g/dL — ABNORMAL LOW (ref 13.0–17.0)
Immature Granulocytes: 1 %
Lymphocytes Relative: 8 %
Lymphs Abs: 0.7 10*3/uL (ref 0.7–4.0)
MCH: 28.4 pg (ref 26.0–34.0)
MCHC: 30.6 g/dL (ref 30.0–36.0)
MCV: 93 fL (ref 80.0–100.0)
Monocytes Absolute: 0.5 10*3/uL (ref 0.1–1.0)
Monocytes Relative: 6 %
Neutro Abs: 7 10*3/uL (ref 1.7–7.7)
Neutrophils Relative %: 83 %
Platelets: 282 10*3/uL (ref 150–400)
RBC: 2.85 MIL/uL — ABNORMAL LOW (ref 4.22–5.81)
RDW: 14.9 % (ref 11.5–15.5)
WBC: 8.3 10*3/uL (ref 4.0–10.5)
nRBC: 0 % (ref 0.0–0.2)

## 2022-05-12 LAB — MAGNESIUM: Magnesium: 2 mg/dL (ref 1.7–2.4)

## 2022-05-12 LAB — BASIC METABOLIC PANEL
Anion gap: 7 (ref 5–15)
BUN: 44 mg/dL — ABNORMAL HIGH (ref 8–23)
CO2: 25 mmol/L (ref 22–32)
Calcium: 8.9 mg/dL (ref 8.9–10.3)
Chloride: 112 mmol/L — ABNORMAL HIGH (ref 98–111)
Creatinine, Ser: 1.69 mg/dL — ABNORMAL HIGH (ref 0.61–1.24)
GFR, Estimated: 41 mL/min — ABNORMAL LOW (ref >60)
Glucose, Bld: 101 mg/dL — ABNORMAL HIGH (ref 70–99)
Potassium: 3.3 mmol/L — ABNORMAL LOW (ref 3.5–5.1)
Sodium: 144 mmol/L (ref 135–145)

## 2022-05-12 LAB — CORTISOL: Cortisol, Plasma: 11.8 ug/dL

## 2022-05-12 MED ORDER — POTASSIUM CHLORIDE 20 MEQ PO PACK
40.0000 meq | PACK | Freq: Once | ORAL | Status: AC
  Administered 2022-05-12: 40 meq via ORAL
  Filled 2022-05-12: qty 2

## 2022-05-12 MED ORDER — LACTATED RINGERS IV BOLUS
1000.0000 mL | Freq: Once | INTRAVENOUS | Status: AC
Start: 2022-05-12 — End: 2022-05-12
  Administered 2022-05-12: 1000 mL via INTRAVENOUS

## 2022-05-12 MED ORDER — MUPIROCIN 2 % EX OINT
1.0000 | TOPICAL_OINTMENT | Freq: Two times a day (BID) | CUTANEOUS | Status: AC
  Administered 2022-05-12 – 2022-05-16 (×10): 1 via NASAL
  Filled 2022-05-12 (×3): qty 22

## 2022-05-12 MED ORDER — LACTATED RINGERS IV SOLN
INTRAVENOUS | Status: AC
Start: 2022-05-12 — End: 2022-05-13

## 2022-05-12 MED ORDER — HYDROCORTISONE 20 MG PO TABS
20.0000 mg | ORAL_TABLET | Freq: Two times a day (BID) | ORAL | Status: DC
  Administered 2022-05-12 – 2022-05-15 (×6): 20 mg via ORAL
  Filled 2022-05-12 (×7): qty 1

## 2022-05-12 NOTE — Progress Notes (Addendum)
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 Gloriann Loan on 05/10/2022  Subjective:  He appear stronger and fully awake, interactive, he denies pain, gross hematuria has resolved, urine is now clear in foley, however, he was put back on pressors due to hypotension again  Hgb 8.1 cr 1.69  No cough, he desires to eat Wife at bedside    Assessment & Plan:  Principal Problem:   Septic shock ([place]) Active Problems:   Atrial fibrillation, chronic (HCC)   Complete heart block (HCC)   Dual implantable cardioverter-defibrillator in situ   Mild cognitive impairment with memory loss   Sepsis ([place])   AKI (acute kidney injury) ([place])   Constipation   Elevated troponin   Malnutrition of moderate degree    Assessment and Plan:   * Septic shock (HCC)  from UTI obstructive stone, in the setting of chronic urinary retention -Suprapubic catheter malfunction, removed - bilateral ureteral stones s/p b/l stents by Dr. 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  Hypokalemia Replace K   Atrial fibrillation, chronic (HCC) Complete heart block (HCC) S/p pacemaker and ICD Continue sotalol when bp stable, hold Eliquis due to gross hematuria,/ anemia  Constipation Start bowel regimen   Dysphagia? Wife reports patient has trouble with pills last week, seen by speech , did well with speech, regular diet ordered   Baseline dementia/FTT From home , baseline ambulatory, oriented to person and place, not to time Will need Pt eval once medically stable    I have Reviewed nursing notes, Vitals, pain scores, I/o's, Lab results and  imaging results since pt's last encounter, details please see discussion above  I ordered the following labs:  Unresulted Labs (From admission, onward)     Start     Ordered   05/13/22 0500  CBC  Tomorrow morning,   R       Question:  Specimen collection method  Answer:  Lab=Lab collect   05/12/22 1443   05/13/22 0500  Comprehensive metabolic panel  Tomorrow morning,   R       Question:  Specimen collection method  Answer:  Lab=Lab collect   05/12/22 1443   05/11/22 0120  MRSA Next Gen by PCR, Nasal  Once,   R        05/11/22 0120             DVT prophylaxis: scd's, currently having gross hematuria, anemia   Code Status:   Code Status: DNR  Family Communication: Wife at bedside Disposition:   Dispo: The patient is  from: Home              Anticipated d/c is to: Home with home health vs SNF              Anticipated d/c date is: TBD, remain in stepdown  Antimicrobials:   Anti-infectives (From admission, onward)    Start     Dose/Rate Route Frequency Ordered Stop   05/12/22 0600  vancomycin (VANCOREADY) IVPB 1500 mg/300 mL  Status:  Discontinued        1,500 mg 150 mL/hr over 120 Minutes Intravenous Every 48 hours 05/11/22 0218 05/11/22 0953   05/11/22 1800  vancomycin (VANCOCIN) IVPB 1000 mg/200 mL premix   Status:  Discontinued        1,000 mg 200 mL/hr over 60 Minutes Intravenous Every 24 hours 05/11/22 0953 05/11/22 1648   05/11/22 1700  ceFEPIme (MAXIPIME) 2 g in sodium chloride 0.9 % 100 mL IVPB  Status:  Discontinued        2 g 200 mL/hr over 30 Minutes Intravenous Every 24 hours 05/11/22 0021 05/11/22 0949   05/11/22 1100  ceFEPIme (MAXIPIME) 2 g in sodium chloride 0.9 % 100 mL IVPB        2 g 200 mL/hr over 30 Minutes Intravenous Every 12 hours 05/11/22 0949     05/11/22 0600  metroNIDAZOLE (FLAGYL) IVPB 500 mg  Status:  Discontinued        500 mg 100 mL/hr over 60 Minutes Intravenous Every 12 hours 05/11/22 0143 05/12/22 1430   05/10/22 1645  ceFEPIme (MAXIPIME) 2 g in sodium chloride 0.9 % 100 mL IVPB        2 g 200 mL/hr over 30 Minutes Intravenous  Once 05/10/22 1637 05/10/22 1744   05/10/22 1645  metroNIDAZOLE (FLAGYL) IVPB 500 mg        500 mg 100 mL/hr over 60 Minutes Intravenous  Once 05/10/22 1637 05/11/22 1931   05/10/22 1645  vancomycin (VANCOCIN) IVPB 1000 mg/200 mL premix        1,000 mg 200 mL/hr over 60 Minutes Intravenous  Once 05/10/22 1637 05/12/22 0127           Objective: Vitals:   05/12/22 1815 05/12/22 1830 05/12/22 1845 05/12/22 1900  BP: (!) 103/37 (!) 111/45 (!) 115/47 (!) 113/53  Pulse: 80 78 80 75  Resp: (!) 29 (!) '23 19 19  '$ Temp:      TempSrc:      SpO2: 98% 98% 98% 97%  Weight:      Height:        Intake/Output Summary (Last 24 hours) at 05/12/2022 1938 Last data filed at 05/12/2022 1900 Gross per 24 hour  Intake 2285.81 ml  Output 2025 ml  Net 260.81 ml   Filed Weights   05/11/22 0204 05/12/22 0600  Weight: 78 kg 82.6 kg    Examination:  General exam: Appears stronger, interactive, pleasant Respiratory system: Clear to auscultation. Respiratory effort normal. Cardiovascular system: Paced rhythm Gastrointestinal system: Abdomen is nondistended, soft and nontender.  Normal bowel sounds heard. Central nervous system: Appear  baseline dementia oriented to person ,know his in the hospital Extremities:  trace right pedal edema, likely dependent edema Skin: No rashes, lesions or ulcers Psychiatry: No agitation    Data Reviewed: I have personally reviewed  labs and visualized  imaging studies since the last encounter and formulate the plan        Scheduled Meds:  Chlorhexidine Gluconate Cloth  6 each Topical Q2200  feeding supplement  1 Container Oral Q24H   feeding supplement  237 mL Oral BID BM   hydrocortisone  20 mg Oral BID   multivitamin with minerals  1 tablet Oral Daily   mupirocin ointment  1 Application Nasal BID   polyethylene glycol  17 g Oral Daily   senna-docusate  1 tablet Oral BID   Continuous Infusions:  sodium chloride 10 mL/hr at 05/12/22 1900   ceFEPime (MAXIPIME) IV Stopped (05/12/22 1024)   lactated ringers 75 mL/hr at 05/12/22 1900   norepinephrine (LEVOPHED) Adult infusion 3 mcg/min (05/12/22 1900)     LOS: 2 days      Florencia Reasons, MD PhD FACP [facility]  Available via Epic secure chat 7am-7pm for nonurgent issues Please page for urgent issues To page the attending provider between 7A-7P or the covering provider during after hours 7P-7A, please log into the web site [email or web address] and access using universal [place] password for that web site. If you do not have the password, please call the hospital operator.    05/12/2022, 7:38 PM

## 2022-05-13 ENCOUNTER — Inpatient Hospital Stay (HOSPITAL_COMMUNITY): Payer: Medicare Other

## 2022-05-13 DIAGNOSIS — R6521 Severe sepsis with septic shock: Secondary | ICD-10-CM | POA: Diagnosis not present

## 2022-05-13 DIAGNOSIS — R0609 Other forms of dyspnea: Secondary | ICD-10-CM

## 2022-05-13 DIAGNOSIS — A419 Sepsis, unspecified organism: Secondary | ICD-10-CM | POA: Diagnosis not present

## 2022-05-13 LAB — ECHOCARDIOGRAM COMPLETE
AR max vel: 3.53 cm2
AV Peak grad: 3.7 mmHg
Ao pk vel: 0.96 m/s
Area-P 1/2: 4.74 cm2
Calc EF: 63.6 %
Height: 75 in
MV M vel: 4.64 m/s
MV Peak grad: 86.1 mmHg
P 1/2 time: 413 msec
S' Lateral: 2.9 cm
Single Plane A2C EF: 61 %
Single Plane A4C EF: 64.6 %
Weight: 2913.6 oz

## 2022-05-13 LAB — URINE CULTURE: Culture: >=100000 — AB

## 2022-05-13 LAB — CBC
HCT: 27 % — ABNORMAL LOW (ref 39.0–52.0)
Hemoglobin: 8.6 g/dL — ABNORMAL LOW (ref 13.0–17.0)
MCH: 28.7 pg (ref 26.0–34.0)
MCHC: 31.9 g/dL (ref 30.0–36.0)
MCV: 90 fL (ref 80.0–100.0)
Platelets: 319 10*3/uL (ref 150–400)
RBC: 3 MIL/uL — ABNORMAL LOW (ref 4.22–5.81)
RDW: 15 % (ref 11.5–15.5)
WBC: 6.8 10*3/uL (ref 4.0–10.5)
nRBC: 0 % (ref 0.0–0.2)

## 2022-05-13 LAB — COMPREHENSIVE METABOLIC PANEL
ALT: 14 U/L (ref 0–44)
AST: 30 U/L (ref 15–41)
Albumin: 2.4 g/dL — ABNORMAL LOW (ref 3.5–5.0)
Alkaline Phosphatase: 39 U/L (ref 38–126)
Anion gap: 7 (ref 5–15)
BUN: 38 mg/dL — ABNORMAL HIGH (ref 8–23)
CO2: 22 mmol/L (ref 22–32)
Calcium: 9 mg/dL (ref 8.9–10.3)
Chloride: 113 mmol/L — ABNORMAL HIGH (ref 98–111)
Creatinine, Ser: 1.39 mg/dL — ABNORMAL HIGH (ref 0.61–1.24)
GFR, Estimated: 52 mL/min — ABNORMAL LOW (ref >60)
Glucose, Bld: 127 mg/dL — ABNORMAL HIGH (ref 70–99)
Potassium: 3.6 mmol/L (ref 3.5–5.1)
Sodium: 142 mmol/L (ref 135–145)
Total Bilirubin: 0.8 mg/dL (ref 0.3–1.2)
Total Protein: 5 g/dL — ABNORMAL LOW (ref 6.5–8.1)

## 2022-05-13 LAB — OCCULT BLOOD X 1 CARD TO LAB, STOOL: Fecal Occult Bld: POSITIVE — AB

## 2022-05-13 MED ORDER — PERFLUTREN LIPID MICROSPHERE
1.0000 mL | INTRAVENOUS | Status: AC | PRN
  Administered 2022-05-13: 2 mL via INTRAVENOUS

## 2022-05-13 MED ORDER — PANTOPRAZOLE SODIUM 40 MG IV SOLR
40.0000 mg | Freq: Two times a day (BID) | INTRAVENOUS | Status: DC
  Administered 2022-05-13 – 2022-05-17 (×10): 40 mg via INTRAVENOUS
  Filled 2022-05-13 (×10): qty 10

## 2022-05-13 MED ORDER — QUETIAPINE FUMARATE 50 MG PO TABS
25.0000 mg | ORAL_TABLET | Freq: Two times a day (BID) | ORAL | Status: DC
  Administered 2022-05-13 – 2022-05-14 (×3): 25 mg via ORAL
  Filled 2022-05-13 (×3): qty 1

## 2022-05-13 MED ORDER — QUETIAPINE FUMARATE 50 MG PO TABS: 25.0000 mg | ORAL_TABLET | Freq: Two times a day (BID) | ORAL | Status: DC

## 2022-05-13 MED ORDER — CEFAZOLIN SODIUM-DEXTROSE 2-4 GM/100ML-% IV SOLN
2.0000 g | Freq: Three times a day (TID) | INTRAVENOUS | Status: DC
  Administered 2022-05-13 – 2022-05-17 (×11): 2 g via INTRAVENOUS
  Filled 2022-05-13 (×12): qty 100

## 2022-05-13 NOTE — TOC Progression Note (Signed)
Transition of Care [facility]) - Progression Note    Patient Details  Name: Eric Krueger MRN: [id number] Date of Birth: 08/31/1946  Transition of Care [facility]) CM/SW Contact  Golda Acre, RN Phone Number: 05/13/2022, 2:16 PM  Clinical Narrative:    Physical therapy has recommended snf placement for rehab.  Fl2 sent out.  Medicare is primary.   Expected Discharge Plan: Home/Self Care Barriers to Discharge: Continued Medical Work up  Expected Discharge Plan and Services Expected Discharge Plan: Home/Self Care   Discharge Planning Services: CM Consult   Living arrangements for the past 2 months: Single Family Home                                       Social Determinants of Health (SDOH) Interventions    Readmission Risk Interventions     No data to display

## 2022-05-13 NOTE — TOC Progression Note (Signed)
Transition of Care [facility]) - Progression Note    Patient Details  Name: Eric Krueger MRN: [id number] Date of Birth: 1945-12-02  Transition of Care [facility]) CM/SW Contact  Golda Acre, RN Phone Number: 05/13/2022, 7:57 AM  Clinical Narrative:    Following for toc needs chart reviewed.   Expected Discharge Plan: Home/Self Care Barriers to Discharge: Continued Medical Work up  Expected Discharge Plan and Services Expected Discharge Plan: Home/Self Care   Discharge Planning Services: CM Consult   Living arrangements for the past 2 months: Single Family Home                                       Social Determinants of Health (SDOH) Interventions    Readmission Risk Interventions     No data to display

## 2022-05-13 NOTE — Progress Notes (Signed)
Pharmacy Antibiotic Note  Eric Krueger is a 76 y.o. male admitted on 05/10/2022 with sepsis due to bilateral ureteral stones/UTI. Patient underwent cystoscopy with bilateral ureteral stent placement and was empirically started on broad spectrum antibiotics.   Urine culture is growing proteus mirabilis (R only to nitrofurantoin). BCx remain negative thus far. Antibiotics being narrowed to cefazolin, pharmacy to dose.   Today, 05/13/22 Day #4 IV antibiotics WBC remains WNL, afebrile SCr continues to improve  Plan: Cefazolin 2 g IV q8h Monitor renal function  Height: 6\' 3"  (190.5 cm) Weight: 82.6 kg (182 lb 1.6 oz) IBW/kg (Calculated) : 84.5  Temp (24hrs), Avg:98.5 F (36.9 C), Min:97.3 F (36.3 C), Max:99.4 F (37.4 C)  Recent Labs  Lab 05/10/22 1705 05/10/22 2332 05/11/22 0249 05/12/22 0257 05/13/22 0301  WBC 9.2  --  6.2 8.3 6.8  CREATININE 2.39*  --  1.85* 1.69* 1.39*  LATICACIDVEN 1.8 1.1  --   --   --      Estimated Creatinine Clearance: 51.2 mL/min (A) (by C-G formula based on SCr of 1.39 mg/dL (H)).    Allergies  Allergen Reactions   Nsaids Other (See Comments)    Cannot take due to defibrillator/pacemaker   Saw [place] Diarrhea   Vytorin [Ezetimibe-Simvastatin] Other (See Comments)    Myalgias    Adhesive [Tape] Rash    Antimicrobials this admission: 6/20 Vancomycin x1 6/20 Metronidazole >> 6/22 6/20 Cefepime >> 6/23 6/23 cefazolin >>  Dose adjustments this admission:   Microbiology results: 6/20 BCx:  ngtd 6/20 UCx:  >100K Proteus mirabilis (R only to nitrofurantoin)    Cindi Carbon, PharmD 05/13/2022 10:16 AM

## 2022-05-14 ENCOUNTER — Inpatient Hospital Stay (HOSPITAL_COMMUNITY): Payer: Medicare Other

## 2022-05-14 DIAGNOSIS — A419 Sepsis, unspecified organism: Secondary | ICD-10-CM | POA: Diagnosis not present

## 2022-05-14 DIAGNOSIS — R6521 Severe sepsis with septic shock: Secondary | ICD-10-CM | POA: Diagnosis not present

## 2022-05-14 LAB — BASIC METABOLIC PANEL
Anion gap: 7 (ref 5–15)
BUN: 32 mg/dL — ABNORMAL HIGH (ref 8–23)
CO2: 23 mmol/L (ref 22–32)
Calcium: 8.8 mg/dL — ABNORMAL LOW (ref 8.9–10.3)
Chloride: 112 mmol/L — ABNORMAL HIGH (ref 98–111)
Creatinine, Ser: 1.34 mg/dL — ABNORMAL HIGH (ref 0.61–1.24)
GFR, Estimated: 54 mL/min — ABNORMAL LOW (ref >60)
Glucose, Bld: 133 mg/dL — ABNORMAL HIGH (ref 70–99)
Potassium: 3.3 mmol/L — ABNORMAL LOW (ref 3.5–5.1)
Sodium: 142 mmol/L (ref 135–145)

## 2022-05-14 LAB — PREPARE RBC (CROSSMATCH)

## 2022-05-14 LAB — CBC
HCT: 27.8 % — ABNORMAL LOW (ref 39.0–52.0)
Hemoglobin: 8.7 g/dL — ABNORMAL LOW (ref 13.0–17.0)
MCH: 28.4 pg (ref 26.0–34.0)
MCHC: 31.3 g/dL (ref 30.0–36.0)
MCV: 90.8 fL (ref 80.0–100.0)
Platelets: 291 10*3/uL (ref 150–400)
RBC: 3.06 MIL/uL — ABNORMAL LOW (ref 4.22–5.81)
RDW: 15.1 % (ref 11.5–15.5)
WBC: 6.6 10*3/uL (ref 4.0–10.5)
nRBC: 0 % (ref 0.0–0.2)

## 2022-05-14 LAB — ABO/RH: ABO/RH(D): A POS

## 2022-05-14 MED ORDER — SODIUM CHLORIDE 0.9% IV SOLUTION: Freq: Once | INTRAVENOUS | Status: AC

## 2022-05-14 MED ORDER — MIDODRINE HCL 2.5 MG PO TABS
2.5000 mg | ORAL_TABLET | Freq: Three times a day (TID) | ORAL | Status: DC
  Administered 2022-05-14 – 2022-05-15 (×3): 2.5 mg via ORAL
  Filled 2022-05-14 (×4): qty 1

## 2022-05-14 MED ORDER — POTASSIUM CHLORIDE 20 MEQ PO PACK
40.0000 meq | PACK | Freq: Once | ORAL | Status: AC
  Administered 2022-05-14: 40 meq via ORAL
  Filled 2022-05-14: qty 2

## 2022-05-14 MED ORDER — FOOD THICKENER (SIMPLYTHICK)
1.0000 | ORAL | Status: DC | PRN
Start: 2022-05-14 — End: 2022-05-20
  Administered 2022-05-15: 1 via ORAL

## 2022-05-14 MED ORDER — MIDODRINE HCL 5 MG PO TABS: 5.0000 mg | ORAL_TABLET | Freq: Three times a day (TID) | ORAL | Status: DC

## 2022-05-14 MED ORDER — QUETIAPINE FUMARATE 25 MG PO TABS
25.0000 mg | ORAL_TABLET | Freq: Every day | ORAL | Status: DC
  Administered 2022-05-15 – 2022-05-17 (×3): 25 mg via ORAL
  Filled 2022-05-14 (×3): qty 1

## 2022-05-14 MED ORDER — QUETIAPINE FUMARATE 25 MG PO TABS: 25.0000 mg | ORAL_TABLET | Freq: Every day | ORAL | Status: DC | PRN

## 2022-05-14 MED ORDER — FUROSEMIDE 10 MG/ML IJ SOLN
20.0000 mg | Freq: Once | INTRAMUSCULAR | Status: AC
  Administered 2022-05-14: 20 mg via INTRAVENOUS
  Filled 2022-05-14: qty 2

## 2022-05-15 DIAGNOSIS — R6521 Severe sepsis with septic shock: Secondary | ICD-10-CM | POA: Diagnosis not present

## 2022-05-15 DIAGNOSIS — A419 Sepsis, unspecified organism: Secondary | ICD-10-CM | POA: Diagnosis not present

## 2022-05-15 LAB — CBC
HCT: 33 % — ABNORMAL LOW (ref 39.0–52.0)
Hemoglobin: 10.6 g/dL — ABNORMAL LOW (ref 13.0–17.0)
MCH: 28.6 pg (ref 26.0–34.0)
MCHC: 32.1 g/dL (ref 30.0–36.0)
MCV: 89.2 fL (ref 80.0–100.0)
Platelets: 321 10*3/uL (ref 150–400)
RBC: 3.7 MIL/uL — ABNORMAL LOW (ref 4.22–5.81)
RDW: 15 % (ref 11.5–15.5)
WBC: 8.2 10*3/uL (ref 4.0–10.5)
nRBC: 0 % (ref 0.0–0.2)

## 2022-05-15 LAB — CULTURE, BLOOD (ROUTINE X 2)
Culture: NO GROWTH
Culture: NO GROWTH
Special Requests: ADEQUATE
Special Requests: ADEQUATE

## 2022-05-15 LAB — BASIC METABOLIC PANEL
Anion gap: 7 (ref 5–15)
BUN: 28 mg/dL — ABNORMAL HIGH (ref 8–23)
CO2: 25 mmol/L (ref 22–32)
Calcium: 9.1 mg/dL (ref 8.9–10.3)
Chloride: 113 mmol/L — ABNORMAL HIGH (ref 98–111)
Creatinine, Ser: 1.45 mg/dL — ABNORMAL HIGH (ref 0.61–1.24)
GFR, Estimated: 49 mL/min — ABNORMAL LOW (ref >60)
Glucose, Bld: 106 mg/dL — ABNORMAL HIGH (ref 70–99)
Potassium: 3 mmol/L — ABNORMAL LOW (ref 3.5–5.1)
Sodium: 145 mmol/L (ref 135–145)

## 2022-05-15 LAB — MAGNESIUM: Magnesium: 2 mg/dL (ref 1.7–2.4)

## 2022-05-15 MED ORDER — POTASSIUM CHLORIDE CRYS ER 20 MEQ PO TBCR
20.0000 meq | EXTENDED_RELEASE_TABLET | Freq: Once | ORAL | Status: AC
  Administered 2022-05-15: 20 meq via ORAL
  Filled 2022-05-15: qty 1

## 2022-05-15 MED ORDER — HYDROCORTISONE 10 MG PO TABS
10.0000 mg | ORAL_TABLET | Freq: Two times a day (BID) | ORAL | Status: DC
  Administered 2022-05-15 – 2022-05-16 (×3): 10 mg via ORAL
  Filled 2022-05-15 (×4): qty 1

## 2022-05-15 MED ORDER — MIDODRINE HCL 5 MG PO TABS
5.0000 mg | ORAL_TABLET | Freq: Three times a day (TID) | ORAL | Status: DC
  Administered 2022-05-15 – 2022-05-16 (×4): 5 mg via ORAL
  Filled 2022-05-15 (×5): qty 1

## 2022-05-15 MED ORDER — MUSCLE RUB 10-15 % EX CREA
TOPICAL_CREAM | CUTANEOUS | Status: DC | PRN
  Administered 2022-05-15: 1 via TOPICAL
  Filled 2022-05-15: qty 85

## 2022-05-15 MED ORDER — POTASSIUM CHLORIDE 20 MEQ PO PACK
40.0000 meq | PACK | Freq: Once | ORAL | Status: AC
  Administered 2022-05-15: 40 meq via ORAL
  Filled 2022-05-15: qty 2

## 2022-05-16 ENCOUNTER — Inpatient Hospital Stay (HOSPITAL_COMMUNITY): Payer: Medicare Other

## 2022-05-16 ENCOUNTER — Encounter (HOSPITAL_COMMUNITY): Payer: Self-pay | Admitting: Family Medicine

## 2022-05-16 ENCOUNTER — Telehealth: Payer: Self-pay | Admitting: Oncology

## 2022-05-16 DIAGNOSIS — A499 Bacterial infection, unspecified: Secondary | ICD-10-CM | POA: Diagnosis not present

## 2022-05-16 DIAGNOSIS — N179 Acute kidney failure, unspecified: Secondary | ICD-10-CM | POA: Diagnosis not present

## 2022-05-16 DIAGNOSIS — N139 Obstructive and reflux uropathy, unspecified: Secondary | ICD-10-CM

## 2022-05-16 DIAGNOSIS — E878 Other disorders of electrolyte and fluid balance, not elsewhere classified: Secondary | ICD-10-CM

## 2022-05-16 DIAGNOSIS — N390 Urinary tract infection, site not specified: Secondary | ICD-10-CM | POA: Diagnosis present

## 2022-05-16 DIAGNOSIS — I482 Chronic atrial fibrillation, unspecified: Secondary | ICD-10-CM | POA: Diagnosis not present

## 2022-05-16 DIAGNOSIS — R609 Edema, unspecified: Secondary | ICD-10-CM | POA: Diagnosis not present

## 2022-05-16 DIAGNOSIS — I442 Atrioventricular block, complete: Secondary | ICD-10-CM | POA: Diagnosis not present

## 2022-05-16 DIAGNOSIS — A419 Sepsis, unspecified organism: Secondary | ICD-10-CM | POA: Diagnosis not present

## 2022-05-16 DIAGNOSIS — R6521 Severe sepsis with septic shock: Secondary | ICD-10-CM | POA: Diagnosis not present

## 2022-05-16 DIAGNOSIS — D649 Anemia, unspecified: Secondary | ICD-10-CM

## 2022-05-16 DIAGNOSIS — G9340 Encephalopathy, unspecified: Secondary | ICD-10-CM

## 2022-05-16 DIAGNOSIS — R6520 Severe sepsis without septic shock: Secondary | ICD-10-CM

## 2022-05-16 DIAGNOSIS — C61 Malignant neoplasm of prostate: Secondary | ICD-10-CM

## 2022-05-16 DIAGNOSIS — E440 Moderate protein-calorie malnutrition: Secondary | ICD-10-CM

## 2022-05-16 LAB — CBC
HCT: 32.5 % — ABNORMAL LOW (ref 39.0–52.0)
Hemoglobin: 10.3 g/dL — ABNORMAL LOW (ref 13.0–17.0)
MCH: 28.5 pg (ref 26.0–34.0)
MCHC: 31.7 g/dL (ref 30.0–36.0)
MCV: 90 fL (ref 80.0–100.0)
Platelets: 322 10*3/uL (ref 150–400)
RBC: 3.61 MIL/uL — ABNORMAL LOW (ref 4.22–5.81)
RDW: 15.2 % (ref 11.5–15.5)
WBC: 9 10*3/uL (ref 4.0–10.5)
nRBC: 0 % (ref 0.0–0.2)

## 2022-05-16 LAB — BPAM RBC
Blood Product Expiration Date: 202307142359
ISSUE DATE / TIME: 202306241549
Unit Type and Rh: 6200

## 2022-05-16 LAB — BASIC METABOLIC PANEL
Anion gap: 8 (ref 5–15)
BUN: 35 mg/dL — ABNORMAL HIGH (ref 8–23)
CO2: 24 mmol/L (ref 22–32)
Calcium: 9.1 mg/dL (ref 8.9–10.3)
Chloride: 115 mmol/L — ABNORMAL HIGH (ref 98–111)
Creatinine, Ser: 1.38 mg/dL — ABNORMAL HIGH (ref 0.61–1.24)
GFR, Estimated: 52 mL/min — ABNORMAL LOW (ref >60)
Glucose, Bld: 105 mg/dL — ABNORMAL HIGH (ref 70–99)
Potassium: 3.2 mmol/L — ABNORMAL LOW (ref 3.5–5.1)
Sodium: 147 mmol/L — ABNORMAL HIGH (ref 135–145)

## 2022-05-16 LAB — TYPE AND SCREEN
ABO/RH(D): A POS
Antibody Screen: NEGATIVE
Unit division: 0

## 2022-05-16 MED ORDER — FENOFIBRATE 54 MG PO TABS
54.0000 mg | ORAL_TABLET | Freq: Every day | ORAL | Status: DC
  Administered 2022-05-16 – 2022-05-20 (×5): 54 mg via ORAL
  Filled 2022-05-16 (×6): qty 1

## 2022-05-16 MED ORDER — MIRTAZAPINE 30 MG PO TABS
30.0000 mg | ORAL_TABLET | Freq: Every day | ORAL | Status: DC
  Administered 2022-05-16 – 2022-05-19 (×4): 30 mg via ORAL
  Filled 2022-05-16 (×4): qty 1

## 2022-05-16 MED ORDER — MEMANTINE HCL 10 MG PO TABS
10.0000 mg | ORAL_TABLET | Freq: Two times a day (BID) | ORAL | Status: DC
  Administered 2022-05-16 – 2022-05-20 (×9): 10 mg via ORAL
  Filled 2022-05-16 (×9): qty 1

## 2022-05-16 MED ORDER — POTASSIUM CHLORIDE CRYS ER 20 MEQ PO TBCR
40.0000 meq | EXTENDED_RELEASE_TABLET | Freq: Two times a day (BID) | ORAL | Status: AC
  Administered 2022-05-16 (×2): 40 meq via ORAL
  Filled 2022-05-16 (×2): qty 2

## 2022-05-16 MED ORDER — APIXABAN 5 MG PO TABS
5.0000 mg | ORAL_TABLET | Freq: Two times a day (BID) | ORAL | Status: DC
  Administered 2022-05-16 – 2022-05-20 (×9): 5 mg via ORAL
  Filled 2022-05-16 (×9): qty 1

## 2022-05-16 MED ORDER — DEXTROSE 5 % IV SOLN: INTRAVENOUS | Status: AC

## 2022-05-16 MED ORDER — ATORVASTATIN CALCIUM 10 MG PO TABS
10.0000 mg | ORAL_TABLET | Freq: Every day | ORAL | Status: DC
  Administered 2022-05-16 – 2022-05-19 (×4): 10 mg via ORAL
  Filled 2022-05-16 (×4): qty 1

## 2022-05-16 NOTE — Telephone Encounter (Signed)
Called patient regarding upcoming September appointment, patient is notified.  

## 2022-05-16 NOTE — Progress Notes (Signed)
LUE venous duplex has been completed.  Preliminary findings given to Dr. Robb Matar.   Results can be found under chart review under CV PROC. 05/16/2022 11:23 AM Trevone Prestwood RVT, RDMS

## 2022-05-17 DIAGNOSIS — N179 Acute kidney failure, unspecified: Secondary | ICD-10-CM | POA: Diagnosis not present

## 2022-05-17 DIAGNOSIS — I442 Atrioventricular block, complete: Secondary | ICD-10-CM | POA: Diagnosis not present

## 2022-05-17 DIAGNOSIS — I482 Chronic atrial fibrillation, unspecified: Secondary | ICD-10-CM | POA: Diagnosis not present

## 2022-05-17 DIAGNOSIS — A419 Sepsis, unspecified organism: Secondary | ICD-10-CM | POA: Diagnosis not present

## 2022-05-17 MED ORDER — CEFAZOLIN SODIUM-DEXTROSE 2-4 GM/100ML-% IV SOLN: 2.0000 g | Freq: Three times a day (TID) | INTRAVENOUS | Status: DC

## 2022-05-17 MED ORDER — MIDODRINE HCL 2.5 MG PO TABS
2.5000 mg | ORAL_TABLET | Freq: Two times a day (BID) | ORAL | Status: DC
  Filled 2022-05-17: qty 1

## 2022-05-17 MED ORDER — CEPHALEXIN 500 MG PO CAPS
500.0000 mg | ORAL_CAPSULE | Freq: Three times a day (TID) | ORAL | Status: DC
Start: 2022-05-17 — End: 2022-05-20
  Administered 2022-05-17 – 2022-05-20 (×9): 500 mg via ORAL
  Filled 2022-05-17 (×9): qty 1

## 2022-05-17 NOTE — TOC Progression Note (Signed)
Transition of Care [facility]) - Progression Note    Patient Details  Name: GEORGI ORD MRN: [id number] Date of Birth: 05/13/1946  [facility]) CM/SW Contact  Nysa Sarin, Meriam Sprague, RN Phone Number: 05/17/2022, 3:36 PM  Clinical Narrative:    Spoke with pt at bedside who defers dc planning to wife. Spoke with wife via phone. Discussed physical therapy recommendations for SNF. Wife politely declines SNF and states that pt has caregivers in the home. She states that they have used [facility] for home health previously and would like to use them again for HHPT/OT. [facility] liaison contacted to resume services at dc. Will need MD orders. Wife states that they have all the DME in the home that they need.   Expected Discharge Plan: Home w Home Health Services Barriers to Discharge: Continued Medical Work up  Expected Discharge Plan and Services Expected Discharge Plan: Home w Home Health Services   Discharge Planning Services: CM Consult Post Acute Care Choice: Home Health Living arrangements for the past 2 months: Single Family Home                    HH Arranged: PT, OT [facility] Agency: [facility] Date HH Agency Contacted: 05/17/22 Time HH Agency Contacted: 1510 Representative spoke with at [facility] Agency: Morrie Sheldon   Readmission Risk Interventions    05/17/2022    2:44 PM  Readmission Risk Prevention Plan  Transportation Screening Complete  PCP or Specialist Appt within 3-5 Days Complete  HRI or Home Care Consult Complete  Social Work Consult for Recovery Care Planning/Counseling Complete  Palliative Care Screening Not Applicable  Medication Review [employer]) Complete

## 2022-05-18 DIAGNOSIS — N390 Urinary tract infection, site not specified: Secondary | ICD-10-CM | POA: Diagnosis not present

## 2022-05-18 DIAGNOSIS — A499 Bacterial infection, unspecified: Secondary | ICD-10-CM | POA: Diagnosis not present

## 2022-05-18 MED ORDER — PANTOPRAZOLE SODIUM 40 MG PO TBEC
40.0000 mg | DELAYED_RELEASE_TABLET | Freq: Two times a day (BID) | ORAL | Status: DC
  Administered 2022-05-18 – 2022-05-20 (×5): 40 mg via ORAL
  Filled 2022-05-18 (×5): qty 1

## 2022-05-18 NOTE — Progress Notes (Signed)
PHARMACIST - PHYSICIAN COMMUNICATION  DR:   Aileen Fass  CONCERNING: IV to Oral Route Change Policy  RECOMMENDATION: This patient is receiving pantoprazole by the intravenous route.  Based on criteria approved by the Pharmacy and Therapeutics Committee, the intravenous medication(s) is/are being converted to the equivalent oral dose form(s).   DESCRIPTION: These criteria include: The patient is eating (either orally or via tube) and/or has been taking other orally administered medications for a least 24 hours The patient has no evidence of active gastrointestinal bleeding or impaired GI absorption (gastrectomy, short bowel, patient on TNA or NPO).  If you have questions about this conversion, please contact the Pharmacy Department  '[]'$   [phone] )  [facility] '[]'$   [phone] )  [facility] '[]'$   [phone] )  [facility] '[]'$   [phone] )  [facility] '[x]'$   [phone] )  [place], [facility] 05/18/2022 8:22 AM

## 2022-05-18 NOTE — Progress Notes (Signed)
Physical Therapy Treatment Patient Details Name: Eric Krueger MRN: [id number] DOB: 1946/02/01 Today's Date: 05/18/2022   History of Present Illness Patient is 76 y.o. male presented to hospital from urology due to sepsis/malfunction of suprapubic catheter and was found to have Bilateral ureteral stones and is now s/p bil stents on 05/10/2022. 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    PT Comments    Pt is progressing with mobility, he ambulated 57' with RW with min/guard assist, no loss of balance. Assisted pt with BLE strengthening exercises, noted pt lacks full knee extension ROM. Pt is very pleasant but is confused 2* dementia.     Recommendations for follow up therapy are one component of a multi-disciplinary discharge planning process, led by the attending physician.  Recommendations may be updated based on patient status, additional functional criteria and insurance authorization.  Follow Up Recommendations  Skilled nursing-short term rehab (<3 hours/day) Can patient physically be transported by private vehicle: Yes   Assistance Recommended at Discharge Frequent or constant Supervision/Assistance  Patient can return home with the following A lot of help with walking and/or transfers;A lot of help with bathing/dressing/bathroom;Assistance with cooking/housework;Assistance with feeding;Direct supervision/assist for medications management;Direct supervision/assist for financial management;Assist for transportation;Help with stairs or ramp for entrance   Equipment Recommendations  None recommended by PT    Recommendations for Other Services       Precautions / Restrictions Precautions Precautions: Fall Restrictions Weight Bearing Restrictions: No     Mobility  Bed Mobility               General bed mobility comments: up  in recliner    Transfers Overall transfer level: Needs assistance Equipment used: Rolling walker (2 wheels) Transfers: Sit to/from Stand Sit to Stand: Mod assist           General transfer comment: mod A from built up recliner    Ambulation/Gait Ambulation/Gait assistance: Min guard Gait Distance (Feet): 26 Feet Assistive device: Rolling walker (2 wheels) Gait Pattern/deviations: Step-to pattern, Decreased stride length, Shuffle, Trunk flexed, Knee flexed in stance - right, Knee flexed in stance - left Gait velocity: decr     General Gait Details: no loss of balance, distance limited by fatigue, HR 80s with walking   Stairs             Wheelchair Mobility    Modified Rankin (Stroke Patients Only)       Balance Overall balance assessment: Needs assistance Sitting-balance support: Feet supported, Bilateral upper extremity supported Sitting balance-Leahy Scale: Fair     Standing balance support: Reliant on assistive device for balance, During functional activity, Bilateral upper extremity supported Standing balance-Leahy Scale: Poor                              Cognition Arousal/Alertness: Awake/alert Behavior During Therapy: WFL for tasks assessed/performed, Flat affect Overall Cognitive Status: History of cognitive impairments - at baseline                                 General Comments: pt has Alzeimer's dementia        Exercises General Exercises - Lower Extremity Ankle Circles/Pumps: AROM, Both, 10 reps, Supine Short Arc Quad: AAROM, Both, 10 reps, Supine, Limitations Short Arc Quad Limitations:  pt lacks ~15* terminal knee ext Heel Slides: AAROM, Both, 10 reps, Supine Hip Flexion/Marching: AROM, Both, 10 reps, Standing    General Comments        Pertinent Vitals/Pain Pain Assessment Pain Assessment: No/denies pain Breathing: normal Negative Vocalization: none Facial Expression: smiling or inexpressive Body  Language: relaxed Consolability: no need to console PAINAD Score: 0    Home Living                          Prior Function            PT Goals (current goals can now be found in the care plan section) Acute Rehab PT Goals Patient Stated Goal: none stated PT Goal Formulation: Patient unable to participate in goal setting Time For Goal Achievement: 05/27/22 Potential to Achieve Goals: Fair Progress towards PT goals: Progressing toward goals    Frequency    Min 2X/week      PT Plan Current plan remains appropriate    Co-evaluation              AM-PAC PT "6 Clicks" Mobility   Outcome Measure  Help needed turning from your back to your side while in a flat bed without using bedrails?: A Lot Help needed moving from lying on your back to sitting on the side of a flat bed without using bedrails?: A Lot Help needed moving to and from a bed to a chair (including a wheelchair)?: A Little Help needed standing up from a chair using your arms (e.g., wheelchair or bedside chair)?: A Lot Help needed to walk in hospital room?: A Little Help needed climbing 3-5 steps with a railing? : A Lot 6 Click Score: 14    End of Session Equipment Utilized During Treatment: Gait belt Activity Tolerance: Patient tolerated treatment well Patient left: in chair;with call bell/phone within reach;with chair alarm set Nurse Communication: Mobility status PT Visit Diagnosis: Muscle weakness (generalized) (M62.81);Difficulty in walking, not elsewhere classified (R26.2);Other abnormalities of gait and mobility (R26.89)     Time: 7001-7494 PT Time Calculation (min) (ACUTE ONLY): 18 min  Charges:  $Gait Training: 8-22 mins                    Blondell Reveal Kistler PT 05/18/2022  Acute Rehabilitation Services  Office [phone]

## 2022-05-18 NOTE — Progress Notes (Signed)
Speech Language Pathology Treatment: Dysphagia  Patient Details Name: Eric Krueger MRN: [id number] DOB: 01/14/1946 Today's Date: 05/18/2022 Time: 1545-1605 SLP Time Calculation (min) (ACUTE ONLY): 20 min  Assessment / Plan / Recommendation Clinical Impression  Pt seen for skilled SLP to determine indication for instrumental swallow evaluation and po tolerance.  Pt fully alert, sitting upright in chair and pleasant. He wishes to get back into bed but appeared comfortable after provided him with a warm blanket and turned up the room temperature to maximize his comfort.  Pt easily passed 3 ounce Yale water screen today!!!  Also self fed graham cracker with and without applesauce.  Adequate mastication and oral clearance of all boluses observed.  Pt fed self with ease and at this time suspect his swallow is at baseline.  Recommend continue diet with encouragement for self feeding. No follow up indicated and acute dysphagia has resolved.  Thanks for allowing me to help with this pt's care plan.    HPI HPI: Eric Krueger is a 76 y.o. 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 Pt aspirated breakfast in AM on 05/14/22 prompting SLP swallow evaluation.      SLP Plan  All goals met      Recommendations for follow up therapy are one component of a multi-disciplinary discharge planning process, led by the attending physician.  Recommendations may be updated based on patient status, additional functional criteria and insurance authorization.    Recommendations Regular/thin diet Diet recommendations: Dysphagia 3 (mechanical soft);Thin liquid Liquids provided via: Cup;Straw Medication Administration: Other  (Comment) (whole with Ensure) Supervision: Full supervision/cueing for compensatory strategies;Patient able to self feed Compensations: Minimize environmental distractions;Slow rate;Small sips/bites;Follow solids with liquid Postural Changes and/or Swallow Maneuvers: Seated upright 90 degrees;Upright 30-60 min after meal                Oral Care Recommendations: Oral care BID;Staff/trained caregiver to provide oral care Follow Up Recommendations: No SLP follow up Assistance recommended at discharge: Frequent or constant Supervision/Assistance SLP Visit Diagnosis: Dysphagia, unspecified (R13.10) Plan: All goals met         Tammy K, MS CCC SLP Acute Rehab Services Office [phone] Pager [phone]   ,  Ann  05/18/2022, 4:29 PM 

## 2022-05-18 NOTE — Progress Notes (Signed)
[facility] PROGRESS NOTE    Progress Note  Eric Krueger  NTI:[id number] DOB: 10/14/1946 DOA: 05/10/2022 PCP: Joneen Boers, MD     Brief Narrative:   Eric Krueger is an 76 y.o. male past medical history of dementia, atrial fibrillation complete heart block status post pacemaker and defibrillator on sotalol due to hypertension also on  Eliquis, history of metastatic prostate cancer, chronic suprapubic catheter with urinary retention is admitted for sepsis in the setting of suprapubic catheter malfunction found to have bilateral urethral stones and is status post bilateral stent placement by Dr. Gloriann Loan on 05/10/2022   Assessment/Plan:   Septic shock possibly in the setting of suprapubic catheter/with obstructive uropathy and at malfunctioning catheter: Suprapubic catheter has been removed. Urine culture grew Proteus sensitive to cefazolin he will complete a 14-day course. Blood cultures remain negative till date. Blood pressure stabilized continue to wean off steroids and midodrine. CT scan of the abdomen and pelvis showed no retroperitoneal bleed but did show bilateral pleural effusions. Physical therapy evaluated the patient and recommended skilled nursing facility which the family (wife) is refusing. She would like to take him home. She asked if the patient can stay 48 hours as she has help arrange to start helping her Saturday. We will need to follow-up with urology in 2 weeks.  Normocytic anemia: Likely due to hematuria has not been clear since 05/12/2022. Status post 1 unit packed red blood cells. Hemoglobin has remained relatively stable, Eliquis has been resumed  FOBT positive stools in the setting of constipation: [facility] was consulted they recommended conservative management. Follow-up with them as an outpatient.  New left deep vein thrombosis involving the radial artery: Eliquis was resumed for A-fib  Acute kidney injury superimposed on chronic kidney disease  stage IIIa: Secondary to bilateral obstructive uropathy/UTI. Creatinine peaked at 2.3 now has returned to baseline after stent placement  Elevated troponins: Likely demand ischemia in setting of septic shock.  Hypokalemia: Replete orally, but remains persistently low we will replete again today.   Atrial fibrillation, chronic ([place]) Status post pacemaker with an AICD in place. Can resume Eliquis.  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 Currently on androgen depriving therapy. Follow-up with the [facility] as an outpatient.  Bilateral uncomplicated inguinal hernias: Incidental finding.  Constipation: Continue bowel regimen.  Dysphagia: Speech evaluated him on 05/11/2022, dysphagia 3 diet.  Baseline dementia/failure to thrive: Intermittent agitation overnight has responded to Seroquel. Evaluated by physical therapy recommended skilled.  Left lower extremity edema: Left lower extremity Doppler negative for DVT.    DVT prophylaxis: lovenox Family Communication:none Status is: Inpatient Remains inpatient appropriate because: Remains inpatient due to septic shock. Can be transferred to telemetry.    Code Status:     Code Status Orders  (From admission, onward)           Start     Ordered   05/10/22 2338  Do not attempt resuscitation (DNR)  Continuous       Question Answer Comment  In the event of cardiac or respiratory ARREST Do not call a "code blue"   In the event of cardiac or respiratory ARREST Do not perform Intubation, CPR, defibrillation or ACLS   In the event of cardiac or respiratory ARREST Use medication by any route, position, wound care, and other measures to relive pain and suffering. May use oxygen, suction and manual treatment of airway obstruction as needed for comfort.      05/10/22 2338  Code Status History     Date Active Date Inactive Code Status Order ID Comments User Context   09/17/2021  1320 09/18/2021 1430 Full Code [id number]  Janith Lima, MD Inpatient   07/04/2021 0625 07/08/2021 1950 Full Code [id number]  Rise Patience, MD ED      Advance Directive Documentation    [place] Most Recent Value  Type of Advance Directive Out of facility DNR (pink MOST or yellow form)  Pre-existing out of facility DNR order (yellow form or pink MOST form) Physician notified to receive inpatient order  "MOST" Form in Place? --         IV Access:   Peripheral IV   Procedures and diagnostic studies:   No results found.   Medical Consultants:   None.   Subjective:    Eric Krueger no complaints today.  Objective:    Vitals:   05/17/22 0405 05/17/22 0900 05/17/22 1536 05/17/22 2011  BP: 131/79 (!) 160/116 118/73 118/72  Pulse: 81  80 80  Resp: '18  17 16  '$ Temp: 98 F (36.7 C)  97.7 F (36.5 C) 98 F (36.7 C)  TempSrc: Oral  Oral Oral  SpO2: 100%  100% 100%  Weight:      Height:       SpO2: 100 % O2 Flow Rate (L/min): 2 L/min   Intake/Output Summary (Last 24 hours) at 05/18/2022 1110 Last data filed at 05/18/2022 0622 Gross per 24 hour  Intake 0 ml  Output 1300 ml  Net -1300 ml    Filed Weights   05/11/22 0204 05/12/22 0600 05/16/22 1100  Weight: 78 kg 82.6 kg 78.1 kg    Exam: General exam: In no acute distress. Respiratory system: Good air movement and clear to auscultation. Cardiovascular system: S1 & S2 heard, RRR. No JVD. Gastrointestinal system: Abdomen is nondistended, soft and nontender.  Extremities: No pedal edema. Skin: No rashes, lesions or ulcers Psychiatry: Judgement and insight appear normal. Mood & affect appropriate.   Data Reviewed:    Labs: Basic Metabolic Panel: Recent Labs  Lab 05/12/22 0257 05/13/22 0301 05/14/22 0314 05/15/22 0246 05/16/22 0244  NA 144 142 142 145 147*  K 3.3* 3.6 3.3* 3.0* 3.2*  CL 112* 113* 112* 113* 115*  CO2 '25 22 23 25 24  '$ GLUCOSE 101* 127* 133* 106* 105*  BUN 44* 38* 32*  28* 35*  CREATININE 1.69* 1.39* 1.34* 1.45* 1.38*  CALCIUM 8.9 9.0 8.8* 9.1 9.1  MG 2.0  --   --  2.0  --     GFR Estimated Creatinine Clearance: 48.7 mL/min (A) (by C-G formula based on SCr of 1.38 mg/dL (H)). Liver Function Tests: Recent Labs  Lab 05/13/22 0301  AST 30  ALT 14  ALKPHOS 39  BILITOT 0.8  PROT 5.0*  ALBUMIN 2.4*    No results for input(s): "LIPASE", "AMYLASE" in the last 168 hours. No results for input(s): "AMMONIA" in the last 168 hours. Coagulation profile No results for input(s): "INR", "PROTIME" in the last 168 hours.  COVID-19 Labs  No results for input(s): "DDIMER", "FERRITIN", "LDH", "CRP" in the last 72 hours.  Lab Results  Component Value Date   SARSCOV2NAA NEGATIVE 05/10/2022   SARSCOV2NAA RESULT: NEGATIVE 09/14/2021   [place] NEGATIVE 07/04/2021    CBC: Recent Labs  Lab 05/12/22 0257 05/13/22 0301 05/14/22 0314 05/15/22 0246 05/16/22 0244  WBC 8.3 6.8 6.6 8.2 9.0  NEUTROABS 7.0  --   --   --   --  HGB 8.1* 8.6* 8.7* 10.6* 10.3*  HCT 26.5* 27.0* 27.8* 33.0* 32.5*  MCV 93.0 90.0 90.8 89.2 90.0  PLT 282 319 291 321 322    Cardiac Enzymes: No results for input(s): "CKTOTAL", "CKMB", "CKMBINDEX", "TROPONINI" in the last 168 hours. BNP (last 3 results) No results for input(s): "PROBNP" in the last 8760 hours. CBG: No results for input(s): "GLUCAP" in the last 168 hours.  D-Dimer: No results for input(s): "DDIMER" in the last 72 hours. Hgb A1c: No results for input(s): "HGBA1C" in the last 72 hours. Lipid Profile: No results for input(s): "CHOL", "HDL", "LDLCALC", "TRIG", "CHOLHDL", "LDLDIRECT" in the last 72 hours. Thyroid function studies: No results for input(s): "TSH", "T4TOTAL", "T3FREE", "THYROIDAB" in the last 72 hours.  Invalid input(s): "FREET3" Anemia work up: No results for input(s): "VITAMINB12", "FOLATE", "FERRITIN", "TIBC", "IRON", "RETICCTPCT" in the last 72 hours. Sepsis Labs: Recent Labs  Lab  05/13/22 0301 05/14/22 0314 05/15/22 0246 05/16/22 0244  WBC 6.8 6.6 8.2 9.0    Microbiology Recent Results (from the past 240 hour(s))  Resp Panel by RT-PCR (Flu A&B, Covid) Anterior Nasal Swab     Status: None   Collection Time: 05/10/22  5:05 PM   Specimen: Anterior Nasal Swab  Result Value Ref Range Status   SARS Coronavirus 2 by RT PCR NEGATIVE NEGATIVE Final    Comment: (NOTE) SARS-CoV-2 target nucleic acids are NOT DETECTED.  The SARS-CoV-2 RNA is generally detectable in upper respiratory specimens during the acute phase of infection. The lowest concentration of SARS-CoV-2 viral copies this assay can detect is 138 copies/mL. A negative result does not preclude SARS-Cov-2 infection and should not be used as the sole basis for treatment or other patient management decisions. A negative result may occur with  improper specimen collection/handling, submission of specimen other than nasopharyngeal swab, presence of viral mutation(s) within the areas targeted by this assay, and inadequate number of viral copies(<138 copies/mL). A negative result must be combined with clinical observations, patient history, and epidemiological information. The expected result is Negative.  Fact Sheet for Patients:  EntrepreneurPulse.com.au  Fact Sheet for Healthcare Providers:  IncredibleEmployment.be  This test is no t yet approved or cleared by the [place] FDA and  has been authorized for detection and/or diagnosis of SARS-CoV-2 by FDA under an Emergency Use Authorization (EUA). This EUA will remain  in effect (meaning this test can be used) for the duration of the COVID-19 declaration under Section 564(b)(1) of the Act, 21 U.S.C.section 360bbb-3(b)(1), unless the authorization is terminated  or revoked sooner.       Influenza A by PCR NEGATIVE NEGATIVE Final   Influenza B by PCR NEGATIVE NEGATIVE Final    Comment: (NOTE) The Xpert Xpress  SARS-CoV-2/FLU/RSV plus assay is intended as an aid in the diagnosis of influenza from Nasopharyngeal swab specimens and should not be used as a sole basis for treatment. Nasal washings and aspirates are unacceptable for Xpert Xpress SARS-CoV-2/FLU/RSV testing.  Fact Sheet for Patients: EntrepreneurPulse.com.au  Fact Sheet for Healthcare Providers: IncredibleEmployment.be  This test is not yet approved or cleared by the [place] FDA and has been authorized for detection and/or diagnosis of SARS-CoV-2 by FDA under an Emergency Use Authorization (EUA). This EUA will remain in effect (meaning this test can be used) for the duration of the COVID-19 declaration under Section 564(b)(1) of the Act, 21 U.S.C. section 360bbb-3(b)(1), unless the authorization is terminated or revoked.  Performed at [facility], [address] [place],  [place] [address]   Blood Culture (routine x 2)     Status: None   Collection Time: 05/10/22  5:05 PM   Specimen: BLOOD  Result Value Ref Range Status   Specimen Description   Final    BLOOD SITE NOT SPECIFIED Performed at [place] [address] [place], [place] [address]    Special Requests   Final    BOTTLES DRAWN AEROBIC AND ANAEROBIC Blood Culture adequate volume Performed at [place] [address], [place], [place] [address]    Culture   Final    NO GROWTH 5 DAYS Performed at [facility], [place] [address] [place], [place] [address]    Report Status 05/15/2022 FINAL  Final  Blood Culture (routine x 2)     Status: None   Collection Time: 05/10/22  5:05 PM   Specimen: BLOOD  Result Value Ref Range Status   Specimen Description   Final    BLOOD SITE NOT SPECIFIED Performed at [place] [address], [place], [place] [address]    Special Requests   Final    BOTTLES DRAWN AEROBIC AND ANAEROBIC Blood Culture  adequate volume Performed at [place] [address] [place], [place] [address]    Culture   Final    NO GROWTH 5 DAYS Performed at [facility], [place] [address], [place], [place] [address]    Report Status 05/15/2022 FINAL  Final  Urine Culture     Status: Abnormal   Collection Time: 05/10/22  5:10 PM   Specimen: In/Out Cath Urine  Result Value Ref Range Status   Specimen Description   Final    IN/OUT CATH URINE Performed at [place] [address], [place], [place] [address]    Special Requests   Final    NONE Performed at [facility], [place] [address], [place], [place] [address]    Culture >=100,000 COLONIES/mL PROTEUS MIRABILIS (A)  Final   Report Status 05/13/2022 FINAL  Final   Organism ID, Bacteria PROTEUS MIRABILIS (A)  Final      Susceptibility   Proteus mirabilis - MIC*    AMPICILLIN <=2 SENSITIVE Sensitive     CEFAZOLIN <=4 SENSITIVE Sensitive     CEFEPIME <=0.12 SENSITIVE Sensitive     CEFTRIAXONE <=0.25 SENSITIVE Sensitive     CIPROFLOXACIN <=0.25 SENSITIVE Sensitive     GENTAMICIN <=1 SENSITIVE Sensitive     IMIPENEM 2 SENSITIVE Sensitive     NITROFURANTOIN RESISTANT Resistant     TRIMETH/SULFA <=20 SENSITIVE Sensitive     AMPICILLIN/SULBACTAM <=2 SENSITIVE Sensitive     PIP/TAZO <=4 SENSITIVE Sensitive     * >=100,000 COLONIES/mL PROTEUS MIRABILIS  Urine Culture     Status: None   Collection Time: 05/10/22  7:59 PM   Specimen: Urine, Cystoscope  Result Value Ref Range Status   Specimen Description   Final    URINE, RANDOM Performed at [place] [address] [place], [place] [address]    Special Requests   Final    NONE RIGHT KIDNEY Performed at [facility], [place] [address] [place], [place] [address]    Culture   Final    NO GROWTH Performed at [facility], [place] [address] [place], [place] [address]    Report Status 05/11/2022  FINAL  Final  MRSA Next Gen by PCR, Nasal     Status: Abnormal  Collection Time: 05/11/22  1:13 AM   Specimen: Nasal Mucosa; Nasal Swab  Result Value Ref Range Status   MRSA by PCR Next Gen DETECTED (A) NOT DETECTED Final    Comment: RESULT CALLED TO, READ BACK BY AND VERIFIED WITH: SAVANNA RN ON 05/11/22 @ 0300 BY GOLSON M (NOTE) The GeneXpert MRSA Assay (FDA approved for NASAL specimens only), is one component of a comprehensive MRSA colonization surveillance program. It is not intended to diagnose MRSA infection nor to guide or monitor treatment for MRSA infections. Test performance is not FDA approved in patients less than 69 years old. Performed at [facility], [place] [address] [place], [place] [address]      Medications:    apixaban  5 mg Oral BID   atorvastatin  10 mg Oral QHS   cephALEXin  500 mg Oral Q8H   Chlorhexidine Gluconate Cloth  6 each Topical Q2200   feeding supplement  1 Container Oral Q24H   feeding supplement  237 mL Oral BID BM   fenofibrate  54 mg Oral Daily   memantine  10 mg Oral BID   mirtazapine  30 mg Oral QHS   multivitamin with minerals  1 tablet Oral Daily   pantoprazole  40 mg Oral BID   polyethylene glycol  17 g Oral Daily   QUEtiapine  25 mg Oral QHS   senna-docusate  1 tablet Oral BID   Continuous Infusions:      LOS: 8 days   Charlynne Cousins  [facility]  05/18/2022, 11:10 AM

## 2022-05-19 ENCOUNTER — Other Ambulatory Visit: Payer: Self-pay | Admitting: Urology

## 2022-05-19 DIAGNOSIS — E878 Other disorders of electrolyte and fluid balance, not elsewhere classified: Secondary | ICD-10-CM

## 2022-05-19 DIAGNOSIS — N179 Acute kidney failure, unspecified: Secondary | ICD-10-CM | POA: Diagnosis not present

## 2022-05-19 DIAGNOSIS — N390 Urinary tract infection, site not specified: Secondary | ICD-10-CM | POA: Diagnosis not present

## 2022-05-19 DIAGNOSIS — I482 Chronic atrial fibrillation, unspecified: Secondary | ICD-10-CM | POA: Diagnosis not present

## 2022-05-19 LAB — BASIC METABOLIC PANEL
Anion gap: 7 (ref 5–15)
BUN: 25 mg/dL — ABNORMAL HIGH (ref 8–23)
CO2: 22 mmol/L (ref 22–32)
Calcium: 9 mg/dL (ref 8.9–10.3)
Chloride: 111 mmol/L (ref 98–111)
Creatinine, Ser: 1.21 mg/dL (ref 0.61–1.24)
GFR, Estimated: >60 mL/min (ref >60)
Glucose, Bld: 101 mg/dL — ABNORMAL HIGH (ref 70–99)
Potassium: 3.8 mmol/L (ref 3.5–5.1)
Sodium: 140 mmol/L (ref 135–145)

## 2022-05-19 NOTE — Progress Notes (Signed)
[facility] PROGRESS NOTE    Progress Note  Eric Krueger  Eric Krueger:[id number] DOB: 10-Oct-1946 DOA: 05/10/2022 PCP: Joneen Boers, MD     Brief Narrative:   Eric Krueger is an 76 y.o. male past medical history of dementia, atrial fibrillation complete heart block status post pacemaker and defibrillator on sotalol due to hypertension also on  Eliquis, history of metastatic prostate cancer, chronic suprapubic catheter with urinary retention is admitted for sepsis in the setting of suprapubic catheter malfunction found to have bilateral urethral stones and is status post bilateral stent placement by Dr. Gloriann Loan on 05/10/2022   Assessment/Plan:   Septic shock possibly in the setting of suprapubic catheter/with obstructive uropathy and at malfunctioning catheter: Urine culture grew Proteus sensitive to cefazolin he will complete a 14-day course. Blood cultures remain negative till date. Wife would like to take him home, she relates he cannot take him home till tomorrow. We will need to follow-up with urology in 2 weeks.  Normocytic anemia: Likely due to hematuria has not been clear since 05/12/2022. Status post 1 unit packed red blood cells. Hemoglobin has remained relatively stable, Eliquis has been resumed  FOBT positive stools in the setting of constipation: [facility] was consulted they recommended conservative management. Follow-up with them as an outpatient.  New left deep vein thrombosis involving the radial artery: Eliquis was resumed for A-fib  Acute kidney injury superimposed on chronic kidney disease stage IIIa: Secondary to bilateral obstructive uropathy/UTI. Creatinine peaked at 2.3 now has returned to baseline after stent placement  Elevated troponins: Likely demand ischemia in setting of septic shock.  Hypokalemia: Replete orally recheck today.   Atrial fibrillation, chronic ([place]) Status post pacemaker with an AICD in place. Continue Eliquis.  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 Currently on androgen depriving therapy. Follow-up with the [facility] as an outpatient.  Bilateral uncomplicated inguinal hernias: Incidental finding.  Constipation: Continue bowel regimen.  Dysphagia: Speech evaluated him on 05/11/2022, has been advanced to a regular diet.  Baseline dementia/failure to thrive: Intermittent agitation overnight has responded to Seroquel. Evaluated by physical therapy recommended skilled.  Left lower extremity edema: Left lower extremity Doppler negative for DVT.    DVT prophylaxis: lovenox Family Communication:none Status is: Inpatient Remains inpatient appropriate because: Remains inpatient due to septic shock. Can be transferred to telemetry.    Code Status:     Code Status Orders  (From admission, onward)           Start     Ordered   05/10/22 2338  Do not attempt resuscitation (DNR)  Continuous       Question Answer Comment  In the event of cardiac or respiratory ARREST Do not call a "code blue"   In the event of cardiac or respiratory ARREST Do not perform Intubation, CPR, defibrillation or ACLS   In the event of cardiac or respiratory ARREST Use medication by any route, position, wound care, and other measures to relive pain and suffering. May use oxygen, suction and manual treatment of airway obstruction as needed for comfort.      05/10/22 2338           Code Status History     Date Active Date Inactive Code Status Order ID Comments User Context   09/17/2021 1320 09/18/2021 1430 Full Code [id number]  Janith Lima, MD Inpatient   07/04/2021 0625 07/08/2021 1950 Full Code [id number]  Rise Patience, MD ED  Advance Directive Documentation    Flowsheet Row Most Recent Value  Type of Advance Directive Out of facility DNR (pink MOST or yellow form)  Pre-existing out of facility DNR order (yellow form or pink MOST form) Physician notified to receive  inpatient order  "MOST" Form in Place? --         IV Access:   Peripheral IV   Procedures and diagnostic studies:   No results found.   Medical Consultants:   None.   Subjective:    [name] rating his diet no complaints.  Objective:    Vitals:   05/17/22 2011 05/18/22 1543 05/18/22 2129 05/19/22 0453  BP: 118/72 108/67 134/70 126/75  Pulse: 80 80 80 80  Resp: '16 20 14 14  '$ Temp: 98 F (36.7 C) 97.9 F (36.6 C) 98.9 F (37.2 C) 98.4 F (36.9 C)  TempSrc: Oral Oral Oral Oral  SpO2: 100% 100% 100% 98%  Weight:      Height:       SpO2: 98 % O2 Flow Rate (L/min): 2 L/min   Intake/Output Summary (Last 24 hours) at 05/19/2022 0856 Last data filed at 05/19/2022 0839 Gross per 24 hour  Intake 712 ml  Output 1350 ml  Net -638 ml    Filed Weights   05/11/22 0204 05/12/22 0600 05/16/22 1100  Weight: 78 kg 82.6 kg 78.1 kg    Exam: General exam: In no acute distress. Respiratory system: Good air movement and clear to auscultation. Cardiovascular system: S1 & S2 heard, RRR. No JVD. Gastrointestinal system: Abdomen is nondistended, soft and nontender.  Extremities: No pedal edema. Skin: No rashes, lesions or ulcers  Data Reviewed:    Labs: Basic Metabolic Panel: Recent Labs  Lab 05/13/22 0301 05/14/22 0314 05/15/22 0246 05/16/22 0244  NA 142 142 145 147*  K 3.6 3.3* 3.0* 3.2*  CL 113* 112* 113* 115*  CO2 '22 23 25 24  '$ GLUCOSE 127* 133* 106* 105*  BUN 38* 32* 28* 35*  CREATININE 1.39* 1.34* 1.45* 1.38*  CALCIUM 9.0 8.8* 9.1 9.1  MG  --   --  2.0  --     GFR Estimated Creatinine Clearance: 48.7 mL/min (A) (by C-G formula based on SCr of 1.38 mg/dL (H)). Liver Function Tests: Recent Labs  Lab 05/13/22 0301  AST 30  ALT 14  ALKPHOS 39  BILITOT 0.8  PROT 5.0*  ALBUMIN 2.4*    No results for input(s): "LIPASE", "AMYLASE" in the last 168 hours. No results for input(s): "AMMONIA" in the last 168 hours. Coagulation profile No  results for input(s): "INR", "PROTIME" in the last 168 hours.  COVID-19 Labs  No results for input(s): "DDIMER", "FERRITIN", "LDH", "CRP" in the last 72 hours.  Lab Results  Component Value Date   SARSCOV2NAA NEGATIVE 05/10/2022   SARSCOV2NAA RESULT: NEGATIVE 09/14/2021   [place] NEGATIVE 07/04/2021    CBC: Recent Labs  Lab 05/13/22 0301 05/14/22 0314 05/15/22 0246 05/16/22 0244  WBC 6.8 6.6 8.2 9.0  HGB 8.6* 8.7* 10.6* 10.3*  HCT 27.0* 27.8* 33.0* 32.5*  MCV 90.0 90.8 89.2 90.0  PLT 319 291 321 322    Cardiac Enzymes: No results for input(s): "CKTOTAL", "CKMB", "CKMBINDEX", "TROPONINI" in the last 168 hours. BNP (last 3 results) No results for input(s): "PROBNP" in the last 8760 hours. CBG: No results for input(s): "GLUCAP" in the last 168 hours.  D-Dimer: No results for input(s): "DDIMER" in the last 72 hours. Hgb A1c: No results for input(s): "HGBA1C" in  the last 72 hours. Lipid Profile: No results for input(s): "CHOL", "HDL", "LDLCALC", "TRIG", "CHOLHDL", "LDLDIRECT" in the last 72 hours. Thyroid function studies: No results for input(s): "TSH", "T4TOTAL", "T3FREE", "THYROIDAB" in the last 72 hours.  Invalid input(s): "FREET3" Anemia work up: No results for input(s): "VITAMINB12", "FOLATE", "FERRITIN", "TIBC", "IRON", "RETICCTPCT" in the last 72 hours. Sepsis Labs: Recent Labs  Lab 05/13/22 0301 05/14/22 0314 05/15/22 0246 05/16/22 0244  WBC 6.8 6.6 8.2 9.0    Microbiology Recent Results (from the past 240 hour(s))  Resp Panel by RT-PCR (Flu A&B, Covid) Anterior Nasal Swab     Status: None   Collection Time: 05/10/22  5:05 PM   Specimen: Anterior Nasal Swab  Result Value Ref Range Status   SARS Coronavirus 2 by RT PCR NEGATIVE NEGATIVE Final    Comment: (NOTE) SARS-CoV-2 target nucleic acids are NOT DETECTED.  The SARS-CoV-2 RNA is generally detectable in upper respiratory specimens during the acute phase of infection. The  lowest concentration of SARS-CoV-2 viral copies this assay can detect is 138 copies/mL. A negative result does not preclude SARS-Cov-2 infection and should not be used as the sole basis for treatment or other patient management decisions. A negative result may occur with  improper specimen collection/handling, submission of specimen other than nasopharyngeal swab, presence of viral mutation(s) within the areas targeted by this assay, and inadequate number of viral copies(<138 copies/mL). A negative result must be combined with clinical observations, patient history, and epidemiological information. The expected result is Negative.  Fact Sheet for Patients:  EntrepreneurPulse.com.au  Fact Sheet for Healthcare Providers:  IncredibleEmployment.be  This test is no t yet approved or cleared by the [place] FDA and  has been authorized for detection and/or diagnosis of SARS-CoV-2 by FDA under an Emergency Use Authorization (EUA). This EUA will remain  in effect (meaning this test can be used) for the duration of the COVID-19 declaration under Section 564(b)(1) of the Act, 21 U.S.C.section 360bbb-3(b)(1), unless the authorization is terminated  or revoked sooner.       Influenza A by PCR NEGATIVE NEGATIVE Final   Influenza B by PCR NEGATIVE NEGATIVE Final    Comment: (NOTE) The Xpert Xpress SARS-CoV-2/FLU/RSV plus assay is intended as an aid in the diagnosis of influenza from Nasopharyngeal swab specimens and should not be used as a sole basis for treatment. Nasal washings and aspirates are unacceptable for Xpert Xpress SARS-CoV-2/FLU/RSV testing.  Fact Sheet for Patients: EntrepreneurPulse.com.au  Fact Sheet for Healthcare Providers: IncredibleEmployment.be  This test is not yet approved or cleared by the [place] FDA and has been authorized for detection and/or diagnosis of SARS-CoV-2 by FDA under  an Emergency Use Authorization (EUA). This EUA will remain in effect (meaning this test can be used) for the duration of the COVID-19 declaration under Section 564(b)(1) of the Act, 21 U.S.C. section 360bbb-3(b)(1), unless the authorization is terminated or revoked.  Performed at [facility], [place] [address] [place], [place] [address]   Blood Culture (routine x 2)     Status: None   Collection Time: 05/10/22  5:05 PM   Specimen: BLOOD  Result Value Ref Range Status   Specimen Description   Final    BLOOD SITE NOT SPECIFIED Performed at [facility] [address], [place], [place] [address]    Special Requests   Final    BOTTLES DRAWN AEROBIC AND ANAEROBIC Blood Culture adequate volume Performed at [facility] [address]  [place], [place] [address]    Culture   Final    NO GROWTH 5 DAYS Performed at [facility], [place] [address] [place], [place] [address]    Report Status 05/15/2022 FINAL  Final  Blood Culture (routine x 2)     Status: None   Collection Time: 05/10/22  5:05 PM   Specimen: BLOOD  Result Value Ref Range Status   Specimen Description   Final    BLOOD SITE NOT SPECIFIED Performed at [facility] [address], [place], [place] [address]    Special Requests   Final    BOTTLES DRAWN AEROBIC AND ANAEROBIC Blood Culture adequate volume Performed at [facility] [address], [place], [place] [address]    Culture   Final    NO GROWTH 5 DAYS Performed at [facility], [place] [address] [place], [place] [address]    Report Status 05/15/2022 FINAL  Final  Urine Culture     Status: Abnormal   Collection Time: 05/10/22  5:10 PM   Specimen: In/Out Cath Urine  Result Value Ref Range Status   Specimen Description   Final    IN/OUT CATH URINE Performed at [facility] [address] [place], [place] [address]    Special Requests    Final    NONE Performed at [facility], [place] [address], [place], [place] [address]    Culture >=100,000 COLONIES/mL PROTEUS MIRABILIS (A)  Final   Report Status 05/13/2022 FINAL  Final   Organism ID, Bacteria PROTEUS MIRABILIS (A)  Final      Susceptibility   Proteus mirabilis - MIC*    AMPICILLIN <=2 SENSITIVE Sensitive     CEFAZOLIN <=4 SENSITIVE Sensitive     CEFEPIME <=0.12 SENSITIVE Sensitive     CEFTRIAXONE <=0.25 SENSITIVE Sensitive     CIPROFLOXACIN <=0.25 SENSITIVE Sensitive     GENTAMICIN <=1 SENSITIVE Sensitive     IMIPENEM 2 SENSITIVE Sensitive     NITROFURANTOIN RESISTANT Resistant     TRIMETH/SULFA <=20 SENSITIVE Sensitive     AMPICILLIN/SULBACTAM <=2 SENSITIVE Sensitive     PIP/TAZO <=4 SENSITIVE Sensitive     * >=100,000 COLONIES/mL PROTEUS MIRABILIS  Urine Culture     Status: None   Collection Time: 05/10/22  7:59 PM   Specimen: Urine, Cystoscope  Result Value Ref Range Status   Specimen Description   Final    URINE, RANDOM Performed at [facility] [address], [place], [place] [address]    Special Requests   Final    NONE RIGHT KIDNEY Performed at [facility], [place] [address] [place], [place] [address]    Culture   Final    NO GROWTH Performed at [facility], [place] [address] [place], [place] [address]    Report Status 05/11/2022 FINAL  Final  MRSA Next Gen by PCR, Nasal     Status: Abnormal   Collection Time: 05/11/22  1:13 AM   Specimen: Nasal Mucosa; Nasal Swab  Result Value Ref Range Status   MRSA by PCR Next Gen DETECTED (A) NOT DETECTED Final    Comment: RESULT CALLED TO, READ BACK BY AND VERIFIED WITH: SAVANNA RN ON 05/11/22 @ 0300 BY GOLSON M (NOTE) The GeneXpert MRSA Assay (FDA approved for NASAL specimens only), is one component of a comprehensive MRSA colonization surveillance program. It is not intended to diagnose MRSA infection nor to guide or monitor treatment for  MRSA infections. Test performance is not FDA approved in patients less than 64 years old. Performed at [facility], [place] [address], [place], [place] [address]      Medications:    apixaban  5 mg Oral BID   atorvastatin  10 mg Oral QHS   cephALEXin  500 mg Oral Q8H   Chlorhexidine Gluconate Cloth  6 each Topical Q2200   feeding supplement  1 Container Oral Q24H   feeding supplement  237 mL Oral BID BM   fenofibrate  54 mg Oral Daily   memantine  10 mg Oral BID   mirtazapine  30 mg Oral QHS   multivitamin with minerals  1 tablet Oral Daily   pantoprazole  40 mg Oral BID   polyethylene glycol  17 g Oral Daily   senna-docusate  1 tablet Oral BID   Continuous Infusions:      LOS: 9 days   Charlynne Cousins  [facility]  05/19/2022, 8:56 AM

## 2022-05-20 DIAGNOSIS — N390 Urinary tract infection, site not specified: Secondary | ICD-10-CM | POA: Diagnosis not present

## 2022-05-20 DIAGNOSIS — N139 Obstructive and reflux uropathy, unspecified: Secondary | ICD-10-CM

## 2022-05-20 DIAGNOSIS — I442 Atrioventricular block, complete: Secondary | ICD-10-CM | POA: Diagnosis not present

## 2022-05-20 DIAGNOSIS — I482 Chronic atrial fibrillation, unspecified: Secondary | ICD-10-CM | POA: Diagnosis not present

## 2022-05-20 DIAGNOSIS — N179 Acute kidney failure, unspecified: Secondary | ICD-10-CM | POA: Diagnosis not present

## 2022-05-20 MED ORDER — CEPHALEXIN 500 MG PO CAPS: 500.0000 mg | ORAL_CAPSULE | Freq: Three times a day (TID) | ORAL | 0 refills | Status: AC

## 2022-05-20 NOTE — Discharge Summary (Signed)
Physician Discharge Summary  Eric Krueger WUJ:[id number] DOB: [birth year] DOA: 05/10/2022  PCP: Joneen Boers, MD  Admit date: 05/10/2022 Discharge date: 05/20/2022  Admitted From: Home Disposition:  Home  Recommendations for Outpatient Follow-up:  Follow up with PCP in 1-2 weeks Please obtain BMP/CBC in one week   Home Health:Yes Equipment/Devices:None  Discharge Condition:Stable CODE STATUS:DNR Diet recommendation: Heart Healthy  Brief/Interim Summary: 76 y.o. male past medical history of dementia, atrial fibrillation complete heart block status post pacemaker and defibrillator on sotalol due to hypertension also on  Eliquis, history of metastatic prostate cancer, chronic suprapubic catheter with urinary retention is admitted for sepsis in the setting of suprapubic catheter malfunction found to have bilateral urethral stones and is status post bilateral stent placement by Dr. Gloriann Loan on 05/10/2022   Discharge Diagnoses:  Principal Problem:   Urinary tract bacterial infections Active Problems:   Obstructive uropathy   Electrolyte and fluid disorder   Anemia   Atrial fibrillation, chronic (HCC)   Complete heart block (HCC)   Dual implantable cardioverter-defibrillator in situ   Mild cognitive impairment with memory loss   AKI (acute kidney injury) ([place])   Constipation   Elevated troponin   Moderate protein-calorie malnutrition (HCC)   Prostate cancer ([place])  Septic shock possibly in the setting of suprapubic catheter with obstructive uropathy and malfunctioning catheter: Started empirically on antibiotics suprapubic catheter was removed. Urology was consulted who performed cystoscopy with stent placement on 05/10/2022. Urine culture grew Proteus sensitive to cefazolin. He will complete a 14-day course of antibiotic. He was initially requiring pressors which is now being weaned off. CT scan of the abdomen pelvis showed no retroperitoneal bleed showed mild pleural effusions. He  was weaned off steroids and gotten off midodrine. He will follow-up with urology as an outpatient.  Normocytic anemia: Likely due to hematuria in the setting of Eliquis, which was held. He was transfused 1 unit of packed red blood cells. His hemoglobin stabilized his Eliquis was resumed.  FOBT positive in the setting of constipation: GI was consulted recommended conservative management follow-up with them as an outpatient  Acute kidney injury superimposed on chronic kidney disease stage IIIa: Secondary to bilateral obstructive uropathy creatinine peaked at 2.3 with fluids and pressors his creatinine returned to baseline.  Elevated troponins: Likely demand ischemia in the setting of septic shock.  Hypokalemia: Replete orally now resolved.  Chronic atrial fibrillation: With a history of a pacemaker and AICD was taken off sotalol due to hypotension Eliquis was also held. He was resumed on sotalol and will resume Eliquis as an outpatient.  Upper extremity left DVT: His Eliquis was resumed and he will continue this as an outpatient.  History of prostate cancer: With node and bone metastases on prior CT we will follow-up with Dr. Alen Blew as an outpatient.  Bilateral uncomplicated inguinal hernias: Incidental finding follow-up with PCP as an outpatient.  Dysphagia: Tolerated regular diet evaluated by speech.  Baseline dementia: Was intermittently agitated overnight requiring Seroquel intermittently. Physical therapy evaluated the patient recommended skilled nursing facility family refused.  Lower extremity edema: Lower extremity Doppler was negative for DVT.    Discharge Instructions  Discharge Instructions     Diet - low sodium heart healthy   Complete by: As directed    Increase activity slowly   Complete by: As directed    No wound care   Complete by: As directed       Allergies as of 05/20/2022       Reactions  Nsaids Other (See Comments)   Cannot take due to  defibrillator/pacemaker   Saw Palmetto Diarrhea   Vytorin [ezetimibe-simvastatin] Other (See Comments)   Myalgias    Adhesive [tape] Rash        Medication List     TAKE these medications    acetaminophen 500 MG tablet Commonly known as: TYLENOL Take 500 mg by mouth every 6 (six) hours as needed.   atorvastatin 10 MG tablet Commonly known as: LIPITOR Take 10 mg by mouth at bedtime.   cephALEXin 500 MG capsule Commonly known as: KEFLEX Take 1 capsule (500 mg total) by mouth every 8 (eight) hours for 5 days.   CRANBERRY PO Take 1 tablet by mouth daily.   Eliquis 5 MG Tabs tablet Generic drug: apixaban Take 1 tablet (5 mg total) by mouth 2 (two) times daily.   fenofibrate 145 MG tablet Commonly known as: TRICOR Take 145 mg by mouth daily.   memantine 10 MG tablet Commonly known as: NAMENDA Take 10 mg by mouth 2 (two) times daily.   mirtazapine 30 MG tablet Commonly known as: REMERON Take 30 mg by mouth at bedtime.   multivitamin tablet Take 1 tablet by mouth daily.   OMEGA-3 PO Take 1 capsule by mouth daily.   sotalol 80 MG tablet Commonly known as: BETAPACE Take 80 mg by mouth 2 (two) times daily.   tamsulosin 0.4 MG Caps capsule Commonly known as: FLOMAX Take 0.4 mg by mouth daily.   Trospium Chloride 60 MG Cp24 Take 60 mg by mouth daily.        Follow-up Information     Joneen Boers, MD Follow up.   Specialty: Family Medicine Contact information: [address] [place] [address] [phone]         Janith Lima, MD Follow up.   Specialty: Urology Contact information: [place] [address] [phone]         Arta Silence, MD Follow up.   Specialty: Gastroenterology Why: for gi Bleed Contact information: [address] [place] [address] [phone]                Allergies  Allergen Reactions   Nsaids Other (See Comments)    Cannot take due to defibrillator/pacemaker    Saw Palmetto Diarrhea   Vytorin [Ezetimibe-Simvastatin] Other (See Comments)    Myalgias    Adhesive [Tape] Rash    Consultations: Urology Pulmonary critical care    Procedures/Studies: VAS [place] UPPER EXTREMITY VENOUS DUPLEX  Result Date: 05/16/2022 UPPER VENOUS STUDY  Patient Name:  Eric Krueger  Date of Exam:   05/16/2022 Medical Rec #: [id number]       Accession #:    [id number] Date of Birth: [birth year]       Patient Gender: M Patient Age:   66 years Exam Location:  [facility] Procedure:      VAS [place] UPPER EXTREMITY VENOUS DUPLEX Referring Phys: Annamaria Boots XU --------------------------------------------------------------------------------  Indications: Edema Anticoagulation: Eliquis - for Afib. Comparison Study: No previous exams Performing Technologist: Jody Hill RVT, RDMS  Examination Guidelines: A complete evaluation includes B-mode imaging, spectral Doppler, color Doppler, and power Doppler as needed of all accessible portions of each vessel. Bilateral testing is considered an integral part of a complete examination. Limited examinations for reoccurring indications may be performed as noted.  Right Findings: +----------+------------+---------+-----------+----------+-------+ RIGHT     CompressiblePhasicitySpontaneousPropertiesSummary +----------+------------+---------+-----------+----------+-------+ Subclavian    Full       Yes  Yes                      +----------+------------+---------+-----------+----------+-------+  Left Findings: +----------+------------+---------+-----------+----------+-------------------+ LEFT      CompressiblePhasicitySpontaneousProperties      Summary       +----------+------------+---------+-----------+----------+-------------------+ IJV           Full       Yes       Yes                                  +----------+------------+---------+-----------+----------+-------------------+ Subclavian    Full       Yes       Yes                                   +----------+------------+---------+-----------+----------+-------------------+ Axillary      Full       Yes       Yes                                  +----------+------------+---------+-----------+----------+-------------------+ Brachial      Full       Yes       Yes                                  +----------+------------+---------+-----------+----------+-------------------+ Radial        None       No        No               Acute one of paired +----------+------------+---------+-----------+----------+-------------------+ Ulnar         Full                                                      +----------+------------+---------+-----------+----------+-------------------+ Cephalic      None       No        No                 Acute - forearm   +----------+------------+---------+-----------+----------+-------------------+ Basilic     Partial      Yes       Yes                                  +----------+------------+---------+-----------+----------+-------------------+  Summary:  Right: No evidence of thrombosis in the subclavian.  Left: Findings consistent with acute deep vein thrombosis involving the left radial veins. Findings consistent with acute superficial vein thrombosis involving the left cephalic vein.  *See table(s) above for measurements and observations.  Diagnosing physician: Servando Snare MD Electronically signed by Servando Snare MD on 05/16/2022 at 3:16:43 PM.    Final    CT CHEST ABDOMEN PELVIS WO CONTRAST  Result Date: 05/14/2022 CLINICAL DATA:  Sepsis, metastatic prostate cancer * Tracking Code: BO * EXAM: CT CHEST, ABDOMEN AND PELVIS WITHOUT CONTRAST TECHNIQUE: Multidetector CT imaging of the chest, abdomen and pelvis was performed following the standard protocol without IV contrast. RADIATION DOSE REDUCTION: This exam was performed  according to the departmental dose-optimization program which includes automated exposure  control, adjustment of the mA and/or kV according to patient size and/or use of iterative reconstruction technique. COMPARISON:  CT abdomen pelvis, 05/10/2022, PET-CT, 10/14/2019 FINDINGS: CT CHEST FINDINGS Cardiovascular: Left chest multi lead pacer. Aortic atherosclerosis. Cardiomegaly. Three-vessel coronary artery calcifications. No pericardial effusion. Mediastinum/Nodes: No enlarged mediastinal, hilar, or axillary lymph nodes. Thyroid gland, trachea, and esophagus demonstrate no significant findings. Lungs/Pleura: Moderate bilateral pleural effusions and associated atelectasis or consolidation, new compared to prior examination. Musculoskeletal: No chest wall mass. CT ABDOMEN PELVIS FINDINGS Hepatobiliary: No solid liver abnormality is seen. No gallstones, gallbladder wall thickening, or biliary dilatation. Pancreas: Unremarkable. No pancreatic ductal dilatation or surrounding inflammatory changes. Spleen: Normal in size without significant abnormality. Adrenals/Urinary Tract: Adrenal glands are unremarkable. Interval placement of bilateral double-J ureteral stent catheters with formed pigtails in the renal pelves and urinary bladder. Resolved right-sided hydronephrosis, with persistent, although somewhat improved moderate left-sided hydronephrosis and hydroureter. Urinary bladder decompressed by Foley catheter. Stomach/Bowel: Stomach is within normal limits. Appendix appears normal. No evidence of bowel wall thickening, distention, or inflammatory changes. Vascular/Lymphatic: Aortic atherosclerosis. No enlarged abdominal or pelvic lymph nodes. Reproductive: No mass or other abnormality. Other: Small bilateral inguinal hernias, containing a single nonobstructed loop of sigmoid colon on the left and fat and fluid on the right (series 2, image 118). No ascites. Musculoskeletal: No acute osseous findings. Unchanged sclerotic osseous metastatic disease, most notably involving the bony pelvis, however also involving  scattered foci of the vertebral bodies, manubrium, and proximal left femur. IMPRESSION: 1. Moderate bilateral pleural effusions and associated atelectasis or consolidation, new compared to prior examination. 2. Interval placement of bilateral double-J ureteral stent catheters with formed pigtails in the renal pelves and urinary bladder. Resolved right-sided hydronephrosis, with persistent, although somewhat improved moderate left-sided hydronephrosis and hydroureter. 3. Unchanged sclerotic osseous metastatic disease. No noncontrast evidence of lymphadenopathy or soft tissue metastatic disease in the chest, abdomen, or pelvis. 4. Coronary artery disease. Aortic Atherosclerosis (ICD10-I70.0). Electronically Signed   By: Delanna Ahmadi M.D.   On: 05/14/2022 12:24   DG CHEST PORT 1 VIEW  Result Date: 05/14/2022 CLINICAL DATA:  Aspiration pneumonia EXAM: PORTABLE CHEST 1 VIEW COMPARISON:  May 10, 2022 FINDINGS: Stable cardiomegaly. Stable AICD device. The hila and mediastinum are normal. No pneumothorax. New hazy opacities in the lung bases. No other acute abnormalities. IMPRESSION: New bibasilar hazy opacities could represent aspiration, pneumonia, or developing pleural effusions. Recommend clinical correlation. Electronically Signed   By: Dorise Bullion III M.D.   On: 05/14/2022 11:25   ECHOCARDIOGRAM COMPLETE  Result Date: 05/13/2022    ECHOCARDIOGRAM REPORT   Patient Name:   [facility] Date of Exam: 05/13/2022 Medical Rec #:  [id number]      Height:       75.0 in Accession #:    [id number]     Weight:       182.1 lb Date of Birth:  1946-04-27      BSA:          2.109 m Patient Age:    75 years       BP:           139/59 mmHg Patient Gender: M              HR:           100 bpm. Exam Location:  Inpatient Procedure: 2D Echo, Cardiac Doppler, Color Doppler and Intracardiac  Opacification Agent Indications:    Dyspnea  History:        Patient has no prior history of Echocardiogram examinations.                  Arrythmias:Atrial Fibrillation; Risk Factors:Dyslipidemia.  Sonographer:    Jyl Heinz Referring Phys: [id number]  1. Left ventricular ejection fraction, by estimation, is 45 to 50%. The left ventricle has mildly decreased function. The left ventricle demonstrates regional wall motion abnormalities (see scoring diagram/findings for description). There is mild left ventricular hypertrophy. Left ventricular diastolic parameters are indeterminate. There is severe hypokinesis of the left ventricular, mid-apical apical segment and anterior segment.  2. Right ventricular systolic function is moderately reduced. The right ventricular size is mildly enlarged.  3. Left atrial size was severely dilated.  4. Right atrial size was moderately dilated.  5. The mitral valve is normal in structure. Mild mitral valve regurgitation.  6. Tricuspid valve regurgitation is moderate.  7. The aortic valve is normal in structure. Aortic valve regurgitation is trivial. No aortic stenosis is present. FINDINGS  Left Ventricle: Left ventricular ejection fraction, by estimation, is 45 to 50%. The left ventricle has mildly decreased function. The left ventricle demonstrates regional wall motion abnormalities. Severe hypokinesis of the left ventricular, mid-apical  apical segment and anterior segment. Definity contrast agent was given IV to delineate the left ventricular endocardial borders. The left ventricular internal cavity size was normal in size. There is mild left ventricular hypertrophy. Left ventricular diastolic parameters are indeterminate. Right Ventricle: The right ventricular size is mildly enlarged. Right vetricular wall thickness was not well visualized. Right ventricular systolic function is moderately reduced. Left Atrium: Left atrial size was severely dilated. Right Atrium: Right atrial size was moderately dilated. Pericardium: There is no evidence of pericardial effusion. Mitral Valve: The  mitral valve is normal in structure. Mild mitral valve regurgitation. Tricuspid Valve: The tricuspid valve is normal in structure. Tricuspid valve regurgitation is moderate. Aortic Valve: The aortic valve is normal in structure. Aortic valve regurgitation is trivial. Aortic regurgitation PHT measures 413 msec. No aortic stenosis is present. Aortic valve peak gradient measures 3.7 mmHg. Pulmonic Valve: The pulmonic valve was normal in structure. Pulmonic valve regurgitation is not visualized. Aorta: The aortic root and ascending aorta are structurally normal, with no evidence of dilitation. IAS/Shunts: The atrial septum is grossly normal. Additional Comments: A device lead is visualized.  LEFT VENTRICLE PLAX 2D LVIDd:         4.50 cm      Diastology LVIDs:         2.90 cm      LV e' medial:    7.07 cm/s LV PW:         1.30 cm      LV E/e' medial:  8.8 LV IVS:        1.20 cm      LV e' lateral:   10.90 cm/s LVOT diam:     2.20 cm      LV E/e' lateral: 5.7 LV SV:         60 LV SV Index:   28 LVOT Area:     3.80 cm  LV Volumes (MOD) LV vol d, MOD A2C: 155.0 ml LV vol d, MOD A4C: 132.0 ml LV vol s, MOD A2C: 60.5 ml LV vol s, MOD A4C: 46.7 ml LV SV MOD A2C:     94.5 ml LV SV MOD A4C:  132.0 ml LV SV MOD BP:      93.3 ml RIGHT VENTRICLE             IVC RV Basal diam:  3.90 cm     IVC diam: 1.70 cm RV Mid diam:    3.00 cm RV S prime:     12.60 cm/s TAPSE (M-mode): 1.5 cm LEFT ATRIUM              Index        RIGHT ATRIUM           Index LA diam:        4.50 cm  2.13 cm/m   RA Area:     24.50 cm LA Vol (A2C):   93.1 ml  44.15 ml/m  RA Volume:   66.50 ml  31.54 ml/m LA Vol (A4C):   103.0 ml 48.85 ml/m LA Biplane Vol: 99.5 ml  47.19 ml/m  AORTIC VALVE AV Area (Vmax): 3.53 cm AV Vmax:        96.20 cm/s AV Peak Grad:   3.7 mmHg LVOT Vmax:      89.40 cm/s LVOT Vmean:     65.200 cm/s LVOT VTI:       0.158 m AI PHT:         413 msec  AORTA Ao Root diam: 3.50 cm Ao Asc diam:  3.30 cm MITRAL VALVE               TRICUSPID  VALVE MV Area (PHT): 4.74 cm    TR Peak grad:   26.0 mmHg MV Decel Time: 160 msec    TR Vmax:        255.00 cm/s MR Peak grad: 86.1 mmHg MR Vmax:      464.00 cm/s  SHUNTS MV E velocity: 62.40 cm/s  Systemic VTI:  0.16 m                            Systemic Diam: 2.20 cm Mertie Moores MD Electronically signed by Mertie Moores MD Signature Date/Time: 05/13/2022/11:21:07 AM    Final    DG C-Arm 1-60 Min-No Report  Result Date: 05/10/2022 Fluoroscopy was utilized by the requesting physician.  No radiographic interpretation.   CT ABDOMEN PELVIS WO CONTRAST  Result Date: 05/10/2022 CLINICAL DATA:  Sepsis, AMS, abdominal pain, dark cloudy urine EXAM: CT ABDOMEN AND PELVIS WITHOUT CONTRAST TECHNIQUE: Multidetector CT imaging of the abdomen and pelvis was performed following the standard protocol without IV contrast. RADIATION DOSE REDUCTION: This exam was performed according to the departmental dose-optimization program which includes automated exposure control, adjustment of the mA and/or kV according to patient size and/or use of iterative reconstruction technique. COMPARISON:  March 10, 2022 FINDINGS: Lower chest: No acute abnormality. Moderate cardiomegaly. No pericardial effusion. Hepatobiliary: No focal liver abnormality is seen. No gallstones, gallbladder wall thickening, or biliary dilatation. Pancreas: Unremarkable. No pancreatic ductal dilatation or surrounding inflammatory changes. Spleen: Normal in size without focal abnormality. Adrenals/Urinary Tract: Both the adrenals have a normal appearance. At the right side, there is severe hydronephrosis and proximal hydroureter seen secondary to 7 mm calculus at the proximal ureter. The distal ureteral stone is dilated likely secondary to 7 mm calculus at the right UVJ. Right hydronephrosis and hydroureter is new since the previous study. At the left side, there is moderate to severe hydronephrosis and hydroureter seen with a 4 mm calculus at the left UVJ (image  76 of series  5) in comparison to 6.6 mm calculus in the previous study. Again seen is the Foley catheter in the urinary bladder. There is apparent severe thickening of the bladder wall. Stomach/Bowel: Stomach is within normal limits. Appendix appears normal. No evidence of bowel wall thickening, distention, or inflammatory changes. Large stool burden in the colon including the rectum of constipation. No bowel obstruction. Vascular/Lymphatic: Moderately severe atheromatous calcifications in the abdominal aorta extending into the iliac arteries. Reproductive: Prostate is unremarkable. Other: Bilateral inguinal hernia with a right-sided hernia containing fat with loop of the small bowel extending to the orifice of the inguinal canal. Left groin hernia contains a short segment of the sigmoid colon without complication. 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 IMPRESSION: There is severe right hydronephrosis and hydroureter seen with a 7 mm calculus at the proximal right ureter and a 7 mm calculus at the right UVJ. Obstructing hydronephrosis is new since the previous study. At the left side, there is moderate to severe hydronephrosis and hydroureter seen with a 4 mm calculus at the left UVJ in comparison to 6.6 mm calculus in the previous study. Foley catheter in place with apparent severe thickening of the bladder wall. 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 Large stool burden in the colon of constipation. Bilateral uncomplicated inguinal hernias. Aortic atherosclerosis. Electronically Signed   By: Frazier Richards M.D.   On: 05/10/2022 18:11   CT Head Wo Contrast  Result Date: 05/10/2022 CLINICAL DATA:  Altered mental status EXAM: CT HEAD WITHOUT CONTRAST TECHNIQUE: Contiguous axial images were obtained from the base of the skull through the vertex without intravenous contrast. RADIATION DOSE REDUCTION: This exam was performed  according to the departmental dose-optimization program which includes automated exposure control, adjustment of the mA and/or kV according to patient size and/or use of iterative reconstruction technique. COMPARISON:  None Available. FINDINGS: Brain: There is no acute intracranial hemorrhage, extra-axial fluid collection, or acute infarct. Parenchymal volume is within normal limits for age. The ventricles are normal in size. Gray-white differentiation is preserved. There is no mass lesion. There is no mass effect or midline shift. 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 Vascular: There is calcification of the bilateral cavernous ICAs and vertebral arteries. Additional calcification is seen in the right sylvian fissure and over the right cerebral hemisphere which may reflect atherosclerotic plaque or calcific emboli of indeterminate age. Skull: Normal. Negative for fracture or focal lesion. Sinuses/Orbits: The imaged paranasal sinuses are clear. Bilateral lens implants are in place. The globes and orbits are otherwise unremarkable. Other: None. IMPRESSION: 1. No evidence of acute intracranial pathology. 2. Calcification in the right sylvian fissure and overlying the right cerebral hemisphere may reflect atherosclerotic plaque or calcific emboli of indeterminate age. If there is clinical concern for infarct, consider MRI or CTA for further evaluation. Electronically Signed   By: Valetta Mole M.D.   On: 05/10/2022 17:58   DG Chest Port 1 View  Result Date: 05/10/2022 CLINICAL DATA:  Questionable sepsis. Evaluate for abnormality. Altered mental status. Dark cloudy urine. Fever. EXAM: PORTABLE CHEST 1 VIEW COMPARISON:  AP chest 07/04/2021 FINDINGS: Left chest wall cardiac AICD is again seen with leads overlying the right atrium and right ventricle. Cardiac silhouette is again moderately enlarged. Mediastinal contours are within normal limits with moderate  calcification again seen within aortic arch. The lungs are clear. No pleural effusion or pneumothorax. Mild multilevel degenerative disc changes of  the thoracic spine. IMPRESSION: Stable moderately enlarged cardiac silhouette. No acute lung process. Electronically Signed   By: Yvonne Kendall M.D.   On: 05/10/2022 17:08   (Echo, Carotid, EGD, Colonoscopy, ERCP)    Subjective: No complaints.  Discharge Exam: Vitals:   05/19/22 2146 05/20/22 0559  BP: 126/73 120/81  Pulse: 80 80  Resp: 18 16  Temp: 98.1 F (36.7 C) 97.9 F (36.6 C)  SpO2: 99% 96%   Vitals:   05/19/22 0453 05/19/22 1334 05/19/22 2146 05/20/22 0559  BP: 126/75 123/66 126/73 120/81  Pulse: 80 80 80 80  Resp: '14  18 16  '$ Temp: 98.4 F (36.9 C) 98.3 F (36.8 C) 98.1 F (36.7 C) 97.9 F (36.6 C)  TempSrc: Oral Oral Oral Oral  SpO2: 98% 99% 99% 96%  Weight:      Height:        General: Pt is alert, awake, not in acute distress Cardiovascular: RRR, S1/S2 +, no rubs, no gallops Respiratory: CTA bilaterally, no wheezing, no rhonchi Abdominal: Soft, NT, ND, bowel sounds + Extremities: no edema, no cyanosis    The results of significant diagnostics from this hospitalization (including imaging, microbiology, ancillary and laboratory) are listed below for reference.     Microbiology: Recent Results (from the past 240 hour(s))  Resp Panel by RT-PCR (Flu A&B, Covid) Anterior Nasal Swab     Status: None   Collection Time: 05/10/22  5:05 PM   Specimen: Anterior Nasal Swab  Result Value Ref Range Status   SARS Coronavirus 2 by RT PCR NEGATIVE NEGATIVE Final    Comment: (NOTE) SARS-CoV-2 target nucleic acids are NOT DETECTED.  The SARS-CoV-2 RNA is generally detectable in upper respiratory specimens during the acute phase of infection. The lowest concentration of SARS-CoV-2 viral copies this assay can detect is 138 copies/mL. A negative result does not preclude SARS-Cov-2 infection and should not be used as the sole  basis for treatment or other patient management decisions. A negative result may occur with  improper specimen collection/handling, submission of specimen other than nasopharyngeal swab, presence of viral mutation(s) within the areas targeted by this assay, and inadequate number of viral copies(<138 copies/mL). A negative result must be combined with clinical observations, patient history, and epidemiological information. The expected result is Negative.  Fact Sheet for Patients:  EntrepreneurPulse.com.au  Fact Sheet for Healthcare Providers:  IncredibleEmployment.be  This test is no t yet approved or cleared by the [place] FDA and  has been authorized for detection and/or diagnosis of SARS-CoV-2 by FDA under an Emergency Use Authorization (EUA). This EUA will remain  in effect (meaning this test can be used) for the duration of the COVID-19 declaration under Section 564(b)(1) of the Act, 21 U.S.C.section 360bbb-3(b)(1), unless the authorization is terminated  or revoked sooner.       Influenza A by PCR NEGATIVE NEGATIVE Final   Influenza B by PCR NEGATIVE NEGATIVE Final    Comment: (NOTE) The Xpert Xpress SARS-CoV-2/FLU/RSV plus assay is intended as an aid in the diagnosis of influenza from Nasopharyngeal swab specimens and should not be used as a sole basis for treatment. Nasal washings and aspirates are unacceptable for Xpert Xpress SARS-CoV-2/FLU/RSV testing.  Fact Sheet for Patients: EntrepreneurPulse.com.au  Fact Sheet for Healthcare Providers: IncredibleEmployment.be  This test is not yet approved or cleared by the [place] FDA and has been authorized for detection and/or diagnosis of SARS-CoV-2 by FDA under an Emergency Use Authorization (EUA). This EUA will remain in effect (meaning  this test can be used) for the duration of the COVID-19 declaration under Section 564(b)(1) of the Act,  21 U.S.C. section 360bbb-3(b)(1), unless the authorization is terminated or revoked.  Performed at [facility], [place] [address] [place], [place] [address]   Blood Culture (routine x 2)     Status: None   Collection Time: 05/10/22  5:05 PM   Specimen: BLOOD  Result Value Ref Range Status   Specimen Description   Final    BLOOD SITE NOT SPECIFIED Performed at [facility] [address] [place], [place] [address]    Special Requests   Final    BOTTLES DRAWN AEROBIC AND ANAEROBIC Blood Culture adequate volume Performed at [facility] [address], [place], [place] [address]    Culture   Final    NO GROWTH 5 DAYS Performed at [facility], [place] [address], [place], [place] [address]    Report Status 05/15/2022 FINAL  Final  Blood Culture (routine x 2)     Status: None   Collection Time: 05/10/22  5:05 PM   Specimen: BLOOD  Result Value Ref Range Status   Specimen Description   Final    BLOOD SITE NOT SPECIFIED Performed at [facility] [address], [place], [place] [address]    Special Requests   Final    BOTTLES DRAWN AEROBIC AND ANAEROBIC Blood Culture adequate volume Performed at [place] [address], [place], [place] [address]    Culture   Final    NO GROWTH 5 DAYS Performed at [facility], [place] [address], [place], [place] [address]    Report Status 05/15/2022 FINAL  Final  Urine Culture     Status: Abnormal   Collection Time: 05/10/22  5:10 PM   Specimen: In/Out Cath Urine  Result Value Ref Range Status   Specimen Description   Final    IN/OUT CATH URINE Performed at [facility] [address] [place] [address] [place], [place] [address]    Special Requests   Final    NONE Performed at [facility], [place] [address] [place], [place] [address]    Culture >=100,000 COLONIES/mL PROTEUS MIRABILIS (A)  Final   Report  Status 05/13/2022 FINAL  Final   Organism ID, Bacteria PROTEUS MIRABILIS (A)  Final      Susceptibility   Proteus mirabilis - MIC*    AMPICILLIN <=2 SENSITIVE Sensitive     CEFAZOLIN <=4 SENSITIVE Sensitive     CEFEPIME <=0.12 SENSITIVE Sensitive     CEFTRIAXONE <=0.25 SENSITIVE Sensitive     CIPROFLOXACIN <=0.25 SENSITIVE Sensitive     GENTAMICIN <=1 SENSITIVE Sensitive     IMIPENEM 2 SENSITIVE Sensitive     NITROFURANTOIN RESISTANT Resistant     TRIMETH/SULFA <=20 SENSITIVE Sensitive     AMPICILLIN/SULBACTAM <=2 SENSITIVE Sensitive     PIP/TAZO <=4 SENSITIVE Sensitive     * >=100,000 COLONIES/mL PROTEUS MIRABILIS  Urine Culture     Status: None   Collection Time: 05/10/22  7:59 PM   Specimen: Urine, Cystoscope  Result Value Ref Range Status   Specimen Description   Final    URINE, RANDOM Performed at [facility] [address] [place], [place] [address]    Special Requests   Final    NONE RIGHT KIDNEY Performed at [facility], [place] [address], [place], [place] [address]    Culture  Final    NO GROWTH Performed at [facility], [place] [address] [place], [place] [address]    Report Status 05/11/2022 FINAL  Final  MRSA Next Gen by PCR, Nasal     Status: Abnormal   Collection Time: 05/11/22  1:13 AM   Specimen: Nasal Mucosa; Nasal Swab  Result Value Ref Range Status   MRSA by PCR Next Gen DETECTED (A) NOT DETECTED Final    Comment: RESULT CALLED TO, READ BACK BY AND VERIFIED WITH: SAVANNA RN ON 05/11/22 @ 0300 BY GOLSON M (NOTE) The GeneXpert MRSA Assay (FDA approved for NASAL specimens only), is one component of a comprehensive MRSA colonization surveillance program. It is not intended to diagnose MRSA infection nor to guide or monitor treatment for MRSA infections. Test performance is not FDA approved in patients less than 95 years old. Performed at [facility], [place] [address], [place], [place]  [address]      Labs: BNP (last 3 results) No results for input(s): "BNP" in the last 8760 hours. Basic Metabolic Panel: Recent Labs  Lab 05/14/22 0314 05/15/22 0246 05/16/22 0244 05/19/22 1002  NA 142 145 147* 140  K 3.3* 3.0* 3.2* 3.8  CL 112* 113* 115* 111  CO2 '23 25 24 22  '$ GLUCOSE 133* 106* 105* 101*  BUN 32* 28* 35* 25*  CREATININE 1.34* 1.45* 1.38* 1.21  CALCIUM 8.8* 9.1 9.1 9.0  MG  --  2.0  --   --    Liver Function Tests: No results for input(s): "AST", "ALT", "ALKPHOS", "BILITOT", "PROT", "ALBUMIN" in the last 168 hours. No results for input(s): "LIPASE", "AMYLASE" in the last 168 hours. No results for input(s): "AMMONIA" in the last 168 hours. CBC: Recent Labs  Lab 05/14/22 0314 05/15/22 0246 05/16/22 0244  WBC 6.6 8.2 9.0  HGB 8.7* 10.6* 10.3*  HCT 27.8* 33.0* 32.5*  MCV 90.8 89.2 90.0  PLT 291 321 322   Cardiac Enzymes: No results for input(s): "CKTOTAL", "CKMB", "CKMBINDEX", "TROPONINI" in the last 168 hours. BNP: Invalid input(s): "POCBNP" CBG: No results for input(s): "GLUCAP" in the last 168 hours. D-Dimer No results for input(s): "DDIMER" in the last 72 hours. Hgb A1c No results for input(s): "HGBA1C" in the last 72 hours. Lipid Profile No results for input(s): "CHOL", "HDL", "LDLCALC", "TRIG", "CHOLHDL", "LDLDIRECT" in the last 72 hours. Thyroid function studies No results for input(s): "TSH", "T4TOTAL", "T3FREE", "THYROIDAB" in the last 72 hours.  Invalid input(s): "FREET3" Anemia work up No results for input(s): "VITAMINB12", "FOLATE", "FERRITIN", "TIBC", "IRON", "RETICCTPCT" in the last 72 hours. Urinalysis    Component Value Date/Time   COLORURINE YELLOW 05/10/2022 1710   APPEARANCEUR TURBID (A) 05/10/2022 1710   LABSPEC 1.014 05/10/2022 1710   PHURINE 7.0 05/10/2022 1710   GLUCOSEU NEGATIVE 05/10/2022 1710   HGBUR MODERATE (A) 05/10/2022 1710   BILIRUBINUR NEGATIVE 05/10/2022 1710   KETONESUR NEGATIVE 05/10/2022 1710   PROTEINUR  100 (A) 05/10/2022 1710   NITRITE NEGATIVE 05/10/2022 1710   LEUKOCYTESUR LARGE (A) 05/10/2022 1710   Sepsis Labs Recent Labs  Lab 05/14/22 0314 05/15/22 0246 05/16/22 0244  WBC 6.6 8.2 9.0   Microbiology Recent Results (from the past 240 hour(s))  Resp Panel by RT-PCR (Flu A&B, Covid) Anterior Nasal Swab     Status: None   Collection Time: 05/10/22  5:05 PM   Specimen: Anterior Nasal Swab  Result Value Ref Range Status   SARS Coronavirus 2 by RT PCR NEGATIVE NEGATIVE Final  Comment: (NOTE) SARS-CoV-2 target nucleic acids are NOT DETECTED.  The SARS-CoV-2 RNA is generally detectable in upper respiratory specimens during the acute phase of infection. The lowest concentration of SARS-CoV-2 viral copies this assay can detect is 138 copies/mL. A negative result does not preclude SARS-Cov-2 infection and should not be used as the sole basis for treatment or other patient management decisions. A negative result may occur with  improper specimen collection/handling, submission of specimen other than nasopharyngeal swab, presence of viral mutation(s) within the areas targeted by this assay, and inadequate number of viral copies(<138 copies/mL). A negative result must be combined with clinical observations, patient history, and epidemiological information. The expected result is Negative.  Fact Sheet for Patients:  EntrepreneurPulse.com.au  Fact Sheet for Healthcare Providers:  IncredibleEmployment.be  This test is no t yet approved or cleared by the [place] FDA and  has been authorized for detection and/or diagnosis of SARS-CoV-2 by FDA under an Emergency Use Authorization (EUA). This EUA will remain  in effect (meaning this test can be used) for the duration of the COVID-19 declaration under Section 564(b)(1) of the Act, 21 U.S.C.section 360bbb-3(b)(1), unless the authorization is terminated  or revoked sooner.       Influenza A  by PCR NEGATIVE NEGATIVE Final   Influenza B by PCR NEGATIVE NEGATIVE Final    Comment: (NOTE) The Xpert Xpress SARS-CoV-2/FLU/RSV plus assay is intended as an aid in the diagnosis of influenza from Nasopharyngeal swab specimens and should not be used as a sole basis for treatment. Nasal washings and aspirates are unacceptable for Xpert Xpress SARS-CoV-2/FLU/RSV testing.  Fact Sheet for Patients: EntrepreneurPulse.com.au  Fact Sheet for Healthcare Providers: IncredibleEmployment.be  This test is not yet approved or cleared by the [place] FDA and has been authorized for detection and/or diagnosis of SARS-CoV-2 by FDA under an Emergency Use Authorization (EUA). This EUA will remain in effect (meaning this test can be used) for the duration of the COVID-19 declaration under Section 564(b)(1) of the Act, 21 U.S.C. section 360bbb-3(b)(1), unless the authorization is terminated or revoked.  Performed at [facility], [place] [address], [place], [place] [address]   Blood Culture (routine x 2)     Status: None   Collection Time: 05/10/22  5:05 PM   Specimen: BLOOD  Result Value Ref Range Status   Specimen Description   Final    BLOOD SITE NOT SPECIFIED Performed at [facility] [address] [place] [address], [place], [place] [address]    Special Requests   Final    BOTTLES DRAWN AEROBIC AND ANAEROBIC Blood Culture adequate volume Performed at [facility] [address], [place], [place] [address]    Culture   Final    NO GROWTH 5 DAYS Performed at [facility], [place] [address] [place] [address] [place], [place] [address]    Report Status 05/15/2022 FINAL  Final  Blood Culture (routine x 2)     Status: None   Collection Time: 05/10/22  5:05 PM   Specimen: BLOOD  Result Value Ref Range Status   Specimen Description   Final    BLOOD SITE NOT SPECIFIED Performed at [place]  [address] [place], [place] [address]    Special Requests   Final    BOTTLES DRAWN AEROBIC AND ANAEROBIC Blood Culture adequate volume Performed at [facility] [address], [place],  [address]    Culture   Final    NO GROWTH  5 DAYS Performed at [facility], [place] [address], [place], [place] [address]    Report Status 05/15/2022 FINAL  Final  Urine Culture     Status: Abnormal   Collection Time: 05/10/22  5:10 PM   Specimen: In/Out Cath Urine  Result Value Ref Range Status   Specimen Description   Final    IN/OUT CATH URINE Performed at [facility] [address] [place], [place] [address]    Special Requests   Final    NONE Performed at [facility], [place] [address] [place], [place] [address]    Culture >=100,000 COLONIES/mL PROTEUS MIRABILIS (A)  Final   Report Status 05/13/2022 FINAL  Final   Organism ID, Bacteria PROTEUS MIRABILIS (A)  Final      Susceptibility   Proteus mirabilis - MIC*    AMPICILLIN <=2 SENSITIVE Sensitive     CEFAZOLIN <=4 SENSITIVE Sensitive     CEFEPIME <=0.12 SENSITIVE Sensitive     CEFTRIAXONE <=0.25 SENSITIVE Sensitive     CIPROFLOXACIN <=0.25 SENSITIVE Sensitive     GENTAMICIN <=1 SENSITIVE Sensitive     IMIPENEM 2 SENSITIVE Sensitive     NITROFURANTOIN RESISTANT Resistant     TRIMETH/SULFA <=20 SENSITIVE Sensitive     AMPICILLIN/SULBACTAM <=2 SENSITIVE Sensitive     PIP/TAZO <=4 SENSITIVE Sensitive     * >=100,000 COLONIES/mL PROTEUS MIRABILIS  Urine Culture     Status: None   Collection Time: 05/10/22  7:59 PM   Specimen: Urine, Cystoscope  Result Value Ref Range Status   Specimen Description   Final    URINE, RANDOM Performed at [facility] [address] [place], [place] [address]    Special Requests   Final    NONE RIGHT KIDNEY Performed at [facility], [place] [address] [place], [place] [address]    Culture   Final     NO GROWTH Performed at [facility], [place] [address], [place], [place] [address]    Report Status 05/11/2022 FINAL  Final  MRSA Next Gen by PCR, Nasal     Status: Abnormal   Collection Time: 05/11/22  1:13 AM   Specimen: Nasal Mucosa; Nasal Swab  Result Value Ref Range Status   MRSA by PCR Next Gen DETECTED (A) NOT DETECTED Final    Comment: RESULT CALLED TO, READ BACK BY AND VERIFIED WITH: SAVANNA RN ON 05/11/22 @ 0300 BY GOLSON M (NOTE) The GeneXpert MRSA Assay (FDA approved for NASAL specimens only), is one component of a comprehensive MRSA colonization surveillance program. It is not intended to diagnose MRSA infection nor to guide or monitor treatment for MRSA infections. Test performance is not FDA approved in patients less than 4 years old. Performed at [facility], [place] [address] [place], [place] [address]      SIGNED:   Charlynne Cousins, MD  [facility] 05/20/2022, 9:52 AM Pager   If 7PM-7AM, please contact night-coverage [email or web address] Password [email or web address]

## 2022-05-20 NOTE — Progress Notes (Signed)
Discharge instructions reviewed with patients wife Melinda Pottinger Utilizing teach back method, no questions at this time.  [facility] education confirmed.  Patient discharged to home.

## 2022-05-20 NOTE — Progress Notes (Signed)
Physical Therapy Treatment Patient Details Name: Eric Krueger MRN: [id number] DOB: [birth year] Today's Date: 05/20/2022   History of Present Illness Patient is 76 y.o. male presented to hospital from urology due to sepsis/malfunction of suprapubic catheter and was found to have Bilateral ureteral stones and is now s/p bil stents on 05/10/2022. 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    PT Comments    Pt is progressing toward acute PT goals with progression of ambulation distance and stair training. Pt anxious/fearful with mobility and lacking confidence in his abilities requiring repeated encouragement from wife and therapist throughout. Pt's family deciding on home vs. Previously recommended SNF. Therapist providing education to family throughout for bed mobility, transfers, and stair negotiation. Pt's wife able to demonstrate proper guarding and cues for pt with mobility. Has necessary DME at home- wife reporting has RW, small base quad cane, BSC, shower seat, and transport chair. Wife also reports that [facility] aides will be coming in for majority of day from 8am-7pm (maybe 1-2hrs with none in between). Pt has demonstrated progression toward acute PT goals and family reports they are able to provide necessary assist needed at home. Recommend return home with family assist and HHPT. Pt will benefit from continued skilled PT to increase his independence and maximize safety with mobility.       Recommendations for follow up therapy are one component of a multi-disciplinary discharge planning process, led by the attending physician.  Recommendations may be updated based on patient status, additional functional criteria and insurance authorization.  Follow Up Recommendations  Home health PT Can patient physically be transported by private vehicle: Yes    Assistance Recommended at Discharge Frequent or constant Supervision/Assistance  Patient can return home with the following A lot of help with walking and/or transfers;A lot of help with bathing/dressing/bathroom;Assistance with cooking/housework;Assistance with feeding;Direct supervision/assist for medications management;Direct supervision/assist for financial management;Assist for transportation;Help with stairs or ramp for entrance   Equipment Recommendations  None recommended by PT (pt has neccessary DME)    Recommendations for Other Services       Precautions / Restrictions Precautions Precautions: Fall Restrictions Weight Bearing Restrictions: No     Mobility  Bed Mobility Overal bed mobility: Needs Assistance Bed Mobility: Supine to Sit Rolling: Min assist         General bed mobility comments: from flat HOB. increased time and MIN A from    Transfers Overall transfer level: Needs assistance Equipment used: Rolling walker (2 wheels) Transfers: Sit to/from Stand Sit to Stand: Min guard, Mod assist           General transfer comment: STSx2; cues for anterior scoot and feet flat in prep for sit to stand, elevated surface to simulate home environment. MOD A to stand from recliner with cues for sequencing. Wife present and demonstrated proper guarding and cues for transfers to assist pt.    Ambulation/Gait Ambulation/Gait assistance: Min guard Gait Distance (Feet): 50 Feet Assistive device: Rolling walker (2 wheels) Gait Pattern/deviations: Step-to pattern, Decreased stride length, Shuffle, Trunk flexed Gait velocity: decr     General Gait Details: no LOB, increased fear/anxious about mobility lack of confidence. Encouragement provided throughout, wife present and encouraging throughout session and demonstrated proper guarding technique.   Stairs Stairs: Yes Stairs assistance: Min assist Stair Management: One rail Right, Step to pattern, Forwards, With  cane Number of Stairs: 5 General  stair comments: Pt hesitant about initiation of stairs, encouragement provided and cues for sequencing with use of cane. no LOB or knee buckling observed.   Wheelchair Mobility    Modified Rankin (Stroke Patients Only)       Balance Overall balance assessment: Needs assistance Sitting-balance support: Feet supported, Single extremity supported Sitting balance-Leahy Scale: Good     Standing balance support: Bilateral upper extremity supported, During functional activity Standing balance-Leahy Scale: Fair Standing balance comment: able to stand statically without AD                            Cognition Arousal/Alertness: Awake/alert Behavior During Therapy: WFL for tasks assessed/performed, Flat affect Overall Cognitive Status: History of cognitive impairments - at baseline                                 General Comments: pt has Alzeimer's dementia        Exercises      General Comments        Pertinent Vitals/Pain Pain Assessment Pain Assessment: Faces Faces Pain Scale: Hurts a little bit Pain Location: Lt shoulder Pain Descriptors / Indicators: Sore Pain Intervention(s): Monitored during session    Home Living                          Prior Function            PT Goals (current goals can now be found in the care plan section) Acute Rehab PT Goals Patient Stated Goal: none stated- family wants home PT Goal Formulation: Patient unable to participate in goal setting Time For Goal Achievement: 05/27/22 Potential to Achieve Goals: Fair Progress towards PT goals: Progressing toward goals    Frequency    Min 3X/week      PT Plan Discharge plan needs to be updated    Co-evaluation              AM-PAC PT "6 Clicks" Mobility   Outcome Measure  Help needed turning from your back to your side while in a flat bed without using bedrails?: A Little Help needed moving from lying  on your back to sitting on the side of a flat bed without using bedrails?: A Little Help needed moving to and from a bed to a chair (including a wheelchair)?: A Little Help needed standing up from a chair using your arms (e.g., wheelchair or bedside chair)?: A Lot Help needed to walk in hospital room?: A Little Help needed climbing 3-5 steps with a railing? : A Little 6 Click Score: 17    End of Session Equipment Utilized During Treatment: Gait belt Activity Tolerance: Patient tolerated treatment well Patient left: in chair;with call bell/phone within reach;with chair alarm set;with family/visitor present Nurse Communication: Mobility status PT Visit Diagnosis: Other abnormalities of gait and mobility (R26.89);Muscle weakness (generalized) (M62.81)     Time: 2376-2831 PT Time Calculation (min) (ACUTE ONLY): 38 min  Charges:  $Therapeutic Activity: 38-52 mins                    Festus Barren PT, DPT  Acute Rehabilitation Services  Office [phone]  05/20/2022, 12:05 PM

## 2022-06-15 ENCOUNTER — Emergency Department (HOSPITAL_COMMUNITY): Payer: Medicare Other

## 2022-06-15 ENCOUNTER — Inpatient Hospital Stay (HOSPITAL_COMMUNITY)
Admission: EM | Admit: 2022-06-15 | Discharge: 2022-06-18 | DRG: 682 | Disposition: A | Discharge status: Home Health | Payer: Medicare Other | Attending: Internal Medicine | Admitting: Internal Medicine

## 2022-06-15 ENCOUNTER — Other Ambulatory Visit: Payer: Self-pay

## 2022-06-15 ENCOUNTER — Encounter (HOSPITAL_COMMUNITY): Payer: Self-pay | Admitting: Internal Medicine

## 2022-06-15 DIAGNOSIS — Z886 Allergy status to analgesic agent status: Secondary | ICD-10-CM

## 2022-06-15 DIAGNOSIS — E785 Hyperlipidemia, unspecified: Secondary | ICD-10-CM | POA: Diagnosis present

## 2022-06-15 DIAGNOSIS — E43 Unspecified severe protein-calorie malnutrition: Secondary | ICD-10-CM | POA: Diagnosis present

## 2022-06-15 DIAGNOSIS — F02A0 Dementia in other diseases classified elsewhere, mild, without behavioral disturbance, psychotic disturbance, mood disturbance, and anxiety: Secondary | ICD-10-CM | POA: Diagnosis present

## 2022-06-15 DIAGNOSIS — E870 Hyperosmolality and hypernatremia: Secondary | ICD-10-CM | POA: Diagnosis present

## 2022-06-15 DIAGNOSIS — Z91048 Other nonmedicinal substance allergy status: Secondary | ICD-10-CM

## 2022-06-15 DIAGNOSIS — N1831 Chronic kidney disease, stage 3a: Secondary | ICD-10-CM | POA: Diagnosis present

## 2022-06-15 DIAGNOSIS — B9620 Unspecified Escherichia coli [E. coli] as the cause of diseases classified elsewhere: Secondary | ICD-10-CM | POA: Diagnosis present

## 2022-06-15 DIAGNOSIS — T83511A Infection and inflammatory reaction due to indwelling urethral catheter, initial encounter: Secondary | ICD-10-CM | POA: Diagnosis not present

## 2022-06-15 DIAGNOSIS — C61 Malignant neoplasm of prostate: Secondary | ICD-10-CM | POA: Diagnosis present

## 2022-06-15 DIAGNOSIS — I482 Chronic atrial fibrillation, unspecified: Secondary | ICD-10-CM | POA: Diagnosis not present

## 2022-06-15 DIAGNOSIS — I071 Rheumatic tricuspid insufficiency: Secondary | ICD-10-CM | POA: Diagnosis present

## 2022-06-15 DIAGNOSIS — I4819 Other persistent atrial fibrillation: Secondary | ICD-10-CM | POA: Diagnosis present

## 2022-06-15 DIAGNOSIS — Z7189 Other specified counseling: Secondary | ICD-10-CM | POA: Diagnosis not present

## 2022-06-15 DIAGNOSIS — B964 Proteus (mirabilis) (morganii) as the cause of diseases classified elsewhere: Secondary | ICD-10-CM | POA: Diagnosis present

## 2022-06-15 DIAGNOSIS — N139 Obstructive and reflux uropathy, unspecified: Secondary | ICD-10-CM | POA: Diagnosis not present

## 2022-06-15 DIAGNOSIS — D631 Anemia in chronic kidney disease: Secondary | ICD-10-CM | POA: Diagnosis present

## 2022-06-15 DIAGNOSIS — M109 Gout, unspecified: Secondary | ICD-10-CM | POA: Diagnosis present

## 2022-06-15 DIAGNOSIS — N401 Enlarged prostate with lower urinary tract symptoms: Secondary | ICD-10-CM | POA: Diagnosis present

## 2022-06-15 DIAGNOSIS — I129 Hypertensive chronic kidney disease with stage 1 through stage 4 chronic kidney disease, or unspecified chronic kidney disease: Secondary | ICD-10-CM | POA: Diagnosis present

## 2022-06-15 DIAGNOSIS — C7951 Secondary malignant neoplasm of bone: Secondary | ICD-10-CM | POA: Diagnosis present

## 2022-06-15 DIAGNOSIS — Z87440 Personal history of urinary (tract) infections: Secondary | ICD-10-CM

## 2022-06-15 DIAGNOSIS — D649 Anemia, unspecified: Secondary | ICD-10-CM | POA: Diagnosis not present

## 2022-06-15 DIAGNOSIS — Z6822 Body mass index (BMI) 22.0-22.9, adult: Secondary | ICD-10-CM

## 2022-06-15 DIAGNOSIS — G8929 Other chronic pain: Secondary | ICD-10-CM | POA: Diagnosis present

## 2022-06-15 DIAGNOSIS — N390 Urinary tract infection, site not specified: Secondary | ICD-10-CM

## 2022-06-15 DIAGNOSIS — Z86711 Personal history of pulmonary embolism: Secondary | ICD-10-CM

## 2022-06-15 DIAGNOSIS — Z888 Allergy status to other drugs, medicaments and biological substances status: Secondary | ICD-10-CM

## 2022-06-15 DIAGNOSIS — I495 Sick sinus syndrome: Secondary | ICD-10-CM | POA: Diagnosis present

## 2022-06-15 DIAGNOSIS — G300 Alzheimer's disease with early onset: Secondary | ICD-10-CM | POA: Diagnosis present

## 2022-06-15 DIAGNOSIS — G3184 Mild cognitive impairment, so stated: Secondary | ICD-10-CM | POA: Diagnosis present

## 2022-06-15 DIAGNOSIS — G894 Chronic pain syndrome: Secondary | ICD-10-CM | POA: Diagnosis present

## 2022-06-15 DIAGNOSIS — Z8739 Personal history of other diseases of the musculoskeletal system and connective tissue: Secondary | ICD-10-CM | POA: Diagnosis not present

## 2022-06-15 DIAGNOSIS — N138 Other obstructive and reflux uropathy: Secondary | ICD-10-CM | POA: Diagnosis present

## 2022-06-15 DIAGNOSIS — N179 Acute kidney failure, unspecified: Principal | ICD-10-CM | POA: Diagnosis present

## 2022-06-15 DIAGNOSIS — N136 Pyonephrosis: Secondary | ICD-10-CM | POA: Diagnosis present

## 2022-06-15 DIAGNOSIS — Z95810 Presence of automatic (implantable) cardiac defibrillator: Secondary | ICD-10-CM | POA: Diagnosis not present

## 2022-06-15 DIAGNOSIS — I442 Atrioventricular block, complete: Secondary | ICD-10-CM | POA: Diagnosis present

## 2022-06-15 DIAGNOSIS — R197 Diarrhea, unspecified: Secondary | ICD-10-CM | POA: Diagnosis present

## 2022-06-15 DIAGNOSIS — Z7901 Long term (current) use of anticoagulants: Secondary | ICD-10-CM

## 2022-06-15 DIAGNOSIS — I959 Hypotension, unspecified: Secondary | ICD-10-CM | POA: Diagnosis present

## 2022-06-15 DIAGNOSIS — R531 Weakness: Secondary | ICD-10-CM | POA: Diagnosis present

## 2022-06-15 DIAGNOSIS — Z515 Encounter for palliative care: Secondary | ICD-10-CM | POA: Diagnosis not present

## 2022-06-15 DIAGNOSIS — Z66 Do not resuscitate: Secondary | ICD-10-CM | POA: Diagnosis present

## 2022-06-15 DIAGNOSIS — R319 Hematuria, unspecified: Secondary | ICD-10-CM

## 2022-06-15 DIAGNOSIS — Z79899 Other long term (current) drug therapy: Secondary | ICD-10-CM

## 2022-06-15 LAB — CBC WITH DIFFERENTIAL/PLATELET
Abs Immature Granulocytes: 0.02 10*3/uL (ref 0.00–0.07)
Basophils Absolute: 0 10*3/uL (ref 0.0–0.1)
Basophils Relative: 1 %
Eosinophils Absolute: 0.1 10*3/uL (ref 0.0–0.5)
Eosinophils Relative: 1 %
HCT: 35 % — ABNORMAL LOW (ref 39.0–52.0)
Hemoglobin: 10.8 g/dL — ABNORMAL LOW (ref 13.0–17.0)
Immature Granulocytes: 0 %
Lymphocytes Relative: 9 %
Lymphs Abs: 0.7 10*3/uL (ref 0.7–4.0)
MCH: 27.8 pg (ref 26.0–34.0)
MCHC: 30.9 g/dL (ref 30.0–36.0)
MCV: 90 fL (ref 80.0–100.0)
Monocytes Absolute: 0.5 10*3/uL (ref 0.1–1.0)
Monocytes Relative: 8 %
Neutro Abs: 5.7 10*3/uL (ref 1.7–7.7)
Neutrophils Relative %: 81 %
Platelets: 330 10*3/uL (ref 150–400)
RBC: 3.89 MIL/uL — ABNORMAL LOW (ref 4.22–5.81)
RDW: 15.5 % (ref 11.5–15.5)
WBC: 7 10*3/uL (ref 4.0–10.5)
nRBC: 0 % (ref 0.0–0.2)

## 2022-06-15 LAB — URINALYSIS, ROUTINE W REFLEX MICROSCOPIC
Bilirubin Urine: NEGATIVE
Glucose, UA: NEGATIVE mg/dL
Ketones, ur: NEGATIVE mg/dL
Nitrite: NEGATIVE
Protein, ur: >=300 mg/dL — AB
RBC / HPF: >50 RBC/hpf — ABNORMAL HIGH (ref 0–5)
Specific Gravity, Urine: 1.015 (ref 1.005–1.030)
WBC, UA: >50 WBC/hpf — ABNORMAL HIGH (ref 0–5)
pH: 8 (ref 5.0–8.0)

## 2022-06-15 LAB — COMPREHENSIVE METABOLIC PANEL
ALT: 14 U/L (ref 0–44)
AST: 30 U/L (ref 15–41)
Albumin: 3 g/dL — ABNORMAL LOW (ref 3.5–5.0)
Alkaline Phosphatase: 77 U/L (ref 38–126)
Anion gap: 7 (ref 5–15)
BUN: 45 mg/dL — ABNORMAL HIGH (ref 8–23)
CO2: 26 mmol/L (ref 22–32)
Calcium: 10.1 mg/dL (ref 8.9–10.3)
Chloride: 113 mmol/L — ABNORMAL HIGH (ref 98–111)
Creatinine, Ser: 2.14 mg/dL — ABNORMAL HIGH (ref 0.61–1.24)
GFR, Estimated: 31 mL/min — ABNORMAL LOW (ref >60)
Glucose, Bld: 133 mg/dL — ABNORMAL HIGH (ref 70–99)
Potassium: 4.5 mmol/L (ref 3.5–5.1)
Sodium: 146 mmol/L — ABNORMAL HIGH (ref 135–145)
Total Bilirubin: 0.8 mg/dL (ref 0.3–1.2)
Total Protein: 7 g/dL (ref 6.5–8.1)

## 2022-06-15 LAB — LACTIC ACID, PLASMA
Lactic Acid, Venous: 1.8 mmol/L (ref 0.5–1.9)
Lactic Acid, Venous: 1.2 mmol/L (ref 0.5–1.9)

## 2022-06-15 LAB — PROTIME-INR
INR: 2 — ABNORMAL HIGH (ref 0.8–1.2)
Prothrombin Time: 22.4 seconds — ABNORMAL HIGH (ref 11.4–15.2)

## 2022-06-15 LAB — APTT: aPTT: 34 seconds (ref 24–36)

## 2022-06-15 MED ORDER — ORAL CARE MOUTH RINSE: 15.0000 mL | OROMUCOSAL | Status: DC | PRN

## 2022-06-15 MED ORDER — SODIUM CHLORIDE 0.9 % IV SOLN
2.0000 g | INTRAVENOUS | Status: DC
  Administered 2022-06-16 (×2): 2 g via INTRAVENOUS
  Filled 2022-06-15 (×2): qty 20

## 2022-06-15 MED ORDER — ATORVASTATIN CALCIUM 10 MG PO TABS
10.0000 mg | ORAL_TABLET | Freq: Every day | ORAL | Status: DC
  Administered 2022-06-15 – 2022-06-17 (×3): 10 mg via ORAL
  Filled 2022-06-15 (×3): qty 1

## 2022-06-15 MED ORDER — TROSPIUM CHLORIDE ER 60 MG PO CP24
60.0000 mg | ORAL_CAPSULE | Freq: Every day | ORAL | Status: DC
  Administered 2022-06-17 – 2022-06-18 (×2): 60 mg via ORAL

## 2022-06-15 MED ORDER — SODIUM CHLORIDE 0.9% FLUSH
3.0000 mL | Freq: Two times a day (BID) | INTRAVENOUS | Status: DC
  Administered 2022-06-16 – 2022-06-18 (×6): 3 mL via INTRAVENOUS

## 2022-06-15 MED ORDER — APIXABAN 5 MG PO TABS
5.0000 mg | ORAL_TABLET | Freq: Two times a day (BID) | ORAL | Status: DC
Start: 2022-06-15 — End: 2022-06-18
  Administered 2022-06-15 – 2022-06-18 (×6): 5 mg via ORAL
  Filled 2022-06-15 (×6): qty 1

## 2022-06-15 MED ORDER — MEMANTINE HCL 5 MG PO TABS
10.0000 mg | ORAL_TABLET | Freq: Two times a day (BID) | ORAL | Status: DC
  Administered 2022-06-15 – 2022-06-18 (×6): 10 mg via ORAL
  Filled 2022-06-15 (×6): qty 2

## 2022-06-15 MED ORDER — CHLORHEXIDINE GLUCONATE CLOTH 2 % EX PADS
6.0000 | MEDICATED_PAD | Freq: Every day | CUTANEOUS | Status: DC
Start: 2022-06-16 — End: 2022-06-18
  Administered 2022-06-16 – 2022-06-17 (×2): 6 via TOPICAL

## 2022-06-15 MED ORDER — ACETAMINOPHEN 650 MG RE SUPP: 650.0000 mg | Freq: Four times a day (QID) | RECTAL | Status: DC | PRN

## 2022-06-15 MED ORDER — TAMSULOSIN HCL 0.4 MG PO CAPS: 0.4000 mg | ORAL_CAPSULE | Freq: Every day | ORAL | Status: DC

## 2022-06-15 MED ORDER — SODIUM CHLORIDE 0.9 % IV BOLUS
1000.0000 mL | Freq: Once | INTRAVENOUS | Status: AC
  Administered 2022-06-15: 1000 mL via INTRAVENOUS

## 2022-06-15 MED ORDER — FENOFIBRATE 160 MG PO TABS
160.0000 mg | ORAL_TABLET | Freq: Every day | ORAL | Status: DC
  Administered 2022-06-16 – 2022-06-18 (×3): 160 mg via ORAL
  Filled 2022-06-15 (×3): qty 1

## 2022-06-15 MED ORDER — LACTATED RINGERS IV BOLUS
500.0000 mL | Freq: Once | INTRAVENOUS | Status: AC
  Administered 2022-06-15: 500 mL via INTRAVENOUS

## 2022-06-15 MED ORDER — POLYETHYLENE GLYCOL 3350 17 G PO PACK: 17.0000 g | PACK | Freq: Every day | ORAL | Status: DC | PRN

## 2022-06-15 MED ORDER — MIRTAZAPINE 30 MG PO TABS
30.0000 mg | ORAL_TABLET | Freq: Every day | ORAL | Status: DC
  Administered 2022-06-15 – 2022-06-17 (×3): 30 mg via ORAL
  Filled 2022-06-15 (×3): qty 1

## 2022-06-15 MED ORDER — ACETAMINOPHEN 325 MG PO TABS
650.0000 mg | ORAL_TABLET | Freq: Four times a day (QID) | ORAL | Status: DC | PRN
  Administered 2022-06-17: 650 mg via ORAL
  Filled 2022-06-15: qty 2

## 2022-06-15 MED ORDER — LACTATED RINGERS IV SOLN: INTRAVENOUS | Status: AC

## 2022-06-15 MED ORDER — ORAL CARE MOUTH RINSE
15.0000 mL | OROMUCOSAL | Status: DC
  Administered 2022-06-16 – 2022-06-18 (×10): 15 mL via OROMUCOSAL

## 2022-06-15 NOTE — ED Provider Notes (Signed)
[facility] DEPT Provider Note   CSN: [id number] Arrival date & time: 06/15/22  1417     History  Chief Complaint  Patient presents with   Weakness   Level 5 caveat: Dementia Eric Krueger is a 76 y.o. male with a past medical history of dementia who presents to the emergency department with concerns for generalized weakness onset yesterday.  Patient's wife notes that patient was altered last night and informed her that he was in [place].  Wife brought him into the ED due to concerns for UTI as well as decreased urine output.  Wife notes that patient has had an episode of watery malodorous nonbloody diarrhea once today.  Wife notes the patient is scheduled for urology procedure with Dr. Abner Greenspan on 07/01/2022.  She notes that he has had stents placed in his bilateral kidneys during his recent admission.  She spoke with the patient's urologist, Dr. Abner Greenspan who noted the patient should be evaluated in the emergency department.  Denies melena, chest pain, shortness of breath, abdominal pain, nausea, vomiting, fever.   The history is provided by the patient and the spouse. No language interpreter was used.       Home Medications Prior to Admission medications   Medication Sig Start Date End Date Taking? Authorizing Provider  acetaminophen (TYLENOL) 500 MG tablet Take 500 mg by mouth every 6 (six) hours as needed. 09/22/16   [provider]  atorvastatin (LIPITOR) 10 MG tablet Take 10 mg by mouth at bedtime. 02/15/15   [provider]  CRANBERRY PO Take 1 tablet by mouth daily.    [provider]  ELIQUIS 5 MG TABS tablet Take 1 tablet (5 mg total) by mouth 2 (two) times daily. 09/19/21   Festus Aloe, MD  fenofibrate (TRICOR) 145 MG tablet Take 145 mg by mouth daily. 02/15/15   [provider]  memantine (NAMENDA) 10 MG tablet Take 10 mg by mouth 2 (two) times daily. 06/02/21   [provider]  mirtazapine (REMERON) 30 MG  tablet Take 30 mg by mouth at bedtime. 04/24/22   [provider]  Multiple Vitamin (MULTIVITAMIN) tablet Take 1 tablet by mouth daily.    [provider]  Omega-3 Fatty Acids (OMEGA-3 PO) Take 1 capsule by mouth daily.    [provider]  sotalol (BETAPACE) 80 MG tablet Take 80 mg by mouth 2 (two) times daily. Patient not taking: Reported on 05/11/2022 03/28/15   [provider]  tamsulosin (FLOMAX) 0.4 MG CAPS capsule Take 0.4 mg by mouth daily. 05/06/21   [provider]  Trospium Chloride 60 MG CP24 Take 60 mg by mouth daily.    [provider]      Allergies    Nsaids, Saw palmetto, Vytorin [ezetimibe-simvastatin], and Adhesive [tape]    Review of Systems   Review of Systems  Unable to perform ROS: Dementia  Respiratory:  Negative for shortness of breath.   Cardiovascular:  Negative for chest pain.  Gastrointestinal:  Negative for nausea and vomiting.  Neurological:  Positive for weakness.  All other systems reviewed and are negative.   Physical Exam Updated Vital Signs BP 96/71   Pulse 80   Temp 98.3 F (36.8 C) (Oral)   Resp (!) 26   SpO2 97%  Physical Exam Vitals and nursing note reviewed. Exam conducted with a chaperone present.  Constitutional:      General: He is not in acute distress.    Appearance: He is not  diaphoretic.  HENT:     Head: Normocephalic and atraumatic.     Mouth/Throat:     Pharynx: No oropharyngeal exudate.  Eyes:     General: No scleral icterus.    Conjunctiva/sclera: Conjunctivae normal.  Cardiovascular:     Rate and Rhythm: Normal rate and regular rhythm.     Pulses: Normal pulses.     Heart sounds: Normal heart sounds.  Pulmonary:     Effort: Pulmonary effort is normal. No respiratory distress.     Breath sounds: Normal breath sounds. No wheezing.  Abdominal:     General: Bowel sounds are normal.     Palpations: Abdomen is soft. There is no mass.     Tenderness: There is no abdominal  tenderness. There is no guarding or rebound.  Genitourinary:    Penis: Normal and circumcised. No erythema, tenderness, discharge or swelling.      Comments: RN chaperone present for exam.  No ulceration or yeast noted to the area of the indwelling Foley catheter.  No leaking noted from the catheter.  Hematuria noted in Foley catheter bag. Musculoskeletal:        General: Normal range of motion.     Cervical back: Normal range of motion and neck supple.  Skin:    General: Skin is warm and dry.  Neurological:     Mental Status: He is alert.     Cranial Nerves: Cranial nerves 2-12 are intact.     Sensory: Sensation is intact.     Motor: Motor function is intact.     Comments: Negative pronator drift.  Grip strength 5/5 bilaterally.  Patient alert and oriented to self.  Psychiatric:        Behavior: Behavior normal.     ED Results / Procedures / Treatments   Labs (all labs ordered are listed, but only abnormal results are displayed) Labs Reviewed  COMPREHENSIVE METABOLIC PANEL - Abnormal; Notable for the following components:      Result Value   Sodium 146 (*)    Chloride 113 (*)    Glucose, Bld 133 (*)    BUN 45 (*)    Creatinine, Ser 2.14 (*)    Albumin 3.0 (*)    GFR, Estimated 31 (*)    All other components within normal limits  CBC WITH DIFFERENTIAL/PLATELET - Abnormal; Notable for the following components:   RBC 3.89 (*)    Hemoglobin 10.8 (*)    HCT 35.0 (*)    All other components within normal limits  PROTIME-INR - Abnormal; Notable for the following components:   Prothrombin Time 22.4 (*)    INR 2.0 (*)    All other components within normal limits  URINALYSIS, ROUTINE W REFLEX MICROSCOPIC - Abnormal; Notable for the following components:   Color, Urine AMBER (*)    APPearance TURBID (*)    Hgb urine dipstick MODERATE (*)    Protein, ur >=300 (*)    Leukocytes,Ua LARGE (*)    RBC / HPF >50 (*)    WBC, UA >50 (*)    Bacteria, UA MANY (*)    All other  components within normal limits  CULTURE, BLOOD (ROUTINE X 2)  CULTURE, BLOOD (ROUTINE X 2)  URINE CULTURE  LACTIC ACID, PLASMA  LACTIC ACID, PLASMA  APTT  COMPREHENSIVE METABOLIC PANEL  CBC    EKG None  Radiology CT Renal Stone Study  Result Date: 06/15/2022 CLINICAL DATA:  Foreign body, GU tract Patient stents in place. History of obstructive uropathy  EXAM: CT ABDOMEN AND PELVIS WITHOUT CONTRAST TECHNIQUE: Multidetector CT imaging of the abdomen and pelvis was performed following the standard protocol without IV contrast. RADIATION DOSE REDUCTION: This exam was performed according to the departmental dose-optimization program which includes automated exposure control, adjustment of the mA and/or kV according to patient size and/or use of iterative reconstruction technique. COMPARISON:  CT 05/14/2022. FINDINGS: Lower chest: Bibasilar scarring/linear atelectasis. Mild cardiomegaly with pacemaker/AICD leads in the right atrium and right ventricle. Hepatobiliary: No focal liver abnormality is seen. The gallbladder is unremarkable. Pancreas: Unremarkable. No pancreatic ductal dilatation or surrounding inflammatory changes. Spleen: Normal in size without focal abnormality. Adrenals/Urinary Tract: Adrenal glands are unremarkable. Atrophic left kidney. There are bilateral nephroureteral stents in place there is increased prominence of right renal collecting system and proximal ureter with decompression distally. No left-sided hydroureteronephrosis. The bladder is decompressed with Foley catheter in place. Stomach/Bowel: Mild distention of the stomach filled with fluid. There is no evidence of bowel obstruction.The appendix is normal. Moderate stool burden in the colon. Vascular/Lymphatic: Aortoiliac atherosclerosis. No AAA. No lymphadenopathy. Reproductive: Unremarkable. Other: There is a left inguinal hernia containing nonobstructed sigmoid colon. Fat containing right inguinal hernia. There is minimal  stranding on the left. Musculoskeletal: Similar distribution of osseous sclerotic metastatic disease, particularly in the pelvis. No acute osseous abnormality. IMPRESSION: Bilateral nephroureteral stents are in place. Increased right sided hydronephrosis. Resolved left hydronephrosis. Left inguinal hernia containing non-obstructed sigmoid colon, with mild adjacent stranding, correlate with tenderness and reducibility. Unchanged sclerotic osseous metastatic disease. Electronically Signed   By: Maurine Simmering M.D.   On: 06/15/2022 17:20   DG Chest Portable 1 View  Result Date: 06/15/2022 CLINICAL DATA:  Questionable sepsis - evaluate for abnormality EXAM: PORTABLE CHEST 1 VIEW COMPARISON:  None Available. FINDINGS: Left chest wall dual lead pacemaker and defibrillator in grossly similar position. The heart and mediastinal contours are unchanged. Aortic calcification. No focal consolidation. No pulmonary edema. No pleural effusion. No pneumothorax. No acute osseous abnormality. IMPRESSION: 1. No active disease. 2.  Aortic Atherosclerosis (ICD10-I70.0). Electronically Signed   By: Iven Finn M.D.   On: 06/15/2022 16:00    Procedures Procedures    Medications Ordered in ED Medications  fenofibrate tablet 160 mg (has no administration in time range)  atorvastatin (LIPITOR) tablet 10 mg (has no administration in time range)  memantine (NAMENDA) tablet 10 mg (has no administration in time range)  mirtazapine (REMERON) tablet 30 mg (has no administration in time range)  tamsulosin (FLOMAX) capsule 0.4 mg (has no administration in time range)  sodium chloride flush (NS) 0.9 % injection 3 mL (has no administration in time range)  lactated ringers infusion (has no administration in time range)  lactated ringers bolus 500 mL (has no administration in time range)  acetaminophen (TYLENOL) tablet 650 mg (has no administration in time range)    Or  acetaminophen (TYLENOL) suppository 650 mg (has no  administration in time range)  polyethylene glycol (MIRALAX / GLYCOLAX) packet 17 g (has no administration in time range)  cefTRIAXone (ROCEPHIN) 2 g in sodium chloride 0.9 % 100 mL IVPB (has no administration in time range)  sodium chloride 0.9 % bolus 1,000 mL (0 mLs Intravenous Stopped 06/15/22 1802)    ED Course/ Medical Decision Making/ A&P Clinical Course as of 06/15/22 2156  Wed Jun 15, 2022  1601 Informed by radiology that patient family is declining CT scan of head today.  [SB]  1705 Attending evaluated patient and agreeable with treatment plan of  IVF and CT renal stone study to rule out obstruction. [SB]  2035 Patient reevaluated and resting comfortably on the stretcher.  Repeat blood pressure by myself noted blood pressure to be 90/53.  Discussed with patient wife and son regarding admission plans.  Wife agreeable at this time.   [SB]  2122 Consult with hospitalist Dr. Trilby Drummer who agrees with admission plans at this time. [SB]  2136 Discussed with patient wife regarding plans for admission.  Wife agreeable at this time.  [SB]  2145 Consult with urologist, Dr. Matilde Sprang who was made aware of patient admission. [SB]    Clinical Course User Index [SB] Latanja Lehenbauer A, PA-C                           Medical Decision Making Amount and/or Complexity of Data Reviewed Labs: ordered. Radiology: ordered.  Risk Decision regarding hospitalization.   Pt presents with concerns for generalized weakness onset yesterday.  Patient has a history of dementia and patient wife noted that patient reported that he was in [place] yesterday which prompted the ED visit today. Vital signs, pt afebrile.  Patient with soft blood pressures initially at 96/71 that improved to 116/56.  Blood pressure at admission at 88/73.  On exam, pt with RN chaperone present for exam.  No ulceration or yeast noted to the area of the indwelling Foley catheter.  No leaking noted from the catheter.  Hematuria noted in Foley  catheter bag.  No focal neurological deficit on exam.  Negative pronator drift.  No acute cardiovascular, respiratory, abdominal exam findings. Differential diagnosis includes sepsis, UTI, kidney stones, pneumonia, electrolyte abnormality, arrhythmia.    Co morbidities that complicate the patient evaluation: Dementia  Additional history obtained:  Additional history obtained from Spouse/Significant Other and Patient Son, Shanon Brow External records from outside source obtained and reviewed including: Patient had a cystoscopy with retrograde pyelogram and ureteral stent placement completed on 05/10/2022 with Dr. Gloriann Loan.  During his recent hospital admission on 05/10/2022 patient had a suprapubic catheter placed at the time.  Labs:  I ordered, and personally interpreted labs.  The pertinent results include:   Initial lactic of 1.8, repeat lactic at 1.2. Blood cultures obtained with results pending at time of admission.  Urine culture obtained with results pending at time of admission. Urinalysis noted for turbid in appearance, moderate hemoglobin, large amount of leukocytes otherwise nitrate negative. Coags slightly elevated, however stable from previous values. CMP with slightly elevated sodium at 146, glucose slightly elevated 133, creatinine elevated 2.14, BUN elevated at 45, GFR decreased at 31 otherwise unremarkable.  Renal function decreased since 3 weeks ago. CBC without leukocytosis otherwise unremarkable  Imaging: I ordered imaging studies including CT renal stone, chest x-ray I independently visualized and interpreted imaging which showed: No acute findings on chest x-ray.  CT renal stone with  Bilateral nephroureteral stents are in place. Increased right sided  hydronephrosis. Resolved left hydronephrosis.    Left inguinal hernia containing non-obstructed sigmoid colon, with  mild adjacent stranding, correlate with tenderness and reducibility.    Unchanged sclerotic osseous metastatic  disease.   I agree with the radiologist interpretation  Medications:  I ordered medication including IV fluids for symptom management I have reviewed the patients home medicines and have made adjustments as needed   Consultations: I requested consultation with the Hospitalist, Dr. Trilby Drummer and discussed lab and imaging findings as well as pertinent plan - they recommend: Admission to the hospital for overnight  observation. -Consultation with urologist, Dr. Matilde Sprang who was made aware of patient's admission to the hospital  Disposition: Presentation suspicious for worsening renal function, could be contributed to AKI.  At this time, patient does not meet sepsis criteria.  Also suspicious for generalized weakness.  Doubt electrolyte abnormality, anemia, kidney stone at this time.  After consideration of the diagnostic results and the patients response to treatment, I feel that the patient would benefit from Admission to the hospital.  Discussed with patient wife and son regarding admission plans.  Patient wife agreeable at this time.  Patient appears safe for admission at this time.   This chart was dictated using voice recognition software, Dragon. Despite the best efforts of this provider to proofread and correct errors, errors may still occur which can change documentation meaning.   Final Clinical Impression(s) / ED Diagnoses Final diagnoses:  Generalized weakness  AKI (acute kidney injury) [facility])    Rx / DC Orders ED Discharge Orders     None         Shatavia Santor A, PA-C 06/15/22 2157    Charlesetta Shanks, MD 06/19/22 [phone]

## 2022-06-15 NOTE — H&P (Addendum)
History and Physical   Eric Krueger ZTI:[id number] DOB: 1946-02-13 DOA: 06/15/2022  PCP: Joneen Boers, MD   Patient coming from: Home  Chief Complaint: Confusion, weakness, blood in urine  HPI: Eric Krueger is a 76 y.o. male with medical history significant of BPH with LUTS, obstructive uropathy, prostate cancer, anemia, atrial fibrillation, complete heart block, sick sinus syndrome, status post AICD, chronic pain, hyperlipidemia, gout, pulmonary embolus, dementia/mild cognitive impairment, diverticulosis presenting with weakness, blood in urine, confusion.  Patient presenting after episode of confusion this morning where he thought he was in [place] and ongoing weakness.  Also with concern for recurrent UTI symptoms with blood in his urine and episode of confusion.  Did recently complete a course of antibiotics in the last couple weeks for UTI.  Was admitted at the end of last month with sepsis secondary to UTI but did require pressors transition to midodrine and then weaned off, of note no leukocytosis during that admission.  Also reportedly had watery malodorous diarrhea today and decreased urinary output of catheter today with some sludge.  They called her urologist who recommended ED evaluation.  Of note, family states that if pressors are needed they will like to have a conversation first.  Ok with antibiotics and IV fluids and trial of midodrine, but would like conversation before pressors as they want to ensure this is necessary because her ultimate goal is to get him home soon as possible.  ED Course: Vital signs in the ED significant for blood pressure in the 82N to 053 systolic with intermittent low maps around 65.  Respiratory rate in the 20s.  Lab work-up included CMP with sodium 146, chloride 113, BUN 45, creatinine 2.14 from baseline 1.3, glucose 133, albumin 3.0.  CBC with hemoglobin stable at 10.8.  PT and INR stably elevated at 20.4 and 2.0 respectively.  Lactic acid normal x2.   Urinalysis with hemoglobin, protein, leukocytes, bacteria.  Urine culture and blood culture pending.  Chest x-ray showing no acute normality.  CT renal stone study with bilateral nephroureteral stent in place with increased right hydronephrosis also noted was left inguinal hernia with nonobstructed small bowel contained within.  Stable osseous metastatic disease also noted.  Patient received a liter of fluids in the ED.  EDP has agreed to speak with urology about patient as he has had stents placed and has upcoming procedure and may need catheter exchange.  Review of Systems: As per HPI otherwise all other systems reviewed and are negative.  Past Medical History:  Diagnosis Date   AKI (acute kidney injury) ([place]) 05/10/2022   Anticoagulant long-term use    eliquis--- managed by cardiology   BPH with urinary obstruction    urologist-- dr gay   CHB (complete heart block) ([place])    CKD (chronic kidney disease), stage III ([place])    Dual implantable cardioverter-defibrillator in situ 03/2011   followed by cardiologist-- dr Rowland Lathe--   pt had DDD pacemaker and changed out for [facility] Jude)  Dual ICD 05/ 2012 for symptomatic polymorphic VT;  generator change 12/ 2016   Dyslipidemia    Early onset Alzheimer's dementia without behavioral disturbance [facility])    neurologist-- dr d. Bjorn Loser   Elevated troponin 05/11/2022   Foley catheter in place    History of gout    remote   History of pulmonary embolism 2011   per pt wife no previous blood clots prior to and none since 2011   Moderate tricuspid regurgitation    per last  echo in care everywhere 05-18-2020  moderate to severe TR , RSVP 37-62mHg   Persistent atrial fibrillation ([place]    approx late 2011 to 01/ 2012 dx AFib  s/p DCCV 01/ 2012 unsuccessful   Sepsis ([facility] 05/10/2022   Septic shock ([place] 05/11/2022   Sigmoid diverticulosis    SSS (sick sinus syndrome) ([place] 01/2011   03/ 2012 s/p  permanent pacemaker  then upgraded to dual ICD 05/ 2012 due to  symptomatic VT   Urine retention 06/2021   has foley catheter   Ventricular tachycardia, 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[facility] 03/2011   pt had recurrent syncopal episode, 03-29-2011 pacemaker intergatted in office baseline Afib and runs of NSVT which correlated with syncopal episodes  s/p removal pacemake and placement dual ICD    Past Surgical History:  Procedure Laterality Date   CARDIAC CATHETERIZATION  01/31/2011   '@[facility]'$ ;  no intracardiac shunting identified, mild volume expansion, normal pulmonary artery pressure, and adequate cardiac output   CARDIAC PACEMAKER PLACEMENT  01/2011   CARDIOVERSION  11/2010   unsuccessful   CATARACT EXTRACTION W/ INTRAOCULAR LENS IMPLANT Bilateral 2021   CYSTOSCOPY W/ URETERAL STENT PLACEMENT Bilateral 05/10/2022   Procedure: CYSTOSCOPY WITH RETROGRADE PYELOGRAM/URETERAL STENT PLACEMENT;  Surgeon: BLucas Mallow MD;  Location: [facility];  Service: Urology;  Laterality: Bilateral;   EP IMPLANTABLE DEVICE  04/04/2011   '@[facility]'$ ;  by dr m. drucker---  EP study with inducible polymorphic VT,  DDD pacemaker removed and  Implanted Dual ICD device  (St Jude)   ICD GENERATOR CHANGE  11/10/2015   '@[facility]'$    TONSILLECTOMY  1950   age 76  TRANSURETHRAL RESECTION OF PROSTATE N/A 09/17/2021   Procedure: TRANSURETHRAL RESECTION OF THE PROSTATE (TURP), BIPOLAR, BLADDER BIOPSY;  Surgeon: GJanith Lima MD;  [facility]  Service: Urology;  Laterality: N/A;    Social History  reports that he has never smoked. He has never used smokeless tobacco. He reports that he does not currently use alcohol. He reports that he does not use drugs.  Allergies  Allergen Reactions   Nsaids Other (See Comments)    Cannot take due to defibrillator/pacemaker   Saw [place] Diarrhea   Vytorin [Ezetimibe-Simvastatin] Other (See Comments)    Myalgias    Adhesive [Tape] Rash    Family History  Problem Relation Age of Onset   Diabetes Mellitus II Other   Reviewed on  admission  Prior to Admission medications   Medication Sig Start Date End Date Taking? Authorizing Provider  acetaminophen (TYLENOL) 500 MG tablet Take 500 mg by mouth every 6 (six) hours as needed. 09/22/16   [provider]  atorvastatin (LIPITOR) 10 MG tablet Take 10 mg by mouth at bedtime. 02/15/15   [provider]  CRANBERRY PO Take 1 tablet by mouth daily.    [provider]  ELIQUIS 5 MG TABS tablet Take 1 tablet (5 mg total) by mouth 2 (two) times daily. 09/19/21   EFestus Aloe MD  fenofibrate (TRICOR) 145 MG tablet Take 145 mg by mouth daily. 02/15/15   [provider]  memantine (NAMENDA) 10 MG tablet Take 10 mg by mouth 2 (two) times daily. 06/02/21   [provider]  mirtazapine (REMERON) 30 MG tablet Take 30 mg by mouth at bedtime. 04/24/22   [provider]  Multiple Vitamin (MULTIVITAMIN) tablet Take 1 tablet by mouth daily.    [provider]  Omega-3 Fatty Acids (OMEGA-3 PO) Take 1 capsule by mouth daily.  [provider]  sotalol (BETAPACE) 80 MG tablet Take 80 mg by mouth 2 (two) times daily. Patient not taking: Reported on 05/11/2022 03/28/15   [provider]  tamsulosin (FLOMAX) 0.4 MG CAPS capsule Take 0.4 mg by mouth daily. 05/06/21   [provider]  Trospium Chloride 60 MG CP24 Take 60 mg by mouth daily.    [provider]   Physical Exam: Vitals:   06/15/22 1832 06/15/22 1940 06/15/22 2030 06/15/22 2229  BP:  (!) 149/139 (!) 88/73   Pulse:  80 80   Resp:  (!) 27 (!) 27   Temp: 97.8 F (36.6 C)   (!) 97.5 F (36.4 C)  TempSrc: Oral   Oral  SpO2:  98% 98%     Physical Exam Constitutional:      General: He is not in acute distress.    Comments: Thin, chronically ill-appearing, elderly male.  HENT:     Head: Normocephalic and atraumatic.     Mouth/Throat:     Mouth: Mucous membranes are moist.     Pharynx: Oropharynx is clear.  Eyes:     Extraocular  Movements: Extraocular movements intact.     Pupils: Pupils are equal, round, and reactive to light.  Cardiovascular:     Rate and Rhythm: Normal rate and regular rhythm.     Pulses: Normal pulses.     Heart sounds: Normal heart sounds.  Pulmonary:     Effort: Pulmonary effort is normal. No respiratory distress.     Breath sounds: Normal breath sounds.  Abdominal:     General: Bowel sounds are normal. There is no distension.     Palpations: Abdomen is soft.     Tenderness: There is no abdominal tenderness.  Musculoskeletal:        General: No swelling or deformity.  Skin:    General: Skin is warm and dry.     Comments: ICD/pacemaker at left chest  Neurological:     General: No focal deficit present.     Mental Status: Mental status is at baseline.     Labs on Admission: I have personally reviewed following labs and imaging studies  CBC: Recent Labs  Lab 06/15/22 1516  WBC 7.0  NEUTROABS 5.7  HGB 10.8*  HCT 35.0*  MCV 90.0  PLT 536    Basic Metabolic Panel: Recent Labs  Lab 06/15/22 1516  NA 146*  K 4.5  CL 113*  CO2 26  GLUCOSE 133*  BUN 45*  CREATININE 2.14*  CALCIUM 10.1    GFR: CrCl cannot be calculated (Unknown ideal weight.).  Liver Function Tests: Recent Labs  Lab 06/15/22 1516  AST 30  ALT 14  ALKPHOS 77  BILITOT 0.8  PROT 7.0  ALBUMIN 3.0*    Urine analysis:    Component Value Date/Time   COLORURINE AMBER (A) 06/15/2022 1635   APPEARANCEUR TURBID (A) 06/15/2022 1635   LABSPEC 1.015 06/15/2022 1635   PHURINE 8.0 06/15/2022 1635   GLUCOSEU NEGATIVE 06/15/2022 1635   HGBUR MODERATE (A) 06/15/2022 1635   BILIRUBINUR NEGATIVE 06/15/2022 1635   KETONESUR NEGATIVE 06/15/2022 1635   PROTEINUR >=300 (A) 06/15/2022 1635   NITRITE NEGATIVE 06/15/2022 1635   LEUKOCYTESUR LARGE (A) 06/15/2022 1635    Radiological Exams on Admission: CT Renal Stone Study  Result Date: 06/15/2022 CLINICAL DATA:  Foreign body, GU tract Patient stents in  place. History of obstructive uropathy EXAM: CT ABDOMEN AND PELVIS WITHOUT CONTRAST TECHNIQUE: Multidetector CT imaging of the abdomen and  pelvis was performed following the standard protocol without IV contrast. RADIATION DOSE REDUCTION: This exam was performed according to the departmental dose-optimization program which includes automated exposure control, adjustment of the mA and/or kV according to patient size and/or use of iterative reconstruction technique. COMPARISON:  CT 05/14/2022. FINDINGS: Lower chest: Bibasilar scarring/linear atelectasis. Mild cardiomegaly with pacemaker/AICD leads in the right atrium and right ventricle. Hepatobiliary: No focal liver abnormality is seen. The gallbladder is unremarkable. Pancreas: Unremarkable. No pancreatic ductal dilatation or surrounding inflammatory changes. Spleen: Normal in size without focal abnormality. Adrenals/Urinary Tract: Adrenal glands are unremarkable. Atrophic left kidney. There are bilateral nephroureteral stents in place there is increased prominence of right renal collecting system and proximal ureter with decompression distally. No left-sided hydroureteronephrosis. The bladder is decompressed with Foley catheter in place. Stomach/Bowel: Mild distention of the stomach filled with fluid. There is no evidence of bowel obstruction.The appendix is normal. Moderate stool burden in the colon. Vascular/Lymphatic: Aortoiliac atherosclerosis. No AAA. No lymphadenopathy. Reproductive: Unremarkable. Other: There is a left inguinal hernia containing nonobstructed sigmoid colon. Fat containing right inguinal hernia. There is minimal stranding on the left. Musculoskeletal: Similar distribution of osseous sclerotic metastatic disease, particularly in the pelvis. No acute osseous abnormality. IMPRESSION: Bilateral nephroureteral stents are in place. Increased right sided hydronephrosis. Resolved left hydronephrosis. Left inguinal hernia containing non-obstructed  sigmoid colon, with mild adjacent stranding, correlate with tenderness and reducibility. Unchanged sclerotic osseous metastatic disease. Electronically Signed   By: Maurine Simmering M.D.   On: 06/15/2022 17:20   DG Chest Portable 1 View  Result Date: 06/15/2022 CLINICAL DATA:  Questionable sepsis - evaluate for abnormality EXAM: PORTABLE CHEST 1 VIEW COMPARISON:  None Available. FINDINGS: Left chest wall dual lead pacemaker and defibrillator in grossly similar position. The heart and mediastinal contours are unchanged. Aortic calcification. No focal consolidation. No pulmonary edema. No pleural effusion. No pneumothorax. No acute osseous abnormality. IMPRESSION: 1. No active disease. 2.  Aortic Atherosclerosis (ICD10-I70.0). Electronically Signed   By: Iven Finn M.D.   On: 06/15/2022 16:00    EKG: Independently reviewed.  Atrial fibrillation at 83 bpm, possible pacing, difficult to interpret with A-fib/baseline artifact.  Widened QRS consistent with pacing appears to be different from previous admission however similar to EKG in August 2022.  Assessment/Plan Active Problems:   Obstructive uropathy   Anemia   Atrial fibrillation, chronic (HCC)   Benign non-nodular prostatic hyperplasia with lower urinary tract symptoms   Chronic pain disorder   Complete heart block (HCC)   Dual implantable cardioverter-defibrillator in situ   Dyslipidemia   History of gout   History of pulmonary embolism   Mild cognitive impairment with memory loss   Sick sinus syndrome (HCC)   UTI (urinary tract infection)   Prostate cancer (HCC)   AKI (acute kidney injury) ([place])   AKI Hyponatremia > Patient presenting with multiple symptoms including blood in urine and weakness as below.  Found to have creatinine elevated 2.14 from baseline of 1.3.  Patient also noted to have sodium of 146. > CT showed bilateral nephroureteral stents in place with increased right hydronephrosis. > Had had decreased output, EDP states  some output has increased since IV fluids given in the ED. > Unclear if some due to degree of obstruction though this seems less likely given increased output with IV fluids, possibly due to dehydration.  EDP is consulting urology for discussion if there is any need for urological intervention, he may need catheter changes below at least. - Monitor  on stepdown unit due to hypotension as below. - Continue with IV fluids, received 1 L in the ED will give additional 0.5 L and a rate of 125 cc an hour - Trend renal function and electrolytes - Avoid nephrotoxic agents - Follow-up in urology recommendations  Urinary tract infection > Patient with history of UTIs and completed recent antibiotic course after admission for sepsis secondary to UTI requiring pressors last month.  Of note at that time no leukocytosis. > Urinalysis could be consistent with UTI with leukocytes and bacteria as well as hemoglobin and protein.  No systemic leukocytosis however as above did not have this during last admission. > Noted to have intermittent hypotension, will monitor on progressive unit.  Patient family has stated that they are okay with IV fluids but would like conversation before initiation of any pressors. > Does have some confusion, on unclear if this is acute confusion secondary to infection on chronic dementia versus related to his hyponatremia versus other. - Monitor on stepdown unit in case pressors are required - Continue with IV fluids as above - Initiate ceftriaxone - Follow-up urine culture and blood cultures - Trend fever curve and WBC  BPH with LUTS Prostate cancer history > Patient with history of obstructive uropathy with bilateral stents in place.  Does have increased urine output with IV fluids in the ED which is reassuring. > Known history of prostate cancer with stable bony mets. > On chronic androgen deprivation therapy. - Continue to monitor - Follow-up in urology recommendations -  Continue home Flomax and trospium  Atrial fibrillation Complete heart block SSS > Status post AICD/pacer placement > EKG in ED appears to be paced rhythm however difficult to interpret with baseline artifact/A-fib morphology appears similar to paced rhythm in August 2022. - Continue to monitor  History of pulmonary embolus - Continue home Eliquis  Hyperlipidemia - Continue home atorvastatin and fenofibrate  Cognitive impairment/dementia - Continue home Namenda - Continue home mirtazapine - Of note, has previously needed as needed Seroquel for delirium during prior admissions.  Wife states he will respond to redirection letting him know that she is not here and that she will be here in the morning.  DVT prophylaxis: Eliquis Code Status:   DNR Family Communication:  Discussed with wife at bedside.  Son would be the person to contact if it appears patient may need to be started on pressors. Disposition Plan:   Patient is from:  Home  Anticipated DC to:  Home  Anticipated DC date:  2 to 3 days  Anticipated DC barriers: None  Consults called:  EDP is consulting urology, have not yet heard back. Admission status:  Inpatient, stepdown  Severity of Illness: The appropriate patient status for this patient is INPATIENT. Inpatient status is judged to be reasonable and necessary in order to provide the required intensity of service to ensure the patient's safety. The patient's presenting symptoms, physical exam findings, and initial radiographic and laboratory data in the context of their chronic comorbidities is felt to place them at high risk for further clinical deterioration. Furthermore, it is not anticipated that the patient will be medically stable for discharge from the hospital within 2 midnights of admission.   * I certify that at the point of admission it is my clinical judgment that the patient will require inpatient hospital care spanning beyond 2 midnights from the point of  admission due to high intensity of service, high risk for further deterioration and high frequency of surveillance required.*  Marcelyn Bruins MD [facility]  How to contact the [facility] Attending or Consulting provider [place] or covering provider during after hours [place], for this patient?   Check the care team in [facility] and look for a) attending/consulting [facility] provider listed and b) the [facility] team listed Log into [email or web address] and use [place]'s universal password to access. If you do not have the password, please contact the hospital operator. Locate the [facility] provider you are looking for under [facility] and page to a number that you can be directly reached. If you still have difficulty reaching the provider, please page the [facility] (Director on Call) for the Hospitalists listed on amion for assistance.  06/15/2022, 10:37 PM

## 2022-06-15 NOTE — ED Notes (Signed)
Pt family member refusing CT head at this time.  States that "his head is not his problem" and "even if you find anything, we won't be treating it".  PA made aware.

## 2022-06-15 NOTE — ED Triage Notes (Signed)
Ems brings pt in from home for generalized weakness and blood in urine. Family states he recently completed antibiotics.

## 2022-06-16 ENCOUNTER — Encounter (HOSPITAL_COMMUNITY): Payer: Self-pay | Admitting: Internal Medicine

## 2022-06-16 ENCOUNTER — Other Ambulatory Visit: Payer: Self-pay | Admitting: Infectious Diseases

## 2022-06-16 DIAGNOSIS — N390 Urinary tract infection, site not specified: Secondary | ICD-10-CM | POA: Diagnosis not present

## 2022-06-16 DIAGNOSIS — E43 Unspecified severe protein-calorie malnutrition: Secondary | ICD-10-CM | POA: Insufficient documentation

## 2022-06-16 DIAGNOSIS — T83511A Infection and inflammatory reaction due to indwelling urethral catheter, initial encounter: Secondary | ICD-10-CM

## 2022-06-16 LAB — COMPREHENSIVE METABOLIC PANEL
ALT: 10 U/L (ref 0–44)
AST: 25 U/L (ref 15–41)
Albumin: 2.4 g/dL — ABNORMAL LOW (ref 3.5–5.0)
Alkaline Phosphatase: 67 U/L (ref 38–126)
Anion gap: 8 (ref 5–15)
BUN: 41 mg/dL — ABNORMAL HIGH (ref 8–23)
CO2: 25 mmol/L (ref 22–32)
Calcium: 9 mg/dL (ref 8.9–10.3)
Chloride: 110 mmol/L (ref 98–111)
Creatinine, Ser: 1.73 mg/dL — ABNORMAL HIGH (ref 0.61–1.24)
GFR, Estimated: 40 mL/min — ABNORMAL LOW (ref >60)
Glucose, Bld: 99 mg/dL (ref 70–99)
Potassium: 3.7 mmol/L (ref 3.5–5.1)
Sodium: 143 mmol/L (ref 135–145)
Total Bilirubin: 0.4 mg/dL (ref 0.3–1.2)
Total Protein: 5.4 g/dL — ABNORMAL LOW (ref 6.5–8.1)

## 2022-06-16 LAB — CBC
HCT: 28.7 % — ABNORMAL LOW (ref 39.0–52.0)
Hemoglobin: 8.8 g/dL — ABNORMAL LOW (ref 13.0–17.0)
MCH: 27.8 pg (ref 26.0–34.0)
MCHC: 30.7 g/dL (ref 30.0–36.0)
MCV: 90.8 fL (ref 80.0–100.0)
Platelets: 251 10*3/uL (ref 150–400)
RBC: 3.16 MIL/uL — ABNORMAL LOW (ref 4.22–5.81)
RDW: 15.4 % (ref 11.5–15.5)
WBC: 4.6 10*3/uL (ref 4.0–10.5)
nRBC: 0 % (ref 0.0–0.2)

## 2022-06-16 LAB — C DIFFICILE QUICK SCREEN W PCR REFLEX
C Diff antigen: POSITIVE — AB
C Diff toxin: NEGATIVE

## 2022-06-16 LAB — MRSA NEXT GEN BY PCR, NASAL: MRSA by PCR Next Gen: NOT DETECTED

## 2022-06-16 LAB — CLOSTRIDIUM DIFFICILE BY PCR, REFLEXED: Toxigenic C. Difficile by PCR: NEGATIVE

## 2022-06-16 MED ORDER — ENSURE ENLIVE PO LIQD
237.0000 mL | Freq: Two times a day (BID) | ORAL | Status: DC
  Administered 2022-06-16 – 2022-06-18 (×5): 237 mL via ORAL

## 2022-06-16 MED ORDER — LACTATED RINGERS IV BOLUS
500.0000 mL | Freq: Once | INTRAVENOUS | Status: AC
  Administered 2022-06-16: 500 mL via INTRAVENOUS

## 2022-06-16 MED ORDER — ADULT MULTIVITAMIN W/MINERALS CH
1.0000 | ORAL_TABLET | Freq: Every day | ORAL | Status: DC
  Administered 2022-06-16 – 2022-06-18 (×3): 1 via ORAL
  Filled 2022-06-16 (×3): qty 1

## 2022-06-16 MED ORDER — LACTATED RINGERS IV SOLN: INTRAVENOUS | Status: DC

## 2022-06-16 MED ORDER — SODIUM CHLORIDE 0.9 % IV BOLUS
500.0000 mL | Freq: Once | INTRAVENOUS | Status: AC
  Administered 2022-06-16: 500 mL via INTRAVENOUS

## 2022-06-16 MED ORDER — MIDODRINE HCL 5 MG PO TABS
5.0000 mg | ORAL_TABLET | Freq: Three times a day (TID) | ORAL | Status: DC
  Administered 2022-06-16 (×3): 5 mg via ORAL
  Filled 2022-06-16 (×3): qty 1

## 2022-06-16 MED ORDER — MIDODRINE HCL 5 MG PO TABS
5.0000 mg | ORAL_TABLET | Freq: Once | ORAL | Status: AC
  Administered 2022-06-16: 5 mg via ORAL
  Filled 2022-06-16: qty 1

## 2022-06-16 NOTE — Plan of Care (Signed)

## 2022-06-16 NOTE — Progress Notes (Signed)
PROGRESS NOTE JOZSEF WESCOAT  SJG:[id number] DOB: 06/17/1946 DOA: 06/15/2022 PCP: Joneen Boers, MD   Brief Narrative/Hospital Course: 76 y.o.m w/ history significant of BPH with LUTS, obstructive uropathy, prostate cancer, anemia, atrial fibrillation, complete heart block, sick sinus syndrome, status post AICD, chronic pain, hyperlipidemia, gout, pulmonary embolus, dementia/mild cognitive impairment, diverticulosis presented with weakness, blood in urine, and confusion episode in morning where he thought he was in [place], had malodorous diarrhea, decreased urinary output of catheter today with some sludge.  They called her urologist who recommended ED evaluation.Recently admitted for sepsis UTI pressures, bilateral stent by urology in June.  In ML:YYTKP BP in the 54S to 568 systolic with intermittent low maps around 65, creatinine 2.14 from baseline 1.3,  hb 10.8. PT and INR stably elevated at 20.4 and 2.0 respectively.  Lactic acid normal x2.Urine culture and blood culture sent , CXR-no acute normality.CT renal stone study with bilateral nephroureteral stent in place with increased right hydronephrosis also noted was left inguinal hernia with nonobstructed small bowel contained within.Stable osseous metastatic disease also noted. Urology consulted, admitted for further management.     Subjective: Seen and examined alert and oriented to self, pleasant, earlier blood pressure in 70s to 80s feeling dizzy, with 500 cc blood pressure improved to 100 Later wife arrived at the bedside requesting for udpate and to discuss w/ MD- then I arrived again and updated her Flexi-Seal was placed overnight due to diarrhea.  Assessment and Plan: Principal Problem:   UTI (urinary tract infection) Active Problems:   Obstructive uropathy   Anemia   Atrial fibrillation, chronic (HCC)   Benign non-nodular prostatic hyperplasia with lower urinary tract symptoms   Chronic pain disorder   Complete heart block (HCC)    Dual implantable cardioverter-defibrillator in situ   Dyslipidemia   History of gout   History of pulmonary embolism   Mild cognitive impairment with memory loss   Sick sinus syndrome ([place])   Prostate cancer ([place])   AKI (acute kidney injury) ([place])  UTI: Continue ceftriaxone, previously had septic shock with suprapubic catheter, obstructive uropathy and malfunctioning catheter seen by urology and had cystoscopy and stent placement in June culture taht time grew Proteus sensitive to cefazolin.  Notified Dr. Abner Greenspan who will see the patient today.f/u blood and urine cx.  Home foley catheter was changed about 2 to 7 days ago  Hypertension-soft BP/hypotension previously needing pressors, added midodrine, Lactate normal.  BP again transiently in 70s after 500 cc improved to 90s to 100.  Again wife mentioned that there approaching palliative route but if he needs pressor they would like to discuss about it-and if it happens during night's she advised to call patient's son  Diarrhea: Since patient is on antibiotics check C. difficile and stool work-up - continue hydration  Hypernatremia-mild-resolved  AKI Obstructive uropathy: Creatinine improving on IV fluids monitor output, BMP Recent Labs  Lab 06/15/22 1516 06/16/22 0316  BUN 45* 41*  CREATININE 2.14* 1.73*    Normocytic anemia, likely anemia of chronic disease, hemoglobin level fluctuates 8 to 11 g.  Monitor Recent Labs  Lab 06/15/22 1516 06/16/22 0316  HGB 10.8* 8.8*  HCT 35.0* 28.7*    BPH with LUTS Prostate cancer history with bony mets stable Chronic pain disorder: Continue pain control, continue antibiotics as #1.  Continue long-term indwelling Foley catheter  Anemia of chronic disease: Stable hemoglobin monitor  Atrial fibrillation, chronic on Eliquis SSS/Complete heart block s/p ICD-paced ekg. Dyslipidemia-on Lipitor and fibrate History of pulmonary embolism-on  Eliquis  Mild cognitive impairment with memory loss: Alert  awake oriented to self which is baseline per wife at the bedside, continue Namenda, Remeron previously needed Seroquel for delirium during prior admission.  Deconditioning/debility, GOC: supportive care, wife requesting taht the patient to be discharged soon as possible after the cultures are back since he does better at home than in the hospital Care and they had caregiver at home, nearing/approaching palliative route. palliative consult consult has been placed.  DVT prophylaxis: ELIQUIS Code Status:   Code Status: DNR Family Communication: plan of care discussed with patient/WIFE at bedside. Patient status is: Inpatient because of low blood pressure, management of UTI pending culture Level of care: Stepdown   Dispo: The patient is from: HOME            Anticipated disposition: HOME IN 1-2 DAYS pending improvement in UTI and blood pressure and urology clearance  Mobility Assessment (last 72 hours)     Mobility Assessment   No documentation.            Objective: Vitals last 24 hrs: Vitals:   06/16/22 0800 06/16/22 0833 06/16/22 0900 06/16/22 0930  BP: (!) 98/41 (!) 72/34 (!) 83/37 (!) 93/57  Pulse: 85 80 80 82  Resp: (!) 25 (!) 25 (!) 28 14  Temp: 98.2 F (36.8 C)     TempSrc: Oral     SpO2: 92% 97% 97% 98%  Weight:       Weight change:   Physical Examination: General exam: alert awake, oriented x 1-2 Older than stated age, weak appearing. HEENT:Oral mucosa moist, Ear/Nose WNL grossly, dentition normal. Respiratory system: bilaterally diminished BS, no use of accessory muscle Cardiovascular system: S1 & S2 +, No JVD. Gastrointestinal system: Abdomen soft,NT,ND, BS+ Nervous System:Alert, awake, moving extremities and grossly nonfocal Extremities: LE edema neg,distal peripheral pulses palpable.  Skin: No rashes,no icterus. MSK: Normal muscle bulk,tone, power Foley catheter in place w/ cloudy urine  Medications reviewed:  Scheduled Meds:  apixaban  5 mg Oral BID    atorvastatin  10 mg Oral QHS   Chlorhexidine Gluconate Cloth  6 each Topical Daily   fenofibrate  160 mg Oral Daily   memantine  10 mg Oral BID   midodrine  5 mg Oral TID WC   mirtazapine  30 mg Oral QHS   mouth rinse  15 mL Mouth Rinse 4 times per day   sodium chloride flush  3 mL Intravenous Q12H   Trospium Chloride  60 mg Oral Daily   Continuous Infusions:  cefTRIAXone (ROCEPHIN)  IV Stopped (06/16/22 0140)      Diet Order             Diet regular Room service appropriate? Yes; Fluid consistency: Thin  Diet effective now                   Intake/Output Summary (Last 24 hours) at 06/16/2022 1055 Last data filed at 06/16/2022 0758 Gross per 24 hour  Intake 1141.29 ml  Output 1050 ml  Net 91.29 ml   Net IO Since Admission: 91.29 mL [06/16/22 1055]  Wt Readings from Last 3 Encounters:  06/16/22 70.8 kg  05/16/22 78.1 kg  04/16/22 76.6 kg     Unresulted Labs (From admission, onward)     Start     Ordered   06/16/22 0837  C Difficile Quick Screen w PCR reflex  (C Difficile quick screen w PCR reflex panel )  Once, for 24 hours,   TIMED  References:    CDiff Information Tool   06/16/22 0836   06/16/22 0837  Gastrointestinal Panel by PCR , Stool  (Gastrointestinal Panel by PCR, Stool                                                                                                                                                     **Does Not include CLOSTRIDIUM DIFFICILE testing. **If CDIFF testing is needed, place order from the "C Difficile Testing" order set.**)  Once,   R        06/16/22 0836   06/15/22 1517  Urine Culture  Once,   URGENT       Question:  Indication  Answer:  Altered mental status (if no other cause identified)   06/15/22 1517          Data Reviewed: I have personally reviewed following labs and imaging studies CBC: Recent Labs  Lab 06/15/22 1516 06/16/22 0316  WBC 7.0 4.6  NEUTROABS 5.7  --   HGB 10.8* 8.8*  HCT 35.0* 28.7*  MCV  90.0 90.8  PLT 330 998   Basic Metabolic Panel: Recent Labs  Lab 06/15/22 1516 06/16/22 0316  NA 146* 143  K 4.5 3.7  CL 113* 110  CO2 26 25  GLUCOSE 133* 99  BUN 45* 41*  CREATININE 2.14* 1.73*  CALCIUM 10.1 9.0   GFR: Estimated Creatinine Clearance: 35.2 mL/min (A) (by C-G formula based on SCr of 1.73 mg/dL (H)). Liver Function Tests: Recent Labs  Lab 06/15/22 1516 06/16/22 0316  AST 30 25  ALT 14 10  ALKPHOS 77 67  BILITOT 0.8 0.4  PROT 7.0 5.4*  ALBUMIN 3.0* 2.4*   No results for input(s): "LIPASE", "AMYLASE" in the last 168 hours. No results for input(s): "AMMONIA" in the last 168 hours. Coagulation Profile: Recent Labs  Lab 06/15/22 1516  INR 2.0*   BNP (last 3 results) No results for input(s): "PROBNP" in the last 8760 hours. HbA1C: No results for input(s): "HGBA1C" in the last 72 hours. CBG: No results for input(s): "GLUCAP" in the last 168 hours. Lipid Profile: No results for input(s): "CHOL", "HDL", "LDLCALC", "TRIG", "CHOLHDL", "LDLDIRECT" in the last 72 hours. Thyroid Function Tests: No results for input(s): "TSH", "T4TOTAL", "FREET4", "T3FREE", "THYROIDAB" in the last 72 hours. Sepsis Labs: Recent Labs  Lab 06/15/22 1516 06/15/22 1642  LATICACIDVEN 1.8 1.2    Recent Results (from the past 240 hour(s))  Blood culture (routine x 2)     Status: None (Preliminary result)   Collection Time: 06/15/22  3:00 PM   Specimen: BLOOD  Result Value Ref Range Status   Specimen Description   Final    BLOOD RIGHT ANTECUBITAL Performed at [facility] [address], [place], [place] [address]    Special Requests   Final  BOTTLES DRAWN AEROBIC AND ANAEROBIC Blood Culture adequate volume Performed at [facility] [address] [place], [place] [address]    Culture   Final    NO GROWTH < 12 HOURS Performed at [facility] [address], [place], [place] [address]    Report Status PENDING  Incomplete   Blood culture (routine x 2)     Status: None (Preliminary result)   Collection Time: 06/15/22  3:45 PM   Specimen: BLOOD RIGHT ARM  Result Value Ref Range Status   Specimen Description   Final    BLOOD RIGHT ARM Performed at [facility], [place] [address], [place], [place] [address]    Special Requests   Final    BOTTLES DRAWN AEROBIC AND ANAEROBIC Blood Culture adequate volume Performed at [facility] [address] [place], [place] [address]    Culture   Final    NO GROWTH < 12 HOURS Performed at [facility] [address] [place], [place] [address]    Report Status PENDING  Incomplete  MRSA Next Gen by PCR, Nasal     Status: None   Collection Time: 06/15/22 11:24 PM   Specimen: Nasal Mucosa; Nasal Swab  Result Value Ref Range Status   MRSA by PCR Next Gen NOT DETECTED NOT DETECTED Final    Comment: (NOTE) The GeneXpert MRSA Assay (FDA approved for NASAL specimens only), is one component of a comprehensive MRSA colonization surveillance program. It is not intended to diagnose MRSA infection nor to guide or monitor treatment for MRSA infections. Test performance is not FDA approved in patients less than 55 years old. Performed at [facility], [place] [address], [place], [place] [address]     Antimicrobials: Anti-infectives (From admission, onward)    Start     Dose/Rate Route Frequency Ordered Stop   06/15/22 2200  cefTRIAXone (ROCEPHIN) 2 g in sodium chloride 0.9 % 100 mL IVPB        2 g 200 mL/hr over 30 Minutes Intravenous Every 24 hours 06/15/22 2132        Culture/Microbiology    Component Value Date/Time   SDES  06/15/2022 1545    BLOOD RIGHT ARM Performed at [facility], [place] [address] [place], [place] [address]    [place]  06/15/2022 1545    BOTTLES DRAWN AEROBIC AND ANAEROBIC Blood Culture adequate volume Performed at [facility] [address] [place], [place]  [address]    CULT  06/15/2022 1545    NO GROWTH < 12 HOURS Performed at [facility] [address], [place],  [address]    REPTSTATUS PENDING 06/15/2022 1545    Other culture-see note  Radiology Studies: CT Renal Stone Study  Result Date: 06/15/2022 CLINICAL DATA:  Foreign body, GU tract Patient stents in place. History of obstructive uropathy EXAM: CT ABDOMEN AND PELVIS WITHOUT CONTRAST TECHNIQUE: Multidetector CT imaging of the abdomen and pelvis was performed following the standard protocol without IV contrast. RADIATION DOSE REDUCTION: This exam was performed according to the departmental dose-optimization program which includes automated exposure control, adjustment of the mA and/or kV according to patient size and/or use of iterative reconstruction technique. COMPARISON:  CT 05/14/2022. FINDINGS: Lower chest: Bibasilar scarring/linear atelectasis. Mild cardiomegaly with pacemaker/AICD leads in the right atrium and right ventricle. Hepatobiliary: No focal liver abnormality is seen. The gallbladder is unremarkable. Pancreas: Unremarkable. No pancreatic ductal dilatation or surrounding inflammatory  changes. Spleen: Normal in size without focal abnormality. Adrenals/Urinary Tract: Adrenal glands are unremarkable. Atrophic left kidney. There are bilateral nephroureteral stents in place there is increased prominence of right renal collecting system and proximal ureter with decompression distally. No left-sided hydroureteronephrosis. The bladder is decompressed with Foley catheter in place. Stomach/Bowel: Mild distention of the stomach filled with fluid. There is no evidence of bowel obstruction.The appendix is normal. Moderate stool burden in the colon. Vascular/Lymphatic: Aortoiliac atherosclerosis. No AAA. No lymphadenopathy. Reproductive: Unremarkable. Other: There is a left inguinal hernia containing nonobstructed sigmoid colon. Fat containing right inguinal hernia. There is minimal  stranding on the left. Musculoskeletal: Similar distribution of osseous sclerotic metastatic disease, particularly in the pelvis. No acute osseous abnormality. IMPRESSION: Bilateral nephroureteral stents are in place. Increased right sided hydronephrosis. Resolved left hydronephrosis. Left inguinal hernia containing non-obstructed sigmoid colon, with mild adjacent stranding, correlate with tenderness and reducibility. Unchanged sclerotic osseous metastatic disease. Electronically Signed   By: Maurine Simmering M.D.   On: 06/15/2022 17:20   DG Chest Portable 1 View  Result Date: 06/15/2022 CLINICAL DATA:  Questionable sepsis - evaluate for abnormality EXAM: PORTABLE CHEST 1 VIEW COMPARISON:  None Available. FINDINGS: Left chest wall dual lead pacemaker and defibrillator in grossly similar position. The heart and mediastinal contours are unchanged. Aortic calcification. No focal consolidation. No pulmonary edema. No pleural effusion. No pneumothorax. No acute osseous abnormality. IMPRESSION: 1. No active disease. 2.  Aortic Atherosclerosis (ICD10-I70.0). Electronically Signed   By: Iven Finn M.D.   On: 06/15/2022 16:00     LOS: 1 day   Antonieta Pert, MD [facility]  06/16/2022, 10:55 AM

## 2022-06-16 NOTE — Progress Notes (Signed)
Patients BP soft at 2100. MD notified and one time dose midodrine added. Medication given. 2215 patients BP not improved 73/34 (46). Patient placed in trendelenburg. MD messaged again and LR bolus started. Will continue to monitor.

## 2022-06-16 NOTE — Consult Note (Signed)
Urology Consult   Physician requesting consult: Antonieta Pert  Reason for consult: UTI  History of Present Illness: Eric Krueger is a 76 y.o. is well known to me with most recent office note and data copied below:  Eric Krueger is a 76 year old male seen in followup with urinary retention and metastatic prostate cancer.   Of note, he has a past medical history of Alzheimer's dementia, atrial fibrillation s/p pacemaker and defibrillator. He is on Eliquis. He follows with his cardiologist, Dr. Tomie China.   1. Urinary retention: He presented to ED on 07/06/2021 and found to be in urinary retention and acute renal failure. Renal ultrasound demonstrated moderate bilateral hydronephrosis. Creatinine was 13. Foley catheter was placed with return of 750 cc urine. His creatinine down trended and was most recently 1.4 on 07/08/2021 at his baseline. Renal ultrasound 07/07/2021 demonstrated resolved hydronephrosis. He failed a void trial on 07/22/2021.  TRUS 08/10/2021 demonstrated 65cm^3 prostate  Cystoscopy was withheld given concern of possible UTI.  PSA on 07/29/2021 was 3.26  I did counsel regarding possibility of hypotonic bladder, however we elected not to proceed with UDS and discussed that TURP was best chance of being catheter free.  -S/p TURP and bladder biopsy 09/17/2021 with path GS 5+4=9 adenocarcinoma of the prostate also in the bladder trigone biopsy  -Failed multiple void trials. He declined urodynamics.  -He is now managed with a suprapubic tube, placed by interventional radiology on 04/14/2022. This is since come dislodged and he has now managed with indwelling Foley catheter. He would like to replace suprapubic tube and this will be arranged in the operating room.   2. Castrate sensitive metastatic prostate cancer:  -S/p TURP 09/17/2021 with GS 5+4=9 adenocarcinoma of the prostate also in the bladder trigone biopsy  -Pre-op PSA on 07/29/2021 was 3.26  -CT A/P 09/26/2021 performed in  [place] demonstrated pelvic lymphadenopathy and pelvic bony sclerotic lesions.  -PSMA PET scan 10/12/2021 with extensive nodal metastasis in the iliac nodes, periaortic retroperitoneal nodes and extending to the mediastinum. He has a tiny right supraclavicular metastatic node. He has extensive skeletal metastasis involving the pelvis, proximal femurs, spine and sternum.  -CT A/P 03/10/2022 with interval decrease in size of retroperitoneal and pelvic sidewall lymphadenopathy. Skeletal metastasis have become more densely cirrhotic suggesting healing.  -Treatment: ADT. He has declined oral antiandrogens and chemotherapy.  -'240mg'$  Firmagon given on 09/27/2021. 45 mg Eligard given on 11/02/2021, 05/02/2022.  -Most recent PSA: 12 on 04/26/2022 which represents a rise from 1.7 on 01/25/2022.  -He has seen Dr. Alen Blew and elected not to proceed with any chemotherapy or oral antiandrogens.  -He denies a FH of prostate cancer  -He denies bone pain. He has noticed about 7 pounds of weight loss over the past month. He is not eating as well. His wife has tried supplementing with Ensures.   #3. Bilateral ureteral stones:  -He was found to have bilateral ureteral stones and underwent bilateral stent placement on 05/10/2022 with Dr. Gloriann Loan.  -He is scheduled for bilateral ureteroscopy with suprapubic tube placement on 07/01/2022.   He presented to the ED today with complaints of confusion, weakness and hematuria.  There is some concern for UTI symptoms.  He does have a chronic indwelling Foley catheter.  He also reported diarrhea over the past several days.  His wife noted that he has had some decreased urine output over the past several days.  He received resuscitation in the emergency department and has since had adequate urine output.  His creatinine has also improved.  It has been a few weeks since his Foley catheter has been exchanged.  CT A/P 06/15/2022 demonstrated appropriate position of bilateral ureteral stents.   There was some fullness of the right-sided collecting system however there is no distal obstruction.  The bladder was decompressed with Foley catheter in place.   Today, he is seen without complaints.  He denies abdominal pain or flank pain.  He denies fevers or chills.  He is tolerating his Foley catheter.  Past Medical History:  Diagnosis Date   AKI (acute kidney injury) ([place]) 05/10/2022   Anticoagulant long-term use    eliquis--- managed by cardiology   BPH with urinary obstruction    urologist-- dr Layanna Charo   CHB (complete heart block) ([place])    CKD (chronic kidney disease), stage III ([place])    Dual implantable cardioverter-defibrillator in situ 03/2011   followed by cardiologist-- dr Rowland Lathe--   pt had DDD pacemaker and changed out for [facility] Jude)  Dual ICD 05/ 2012 for symptomatic polymorphic VT;  generator change 12/ 2016   Dyslipidemia    Early onset Alzheimer's dementia without behavioral disturbance [facility])    neurologist-- dr d. Bjorn Loser   Elevated troponin 05/11/2022   Foley catheter in place    History of gout    remote   History of pulmonary embolism 2011   per pt wife no previous blood clots prior to and none since 2011   Moderate tricuspid regurgitation    per last echo in care everywhere 05-18-2020  moderate to severe TR , RSVP 37-75mHg   Persistent atrial fibrillation ([place]    approx late 2011 to 01/ 2012 dx AFib  s/p DCCV 01/ 2012 unsuccessful   Sepsis ([place] 05/10/2022   Septic shock ([place] 05/11/2022   Sigmoid diverticulosis    SSS (sick sinus syndrome) ([place] 01/2011   03/ 2012 s/p  permanent pacemaker  then upgraded to dual ICD 05/ 2012 due to symptomatic VT   Urine retention 06/2021   has foley catheter   Ventricular tachycardia, 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[place] 03/2011   pt had recurrent syncopal episode, 03-29-2011 pacemaker intergatted in office baseline Afib and runs of NSVT which correlated with syncopal episodes  s/p removal pacemake and placement dual ICD    Past Surgical History:   Procedure Laterality Date   CARDIAC CATHETERIZATION  01/31/2011   '@[facility]'$ ;  no intracardiac shunting identified, mild volume expansion, normal pulmonary artery pressure, and adequate cardiac output   CARDIAC PACEMAKER PLACEMENT  01/2011   CARDIOVERSION  11/2010   unsuccessful   CATARACT EXTRACTION W/ INTRAOCULAR LENS IMPLANT Bilateral 2021   CYSTOSCOPY W/ URETERAL STENT PLACEMENT Bilateral 05/10/2022   Procedure: CYSTOSCOPY WITH RETROGRADE PYELOGRAM/URETERAL STENT PLACEMENT;  Surgeon: BLucas Mallow MD;  Location: [facility];  Service: Urology;  Laterality: Bilateral;   EP IMPLANTABLE DEVICE  04/04/2011   '@[facility]'$ ;  by dr m. drucker---  EP study with inducible polymorphic VT,  DDD pacemaker removed and  Implanted Dual ICD device  ([employer] Jude)   ICD GENERATOR CHANGE  11/10/2015   '@[facility]'$    TONSILLECTOMY  1952   age 76  TRANSURETHRAL RESECTION OF PROSTATE N/A 09/17/2021   Procedure: TRANSURETHRAL RESECTION OF THE PROSTATE (TURP), BIPOLAR, BLADDER BIOPSY;  Surgeon: GJanith Lima MD;  [facility]  Service: Urology;  Laterality: N/A;    Current Hospital Medications:  Home Meds:  No current facility-administered medications on file prior to encounter.   Current Outpatient Medications on File  Prior to Encounter  Medication Sig Dispense Refill   acetaminophen (TYLENOL) 500 MG tablet Take 500 mg by mouth every 6 (six) hours as needed for mild pain, headache or fever.     atorvastatin (LIPITOR) 10 MG tablet Take 10 mg by mouth at bedtime.     ELIQUIS 5 MG TABS tablet Take 1 tablet (5 mg total) by mouth 2 (two) times daily. 60 tablet 11   fenofibrate (TRICOR) 145 MG tablet Take 145 mg by mouth daily.     memantine (NAMENDA) 10 MG tablet Take 10 mg by mouth in the morning and at bedtime.     mirtazapine (REMERON) 30 MG tablet Take 30 mg by mouth at bedtime.     Multiple Vitamin (MULTIVITAMIN) tablet Take 1 tablet by mouth daily.     Omega-3 Fatty Acids (OMEGA-3 PO) Take 1  capsule by mouth daily.     tamsulosin (FLOMAX) 0.4 MG CAPS capsule Take 0.4 mg by mouth daily.     Trospium Chloride 60 MG CP24 Take 60 mg by mouth in the morning.       Scheduled Meds:  apixaban  5 mg Oral BID   atorvastatin  10 mg Oral QHS   Chlorhexidine Gluconate Cloth  6 each Topical Daily   fenofibrate  160 mg Oral Daily   memantine  10 mg Oral BID   midodrine  5 mg Oral TID WC   mirtazapine  30 mg Oral QHS   mouth rinse  15 mL Mouth Rinse 4 times per day   sodium chloride flush  3 mL Intravenous Q12H   Trospium Chloride  60 mg Oral Daily   Continuous Infusions:  cefTRIAXone (ROCEPHIN)  IV Stopped (06/16/22 0140)   lactated ringers 125 mL/hr at 06/16/22 1140   PRN Meds:.acetaminophen **OR** acetaminophen, mouth rinse, polyethylene glycol  Allergies:  Allergies  Allergen Reactions   Nsaids Other (See Comments)    Cannot take due to defibrillator/pacemaker   Saw [place] Diarrhea   Vytorin [Ezetimibe-Simvastatin] Other (See Comments)    Myalgias    Adhesive [Tape] Rash    Family History  Problem Relation Age of Onset   Diabetes Mellitus II Other     Social History:  reports that he has never smoked. He has never used smokeless tobacco. He reports that he does not currently use alcohol. He reports that he does not use drugs.  ROS: A complete review of systems was performed.  All systems are negative except for pertinent findings as noted.  Physical Exam:  Vital signs in last 24 hours: Temp:  [97.5 F (36.4 C)-98.3 F (36.8 C)] 98.2 F (36.8 C) (07/27 0800) Pulse Rate:  [79-86] 80 (07/27 1100) Resp:  [14-31] 16 (07/27 1100) BP: (72-149)/(33-139) 91/62 (07/27 1100) SpO2:  [92 %-100 %] 100 % (07/27 1100) Weight:  [70.8 kg] 70.8 kg (07/27 0500) Constitutional:  Alert and oriented, No acute distress Cardiovascular: Regular rate and rhythm Respiratory: Normal respiratory effort, Lungs clear bilaterally GI: Abdomen is soft, nontender, nondistended, no abdominal  masses GU: No CVA tenderness; foley in place with ventral urethral erosion, draining clear yellow urine Neurologic: Grossly intact, no focal deficits Psychiatric: Normal mood and affect  Laboratory Data:  Recent Labs    06/15/22 1516 06/16/22 0316  WBC 7.0 4.6  HGB 10.8* 8.8*  HCT 35.0* 28.7*  PLT 330 251    Recent Labs    06/15/22 1516 06/16/22 0316  NA 146* 143  K 4.5 3.7  CL 113* 110  GLUCOSE  133* 99  BUN 45* 41*  CALCIUM 10.1 9.0  CREATININE 2.14* 1.73*     Results for orders placed or performed during the hospital encounter of 06/15/22 (from the past 24 hour(s))  Blood culture (routine x 2)     Status: None (Preliminary result)   Collection Time: 06/15/22  3:00 PM   Specimen: BLOOD  Result Value Ref Range   Specimen Description      BLOOD RIGHT ANTECUBITAL Performed at [facility], [place] [address] [place], [place] [address]    Special Requests      BOTTLES DRAWN AEROBIC AND ANAEROBIC Blood Culture adequate volume Performed at [facility] [address], [place], [place] [address]    Culture      NO GROWTH < 12 HOURS Performed at [facility] [address] [place], [place] [address]    Report Status PENDING   Comprehensive metabolic panel     Status: Abnormal   Collection Time: 06/15/22  3:16 PM  Result Value Ref Range   Sodium 146 (H) 135 - 145 mmol/L   Potassium 4.5 3.5 - 5.1 mmol/L   Chloride 113 (H) 98 - 111 mmol/L   CO2 26 22 - 32 mmol/L   Glucose, Bld 133 (H) 70 - 99 mg/dL   BUN 45 (H) 8 - 23 mg/dL   Creatinine, Ser 2.14 (H) 0.61 - 1.24 mg/dL   Calcium 10.1 8.9 - 10.3 mg/dL   Total Protein 7.0 6.5 - 8.1 g/dL   Albumin 3.0 (L) 3.5 - 5.0 g/dL   AST 30 15 - 41 U/L   ALT 14 0 - 44 U/L   Alkaline Phosphatase 77 38 - 126 U/L   Total Bilirubin 0.8 0.3 - 1.2 mg/dL   GFR, Estimated 31 (L) >60 mL/min   Anion gap 7 5 - 15  CBC with Differential     Status: Abnormal   Collection Time: 06/15/22  3:16 PM   Result Value Ref Range   WBC 7.0 4.0 - 10.5 K/uL   RBC 3.89 (L) 4.22 - 5.81 MIL/uL   Hemoglobin 10.8 (L) 13.0 - 17.0 g/dL   HCT 35.0 (L) 39.0 - 52.0 %   MCV 90.0 80.0 - 100.0 fL   MCH 27.8 26.0 - 34.0 pg   MCHC 30.9 30.0 - 36.0 g/dL   RDW 15.5 11.5 - 15.5 %   Platelets 330 150 - 400 K/uL   nRBC 0.0 0.0 - 0.2 %   Neutrophils Relative % 81 %   Neutro Abs 5.7 1.7 - 7.7 K/uL   Lymphocytes Relative 9 %   Lymphs Abs 0.7 0.7 - 4.0 K/uL   Monocytes Relative 8 %   Monocytes Absolute 0.5 0.1 - 1.0 K/uL   Eosinophils Relative 1 %   Eosinophils Absolute 0.1 0.0 - 0.5 K/uL   Basophils Relative 1 %   Basophils Absolute 0.0 0.0 - 0.1 K/uL   Immature Granulocytes 0 %   Abs Immature Granulocytes 0.02 0.00 - 0.07 K/uL  Lactic acid, plasma     Status: None   Collection Time: 06/15/22  3:16 PM  Result Value Ref Range   Lactic Acid, Venous 1.8 0.5 - 1.9 mmol/L  Protime-INR     Status: Abnormal   Collection Time: 06/15/22  3:16 PM  Result Value Ref Range   Prothrombin Time 22.4 (H) 11.4 - 15.2 seconds   INR 2.0 (H) 0.8 - 1.2  APTT     Status: None  Collection Time: 06/15/22  3:16 PM  Result Value Ref Range   aPTT 34 24 - 36 seconds  Blood culture (routine x 2)     Status: None (Preliminary result)   Collection Time: 06/15/22  3:45 PM   Specimen: BLOOD RIGHT ARM  Result Value Ref Range   Specimen Description      BLOOD RIGHT ARM Performed at [facility], [place] [address], [place], [place] [address]    Special Requests      BOTTLES DRAWN AEROBIC AND ANAEROBIC Blood Culture adequate volume Performed at [facility] [address] [place], [place] [address]    Culture      NO GROWTH < 12 HOURS Performed at [facility] [address] [place], [place] [address]    Report Status PENDING   Urinalysis, Routine w reflex microscopic Urine, Clean Catch     Status: Abnormal   Collection Time: 06/15/22  4:35 PM  Result Value Ref Range   Color, Urine AMBER (A)  YELLOW   APPearance TURBID (A) CLEAR   Specific Gravity, Urine 1.015 1.005 - 1.030   pH 8.0 5.0 - 8.0   Glucose, UA NEGATIVE NEGATIVE mg/dL   Hgb urine dipstick MODERATE (A) NEGATIVE   Bilirubin Urine NEGATIVE NEGATIVE   Ketones, ur NEGATIVE NEGATIVE mg/dL   Protein, ur >=300 (A) NEGATIVE mg/dL   Nitrite NEGATIVE NEGATIVE   Leukocytes,Ua LARGE (A) NEGATIVE   RBC / HPF >50 (H) 0 - 5 RBC/hpf   WBC, UA >50 (H) 0 - 5 WBC/hpf   Bacteria, UA MANY (A) NONE SEEN   WBC Clumps PRESENT    Mucus PRESENT    Hyaline Casts, UA PRESENT    Triple Phosphate Crystal PRESENT   Lactic acid, plasma     Status: None   Collection Time: 06/15/22  4:42 PM  Result Value Ref Range   Lactic Acid, Venous 1.2 0.5 - 1.9 mmol/L  MRSA Next Gen by PCR, Nasal     Status: None   Collection Time: 06/15/22 11:24 PM   Specimen: Nasal Mucosa; Nasal Swab  Result Value Ref Range   MRSA by PCR Next Gen NOT DETECTED NOT DETECTED  Comprehensive metabolic panel     Status: Abnormal   Collection Time: 06/16/22  3:16 AM  Result Value Ref Range   Sodium 143 135 - 145 mmol/L   Potassium 3.7 3.5 - 5.1 mmol/L   Chloride 110 98 - 111 mmol/L   CO2 25 22 - 32 mmol/L   Glucose, Bld 99 70 - 99 mg/dL   BUN 41 (H) 8 - 23 mg/dL   Creatinine, Ser 1.73 (H) 0.61 - 1.24 mg/dL   Calcium 9.0 8.9 - 10.3 mg/dL   Total Protein 5.4 (L) 6.5 - 8.1 g/dL   Albumin 2.4 (L) 3.5 - 5.0 g/dL   AST 25 15 - 41 U/L   ALT 10 0 - 44 U/L   Alkaline Phosphatase 67 38 - 126 U/L   Total Bilirubin 0.4 0.3 - 1.2 mg/dL   GFR, Estimated 40 (L) >60 mL/min   Anion gap 8 5 - 15  CBC     Status: Abnormal   Collection Time: 06/16/22  3:16 AM  Result Value Ref Range   WBC 4.6 4.0 - 10.5 K/uL   RBC 3.16 (L) 4.22 - 5.81 MIL/uL   Hemoglobin 8.8 (L) 13.0 - 17.0 g/dL   HCT 28.7 (L) 39.0 - 52.0 %   MCV 90.8 80.0 -  100.0 fL   MCH 27.8 26.0 - 34.0 pg   MCHC 30.7 30.0 - 36.0 g/dL   RDW 15.4 11.5 - 15.5 %   Platelets 251 150 - 400 K/uL   nRBC 0.0 0.0 - 0.2 %    Recent Results (from the past 240 hour(s))  Blood culture (routine x 2)     Status: None (Preliminary result)   Collection Time: 06/15/22  3:00 PM   Specimen: BLOOD  Result Value Ref Range Status   Specimen Description   Final    BLOOD RIGHT ANTECUBITAL Performed at [facility], [place] [address] [place], [place] [address]    Special Requests   Final    BOTTLES DRAWN AEROBIC AND ANAEROBIC Blood Culture adequate volume Performed at [facility] [address], [place], [place] [address]    Culture   Final    NO GROWTH < 12 HOURS Performed at [facility] [address] [place], [place] [address]    Report Status PENDING  Incomplete  Blood culture (routine x 2)     Status: None (Preliminary result)   Collection Time: 06/15/22  3:45 PM   Specimen: BLOOD RIGHT ARM  Result Value Ref Range Status   Specimen Description   Final    BLOOD RIGHT ARM Performed at [facility], [place] [address] [place], [place] [address]    Special Requests   Final    BOTTLES DRAWN AEROBIC AND ANAEROBIC Blood Culture adequate volume Performed at [facility] [address], [place], [place] [address]    Culture   Final    NO GROWTH < 12 HOURS Performed at [facility] [address], [place],  [address]    Report Status PENDING  Incomplete  MRSA Next Gen by PCR, Nasal     Status: None   Collection Time: 06/15/22 11:24 PM   Specimen: Nasal Mucosa; Nasal Swab  Result Value Ref Range Status   MRSA by PCR Next Gen NOT DETECTED NOT DETECTED Final    Comment: (NOTE) The GeneXpert MRSA Assay (FDA approved for NASAL specimens only), is one component of a comprehensive MRSA colonization surveillance program. It is not intended to diagnose MRSA infection nor to guide or monitor treatment for MRSA infections. Test performance is not FDA approved in patients less than 77 years old. Performed at [facility], [place] [address], [place],  [address]     Renal Function: Recent Labs    06/15/22 1516 06/16/22 0316  CREATININE 2.14* 1.73*   Estimated Creatinine Clearance: 35.2 mL/min (A) (by C-G formula based on SCr of 1.73 mg/dL (H)).  Radiologic Imaging: CT Renal Stone Study  Result Date: 06/15/2022 CLINICAL DATA:  Foreign body, GU tract Patient stents in place. History of obstructive uropathy EXAM: CT ABDOMEN AND PELVIS WITHOUT CONTRAST TECHNIQUE: Multidetector CT imaging of the abdomen and pelvis was performed following the standard protocol without IV contrast. RADIATION DOSE REDUCTION: This exam was performed according to the departmental dose-optimization program which includes automated exposure control, adjustment of the mA and/or kV according to patient size and/or use of iterative reconstruction technique. COMPARISON:  CT 05/14/2022. FINDINGS: Lower chest: Bibasilar scarring/linear atelectasis. Mild cardiomegaly with pacemaker/AICD leads in the right atrium and right ventricle. Hepatobiliary: No focal liver abnormality is seen. The gallbladder is unremarkable. Pancreas: Unremarkable. No pancreatic ductal dilatation or surrounding inflammatory changes. Spleen: Normal in size without focal abnormality. Adrenals/Urinary Tract: Adrenal glands are unremarkable.  Atrophic left kidney. There are bilateral nephroureteral stents in place there is increased prominence of right renal collecting system and proximal ureter with decompression distally. No left-sided hydroureteronephrosis. The bladder is decompressed with Foley catheter in place. Stomach/Bowel: Mild distention of the stomach filled with fluid. There is no evidence of bowel obstruction.The appendix is normal. Moderate stool burden in the colon. Vascular/Lymphatic: Aortoiliac atherosclerosis. No AAA. No lymphadenopathy. Reproductive: Unremarkable. Other: There is a left inguinal hernia containing nonobstructed sigmoid colon. Fat  containing right inguinal hernia. There is minimal stranding on the left. Musculoskeletal: Similar distribution of osseous sclerotic metastatic disease, particularly in the pelvis. No acute osseous abnormality. IMPRESSION: Bilateral nephroureteral stents are in place. Increased right sided hydronephrosis. Resolved left hydronephrosis. Left inguinal hernia containing non-obstructed sigmoid colon, with mild adjacent stranding, correlate with tenderness and reducibility. Unchanged sclerotic osseous metastatic disease. Electronically Signed   By: Maurine Simmering M.D.   On: 06/15/2022 17:20   DG Chest Portable 1 View  Result Date: 06/15/2022 CLINICAL DATA:  Questionable sepsis - evaluate for abnormality EXAM: PORTABLE CHEST 1 VIEW COMPARISON:  None Available. FINDINGS: Left chest wall dual lead pacemaker and defibrillator in grossly similar position. The heart and mediastinal contours are unchanged. Aortic calcification. No focal consolidation. No pulmonary edema. No pleural effusion. No pneumothorax. No acute osseous abnormality. IMPRESSION: 1. No active disease. 2.  Aortic Atherosclerosis (ICD10-I70.0). Electronically Signed   By: Iven Finn M.D.   On: 06/15/2022 16:00    I independently reviewed the above imaging studies.  Impression/Recommendation  #1.  UTI: It was found to have altered mental status in setting of chronic Foley catheter.  Urine culture pending.  CT A/P 06/15/2022 with right ureteral stents in appropriate position. #2.  Bilateral ureteral stones: He is s/p bilateral ureteral stents.  He has upcoming ureteroscopy with laser lithotripsy scheduled for 07/01/2022. #3.  Urinary retention: This is currently managed with indwelling Foley catheter.  He has upcoming surgery for suprapubic tube placement on 07/01/2022  -Patient seen evaluated today and has improved since resuscitation in the emergency department.  Urine culture and blood culture are pending.  Continue antibiotics. -I recommend  Foley catheter exchange.  Discussed with nursing. -I reviewed CT scan with stents in appropriate position.  There is mild fullness of the right collecting system however this does not appear to be obstructed as this dissipates along the course of his right ureter.  No need for stent exchange presently. -He is tentatively scheduled for bilateral ureteroscopy with laser lithotripsy scheduled for 07/01/2022. -Discussed with his wife at bedside today.   Matt R. Julina Altmann MD 06/16/2022, 12:04 PM  [facility]  Pager: [phone]   CC: Antonieta Pert

## 2022-06-16 NOTE — Hospital Course (Addendum)
76 y.o.m w/ history significant of BPH with LUTS, obstructive uropathy, prostate cancer, anemia, atrial fibrillation, complete heart block, sick sinus syndrome, status post AICD, chronic pain, hyperlipidemia, gout, pulmonary embolus, dementia/mild cognitive impairment, diverticulosis presented with weakness, blood in urine, and confusion episode in morning where he thought he was in [place], had malodorous diarrhea, decreased urinary output of catheter today with some sludge.  They called her urologist who recommended ED evaluation.Recently admitted for sepsis UTI pressures, bilateral stent by urology in June.  In [facility]:NOBSJ BP in the 62E to 366 systolic with intermittent low maps around 65, creatinine 2.14 from baseline 1.3,  hb 10.8. PT and INR stably elevated at 20.4 and 2.0 respectively.  Lactic acid normal x2.Urine culture and blood culture sent , CXR-no acute normality.CT renal stone study with bilateral nephroureteral stent in place with increased right hydronephrosis also noted was left inguinal hernia with nonobstructed small bowel contained within.Stable osseous metastatic disease also noted.Urology consulted, admitted for further management.  Foley catheterization 7/27. 7/27 night-7/28 am-significant events with persistent hypotension needing multiple IV fluid boluses, hypoxia-episodes-placed on nonrebreather, multiple attempts to reach spouse unable, son was contacted and he was also trying to reach patient's spouse as well

## 2022-06-16 NOTE — Progress Notes (Signed)
Initial Nutrition Assessment  DOCUMENTATION CODES:   Severe malnutrition in context of chronic illness  INTERVENTION:   -Ensure Plus High Protein po BID, each supplement provides 350 kcal and 20 grams of protein.   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  GOAL:   Patient will meet greater than or equal to 90% of their needs  MONITOR:   PO intake, Supplement acceptance, Labs, Weight trends, I & O's  REASON FOR ASSESSMENT:   Malnutrition Screening Tool    ASSESSMENT:   76 y.o.m w/ history significant of BPH with LUTS, obstructive uropathy, prostate cancer, anemia, atrial fibrillation, complete heart block, sick sinus syndrome, status post AICD, chronic pain, hyperlipidemia, gout, pulmonary embolus, dementia/mild cognitive impairment, diverticulosis presented with weakness, blood in urine, and confusion episode in morning where he thought he was in [place], had malodorous diarrhea, decreased urinary output of catheter today with some sludge. Admitted for UTI.  Patient in room, wife at bedside, she is assisting him with lunch. He is attempting to eat grilled cheese sandwich but is having difficulty with chewing and swallowing. Pt reports dry mouth. Given pt's dementia, pt's wife states he has good and bad days. At home they have started making him some protein shakes with protein powders and pt has been drinking Ensure for a while now. RD has placed order for Ensure and daily MVI. RD provided Ensure and ginger ales for patient prior to end of visit.   Per weight records, pt has lost 15 lbs since 6/26 (8% wt loss x 1 month, significant for time frame).   Medications: Remeron, Lactated ringers  Labs reviewed.  NUTRITION - FOCUSED PHYSICAL  EXAM:  Flowsheet Row Most Recent Value  Orbital Region Severe depletion  Upper Arm Region Severe depletion  Thoracic and Lumbar Region Severe depletion  Buccal Region Severe depletion  Temple Region Severe depletion  Clavicle Bone Region Severe depletion  Clavicle and Acromion Bone Region Severe depletion  Scapular Bone Region Severe depletion  Dorsal Hand Unable to assess  Patellar Region Unable to assess  Anterior Thigh Region Unable to assess  Posterior Calf Region Unable to assess  Edema (RD Assessment) None  Hair Reviewed  Eyes Reviewed  Mouth Reviewed  [reports dry mouth]       Diet Order:   Diet Order             Diet regular Room service appropriate? Yes; Fluid consistency: Thin  Diet effective now                   EDUCATION NEEDS:   No education needs have been identified at this time  Skin:  Skin Assessment: Reviewed RN Assessment  Last BM:  7/27 -type 7  Height:   Ht Readings from Last 1 Encounters:  06/16/22 '6\' 2"'$  (1.88 m)    Weight:   Wt Readings from Last 1 Encounters:  06/16/22 70.8 kg    BMI:  Body mass index is 20.04 kg/m.  Estimated Nutritional Needs:   Kcal:  2100-2300  Protein:  105-115g  Fluid:  2.1L/day   Eric Bibles, MS, RD, LDN Inpatient Clinical Dietitian Contact information available via Amion

## 2022-06-17 ENCOUNTER — Encounter (HOSPITAL_COMMUNITY): Payer: Self-pay | Admitting: Internal Medicine

## 2022-06-17 DIAGNOSIS — R531 Weakness: Secondary | ICD-10-CM

## 2022-06-17 DIAGNOSIS — G3184 Mild cognitive impairment, so stated: Secondary | ICD-10-CM | POA: Diagnosis not present

## 2022-06-17 DIAGNOSIS — E43 Unspecified severe protein-calorie malnutrition: Secondary | ICD-10-CM

## 2022-06-17 DIAGNOSIS — Z515 Encounter for palliative care: Secondary | ICD-10-CM

## 2022-06-17 DIAGNOSIS — T83511A Infection and inflammatory reaction due to indwelling urethral catheter, initial encounter: Secondary | ICD-10-CM | POA: Diagnosis not present

## 2022-06-17 DIAGNOSIS — Z7189 Other specified counseling: Secondary | ICD-10-CM

## 2022-06-17 DIAGNOSIS — N390 Urinary tract infection, site not specified: Secondary | ICD-10-CM | POA: Diagnosis not present

## 2022-06-17 LAB — GASTROINTESTINAL PANEL BY PCR, STOOL (REPLACES STOOL CULTURE)

## 2022-06-17 LAB — COMPREHENSIVE METABOLIC PANEL
ALT: 11 U/L (ref 0–44)
AST: 26 U/L (ref 15–41)
Albumin: 2 g/dL — ABNORMAL LOW (ref 3.5–5.0)
Alkaline Phosphatase: 60 U/L (ref 38–126)
Anion gap: 4 — ABNORMAL LOW (ref 5–15)
BUN: 33 mg/dL — ABNORMAL HIGH (ref 8–23)
CO2: 22 mmol/L (ref 22–32)
Calcium: 9.1 mg/dL (ref 8.9–10.3)
Chloride: 119 mmol/L — ABNORMAL HIGH (ref 98–111)
Creatinine, Ser: 1.55 mg/dL — ABNORMAL HIGH (ref 0.61–1.24)
GFR, Estimated: 46 mL/min — ABNORMAL LOW (ref >60)
Glucose, Bld: 137 mg/dL — ABNORMAL HIGH (ref 70–99)
Potassium: 3.5 mmol/L (ref 3.5–5.1)
Sodium: 145 mmol/L (ref 135–145)
Total Bilirubin: 0.2 mg/dL — ABNORMAL LOW (ref 0.3–1.2)
Total Protein: 4.7 g/dL — ABNORMAL LOW (ref 6.5–8.1)

## 2022-06-17 LAB — CBC
HCT: 25.9 % — ABNORMAL LOW (ref 39.0–52.0)
Hemoglobin: 8.1 g/dL — ABNORMAL LOW (ref 13.0–17.0)
MCH: 28.4 pg (ref 26.0–34.0)
MCHC: 31.3 g/dL (ref 30.0–36.0)
MCV: 90.9 fL (ref 80.0–100.0)
Platelets: 246 10*3/uL (ref 150–400)
RBC: 2.85 MIL/uL — ABNORMAL LOW (ref 4.22–5.81)
RDW: 15.6 % — ABNORMAL HIGH (ref 11.5–15.5)
WBC: 3.8 10*3/uL — ABNORMAL LOW (ref 4.0–10.5)
nRBC: 0 % (ref 0.0–0.2)

## 2022-06-17 LAB — CK TOTAL AND CKMB (NOT AT ARMC)
CK, MB: 1.8 ng/mL (ref 0.5–5.0)
Relative Index: INVALID (ref 0.0–2.5)
Total CK: 43 U/L — ABNORMAL LOW (ref 49–397)

## 2022-06-17 LAB — TROPONIN I (HIGH SENSITIVITY): Troponin I (High Sensitivity): 60 ng/L — ABNORMAL HIGH (ref <18)

## 2022-06-17 LAB — CORTISOL: Cortisol, Plasma: 11.2 ug/dL

## 2022-06-17 MED ORDER — MIDODRINE HCL 5 MG PO TABS
10.0000 mg | ORAL_TABLET | Freq: Three times a day (TID) | ORAL | Status: DC
  Administered 2022-06-17 – 2022-06-18 (×5): 10 mg via ORAL
  Filled 2022-06-17 (×5): qty 2

## 2022-06-17 MED ORDER — LACTATED RINGERS IV BOLUS
500.0000 mL | Freq: Once | INTRAVENOUS | Status: AC
Start: 2022-06-17 — End: 2022-06-17
  Administered 2022-06-17: 500 mL via INTRAVENOUS

## 2022-06-17 MED ORDER — CEFAZOLIN SODIUM-DEXTROSE 2-4 GM/100ML-% IV SOLN
2.0000 g | Freq: Two times a day (BID) | INTRAVENOUS | Status: DC
  Administered 2022-06-17 – 2022-06-18 (×2): 2 g via INTRAVENOUS
  Filled 2022-06-17 (×4): qty 100

## 2022-06-17 MED ORDER — SODIUM CHLORIDE 0.9 % IV BOLUS
500.0000 mL | Freq: Once | INTRAVENOUS | Status: AC
  Administered 2022-06-17: 500 mL via INTRAVENOUS

## 2022-06-17 NOTE — Patient Instructions (Addendum)
DUE TO COVID-19 ONLY TWO VISITORS  (aged 78 and older)  ARE ALLOWED TO COME WITH YOU AND STAY IN THE WAITING ROOM ONLY DURING PRE OP AND PROCEDURE.   **NO VISITORS ARE ALLOWED IN THE SHORT STAY AREA OR RECOVERY ROOM!!**  IF YOU WILL BE ADMITTED INTO THE HOSPITAL YOU ARE ALLOWED ONLY FOUR SUPPORT PEOPLE DURING VISITATION HOURS ONLY (7 AM -8PM)   The support person(s) must pass our screening, gel in and out, and wear a mask at all times, including in the patient's room. Patients must also wear a mask when staff or their support person are in the room. Visitors GUEST BADGE MUST BE WORN VISIBLY  One adult visitor may remain with you overnight and MUST be in the room by 8 P.M.     Your procedure is scheduled on: 07/01/22   Report to [facility] Main Entrance    Report to admitting at : 5:30 AM   Call this number if you have problems the morning of surgery [phone]   Do not eat food :After Midnight.  FOLLOW BOWEL PREP AND ANY ADDITIONAL PRE OP INSTRUCTIONS YOU RECEIVED FROM YOUR SURGEON'S OFFICE!!!   Oral Hygiene is also important to reduce your risk of infection.                                    Remember - BRUSH YOUR TEETH THE MORNING OF SURGERY WITH YOUR REGULAR TOOTHPASTE   Do NOT smoke after Midnight   Take these medicines the morning of surgery with A SIP OF WATER: N/A  DO NOT TAKE ANY ORAL DIABETIC MEDICATIONS DAY OF YOUR SURGERY  Bring CPAP mask and tubing day of surgery.                              You may not have any metal on your body including hair pins, jewelry, and body piercing             Do not wear lotions, powders, perfumes/cologne, or deodorant              Men may shave face and neck.   Do not bring valuables to the hospital. [place].   Contacts, dentures or bridgework may not be worn into surgery.   Bring small overnight bag day of surgery.   DO NOT [place]. PHARMACY WILL DISPENSE MEDICATIONS LISTED ON YOUR MEDICATION LIST TO YOU DURING YOUR ADMISSION [place]!    Patients discharged on the day of surgery will not be allowed to drive home.  Someone NEEDS to stay with you for the first 24 hours after anesthesia.   Special Instructions: Bring a copy of your healthcare power of attorney and living will documents         the day of surgery if you haven't scanned them before.              Please read over the following fact sheets you were given: IF YOU HAVE QUESTIONS ABOUT YOUR PRE-OP INSTRUCTIONS PLEASE CALL [phone]     [facility] - Preparing for Surgery Before surgery, you can play an important role.  Because skin is not sterile, your skin needs to be as free of  germs as possible.  You can reduce the number of germs on your skin by washing with CHG (chlorahexidine gluconate) soap before surgery.  CHG is an antiseptic cleaner which kills germs and bonds with the skin to continue killing germs even after washing. Please DO NOT use if you have an allergy to CHG or antibacterial soaps.  If your skin becomes reddened/irritated stop using the CHG and inform your nurse when you arrive at Short Stay. Do not shave (including legs and underarms) for at least 48 hours prior to the first CHG shower.  You may shave your face/neck. Please follow these instructions carefully:  1.  Shower with CHG Soap the night before surgery and the  morning of Surgery.  2.  If you choose to wash your hair, wash your hair first as usual with your  normal  shampoo.  3.  After you shampoo, rinse your hair and body thoroughly to remove the  shampoo.                           4.  Use CHG as you would any other liquid soap.  You can apply chg directly  to the skin and wash                       Gently with a scrungie or clean washcloth.  5.  Apply the CHG Soap to your body ONLY FROM THE NECK DOWN.   Do not use on face/ open                           Wound or open  sores. Avoid contact with eyes, ears mouth and genitals (private parts).                       Wash face,  Genitals (private parts) with your normal soap.             6.  Wash thoroughly, paying special attention to the area where your surgery  will be performed.  7.  Thoroughly rinse your body with warm water from the neck down.  8.  DO NOT shower/wash with your normal soap after using and rinsing off  the CHG Soap.                9.  Pat yourself dry with a clean towel.            10.  Wear clean pajamas.            11.  Place clean sheets on your bed the night of your first shower and do not  sleep with pets. Day of Surgery : Do not apply any lotions/deodorants the morning of surgery.  Please wear clean clothes to the hospital/surgery center.  FAILURE TO FOLLOW THESE INSTRUCTIONS MAY RESULT IN THE CANCELLATION OF YOUR SURGERY PATIENT SIGNATURE_________________________________  NURSE SIGNATURE__________________________________  ________________________________________________________________________

## 2022-06-17 NOTE — Progress Notes (Signed)
[employer] Attempted to notify spouse earlier regarding patient's low bp, but no response.  Have given additional dose of midodrine '5mg'$  x1 without improvement.   Will try to additional Bolus with LR iv, to see if this improves bp and labs ordered.

## 2022-06-17 NOTE — Progress Notes (Signed)
Patient's BP soft throughout the night. MD Jani Gravel aware. Total 500 LR bolus x2 given. 500 NS bolus given x1. After NS bolus BP was 100/39. This was the best recorded BP. 15 minutes later BP dropped again to 94/39 and continued to drop. Current BP 96/38. Patient has started to have more frequent dysrhythmias. Frequent apnea periods SpO2 dropped to 78-85. NRB placed on patient.  Cardiac labs and EKG were completed between 0100-0200. MD notified again. Patient is in trendelenburg. Family is aware. This RN spoke to Hulan Fess and gave an update. He is still trying to reach the patient's spouse. This Programme researcher, broadcasting/film/video attempted to contact the spouse throughout the night with updates. Will pass along information to dayshift.

## 2022-06-17 NOTE — Progress Notes (Signed)
Subjective: Denies pain. Non nausea or emesis. Tolerating foley - foley exchanged yesterday. Some hypotension overnight and received fluid resuscitation and midodrine. BP in room this AM systolic in the 099I.  Objective: Vital signs in last 24 hours: Temp:  [97.9 F (36.6 C)-98.5 F (36.9 C)] 98.5 F (36.9 C) (07/28 0328) Pulse Rate:  [66-85] 80 (07/28 0645) Resp:  [12-30] 26 (07/28 0645) BP: (55-180)/(25-129) 145/40 (07/28 0645) SpO2:  [79 %-100 %] 94 % (07/28 0645) Weight:  [74.9 kg] 74.9 kg (07/28 0327)  Intake/Output from previous day: 07/27 0701 - 07/28 0700 In: 3783 [P.O.:250; I.V.:2470.4; IV Piggyback:1062.5] Out: 2775 [Urine:2775] Intake/Output this shift: No intake/output data recorded. UOP: 2.7L amber  Physical Exam:  General: Alert and oriented CV: RRR Lungs: Clear Abdomen: Soft, ND, NT Ext: NT, No erythema  Lab Results: Recent Labs    06/15/22 1516 06/16/22 0316 06/17/22 0112  HGB 10.8* 8.8* 8.1*  HCT 35.0* 28.7* 25.9*   BMET Recent Labs    06/16/22 0316 06/17/22 0112  NA 143 145  K 3.7 3.5  CL 110 119*  CO2 25 22  GLUCOSE 99 137*  BUN 41* 33*  CREATININE 1.73* 1.55*  CALCIUM 9.0 9.1     Studies/Results: CT Renal Stone Study  Result Date: 06/15/2022 CLINICAL DATA:  Foreign body, GU tract Patient stents in place. History of obstructive uropathy EXAM: CT ABDOMEN AND PELVIS WITHOUT CONTRAST TECHNIQUE: Multidetector CT imaging of the abdomen and pelvis was performed following the standard protocol without IV contrast. RADIATION DOSE REDUCTION: This exam was performed according to the departmental dose-optimization program which includes automated exposure control, adjustment of the mA and/or kV according to patient size and/or use of iterative reconstruction technique. COMPARISON:  CT 05/14/2022. FINDINGS: Lower chest: Bibasilar scarring/linear atelectasis. Mild cardiomegaly with pacemaker/AICD leads in the right atrium and right ventricle.  Hepatobiliary: No focal liver abnormality is seen. The gallbladder is unremarkable. Pancreas: Unremarkable. No pancreatic ductal dilatation or surrounding inflammatory changes. Spleen: Normal in size without focal abnormality. Adrenals/Urinary Tract: Adrenal glands are unremarkable. Atrophic left kidney. There are bilateral nephroureteral stents in place there is increased prominence of right renal collecting system and proximal ureter with decompression distally. No left-sided hydroureteronephrosis. The bladder is decompressed with Foley catheter in place. Stomach/Bowel: Mild distention of the stomach filled with fluid. There is no evidence of bowel obstruction.The appendix is normal. Moderate stool burden in the colon. Vascular/Lymphatic: Aortoiliac atherosclerosis. No AAA. No lymphadenopathy. Reproductive: Unremarkable. Other: There is a left inguinal hernia containing nonobstructed sigmoid colon. Fat containing right inguinal hernia. There is minimal stranding on the left. Musculoskeletal: Similar distribution of osseous sclerotic metastatic disease, particularly in the pelvis. No acute osseous abnormality. IMPRESSION: Bilateral nephroureteral stents are in place. Increased right sided hydronephrosis. Resolved left hydronephrosis. Left inguinal hernia containing non-obstructed sigmoid colon, with mild adjacent stranding, correlate with tenderness and reducibility. Unchanged sclerotic osseous metastatic disease. Electronically Signed   By: Maurine Simmering M.D.   On: 06/15/2022 17:20   DG Chest Portable 1 View  Result Date: 06/15/2022 CLINICAL DATA:  Questionable sepsis - evaluate for abnormality EXAM: PORTABLE CHEST 1 VIEW COMPARISON:  None Available. FINDINGS: Left chest wall dual lead pacemaker and defibrillator in grossly similar position. The heart and mediastinal contours are unchanged. Aortic calcification. No focal consolidation. No pulmonary edema. No pleural effusion. No pneumothorax. No acute osseous  abnormality. IMPRESSION: 1. No active disease. 2.  Aortic Atherosclerosis (ICD10-I70.0). Electronically Signed   By: Iven Finn M.D.   On: 06/15/2022 16:00  Assessment/Plan: #1.  UTI: It was found to have altered mental status in setting of chronic Foley catheter.  Urine culture >100K GNR, sensitivities pending. CT A/P 06/15/2022 with right ureteral stents in appropriate position. #2.  Bilateral ureteral stones: He is s/p bilateral ureteral stents 05/10/2022.  He has upcoming ureteroscopy with laser lithotripsy scheduled for 07/01/2022. #3.  Urinary retention: This is currently managed with indwelling Foley catheter.  He has upcoming surgery for suprapubic tube placement on 07/01/2022  -F/u urine cx and blood cx data. Ucx growing >100K GNR, sensitivities pending. Continue broad spectrum abx. Wean as able. -Foley catheter exchanged yesterday. Amber in color this AM. A mild amount of hematuria is expected given stents and foley in place. -Stents in appropriate position on CT with mild fullness in the right collecting system. No plan for exchange presently. -Creatinine continues to downtrend. -He is tentatively scheduled for bilateral ureteroscopy with laser lithotripsy scheduled for 07/01/2022. -Following peripherally. Please call with questions.   LOS: 2 days   Matt R. Shonya Sumida MD 06/17/2022, 7:11 AM [facility]  Pager: [phone]

## 2022-06-17 NOTE — Consult Note (Signed)
Consultation Note Date: 06/17/2022   Patient Name: Eric Krueger  DOB: 01/28/1946  MRN: [id number]  Age / Sex: 76 y.o., male  PCP: Joneen Boers, MD Referring Physician: Antonieta Pert, MD  Reason for Consultation: Establishing goals of care  HPI/Patient Profile: 76 y.o. male  admitted on 06/15/2022    Clinical Assessment and Goals of Care: 76 year old gentleman who lives at home with his wife in [place], [place], life limiting illness of metastatic prostate cancer, history of Alzheimer's dementia atrial fibrillation status post pacemaker and defibrillator.  Patient follows with urology in the outpatient setting. Patient presented to the emergency department with confusion weakness and hematuria.  He has chronic indwelling Foley catheter.  Also had diarrhea over the past several days.  Patient was given IV fluids and started on antibiotics.  CT scan showed appropriate position of bilateral ureteral stents bladder was decompressed with Foley catheter.  Urology is following, patient is on antibiotics.  Palliative medicine team consulted for ongoing goals of care discussions. Patient is known to palliative medicine service, seen in a previous hospitalization.  Palliative consult received, chart reviewed, patient seen, discussed with wife who was at bedside. Palliative medicine is specialized medical care for people living with serious illness. It focuses on providing relief from the symptoms and stress of a serious illness. The goal is to improve quality of life for both the patient and the family. Goals of care: Broad aims of medical therapy in relation to the patient's values and preferences. Our aim is to provide medical care aimed at enabling patients to achieve the goals that matter most to them, given the circumstances of their particular medical situation and their constraints.     NEXT OF KIN Wife  Eric Krueger.  They live in [place].   SUMMARY OF RECOMMENDATIONS   DNR Goals of care discussions undertaken with patient's wife present at the bedside: Their goals are for the patient to be at home as soon as possible.  Patient already has home health care and caregiver support as well as physical therapy services available at home.  Because of his history of dementia, patient's wife states that he does better in the home setting rather than in the hospital.  We discussed about tenuous blood pressures and pressors.  Patient required pressors in a previous hospitalization.  Patient is asymptomatic, denies chest pain is not short of breath is not confused or dizzy.  Wife states that his blood pressure fluctuates.  She wishes to get him home.  She is accepting of palliative services at home. As per discussions with [facility] MD, plan is to monitor the patient in the hospital for the next 24 hours and PMT would recommend home with continuation of home health care home PT and addition of home-based palliative support upon discharge. Thank you for the consult.  Code Status/Advance Care Planning: DNR   Symptom Management:     Palliative Prophylaxis:  Delirium Protocol  Psycho-social/Spiritual:  Desire for further Chaplaincy support:yes Additional Recommendations: Caregiving  Support/Resources  Prognosis:  < 12 months  Discharge Planning: Home with Palliative Services      Primary Diagnoses: Present on Admission:  Benign non-nodular prostatic hyperplasia with lower urinary tract symptoms  Chronic pain disorder  Complete heart block (HCC)  Atrial fibrillation, chronic (HCC)  Anemia  Obstructive uropathy  Dual implantable cardioverter-defibrillator in situ  Dyslipidemia  History of pulmonary embolism  Mild cognitive impairment with memory loss  Sick sinus syndrome ([place])  Prostate cancer ([place])  AKI (acute kidney injury) ([place])   I have reviewed the medical  record, interviewed the patient and family, and examined the patient. The following aspects are pertinent.  Past Medical History:  Diagnosis Date   AKI (acute kidney injury) ([place]) 05/10/2022   Anticoagulant long-term use    eliquis--- managed by cardiology   BPH with urinary obstruction    urologist-- dr gay   CHB (complete heart block) ([place])    CKD (chronic kidney disease), stage III ([place])    Dual implantable cardioverter-defibrillator in situ 03/2011   followed by cardiologist-- dr Rowland Lathe--   pt had DDD pacemaker and changed out for [facility])  Dual ICD 05/ 2012 for symptomatic polymorphic VT;  generator change 12/ 2016   Dyslipidemia    Early onset Alzheimer's dementia without behavioral disturbance [facility])    neurologist-- dr d. Bjorn Loser   Elevated troponin 05/11/2022   Foley catheter in place    History of gout    remote   History of pulmonary embolism 2011   per pt wife no previous blood clots prior to and none since 2011   Moderate tricuspid regurgitation    per last echo in care everywhere 05-18-2020  moderate to severe TR , RSVP 37-36mHg   Persistent atrial fibrillation ([facility]    approx late 2011 to 01/ 2012 dx AFib  s/p DCCV 01/ 2012 unsuccessful   Sepsis ([facility] 05/10/2022   Septic shock ([place] 05/11/2022   Sigmoid diverticulosis    SSS (sick sinus syndrome) ([facility] 01/2011   03/ 2012 s/p  permanent pacemaker  then upgraded to dual ICD 05/ 2012 due to symptomatic VT   Urine retention 06/2021   has foley catheter   Ventricular tachycardia, 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[facility] 03/2011   pt had recurrent syncopal episode, 03-29-2011 pacemaker intergatted in office baseline Afib and runs of NSVT which correlated with syncopal episodes  s/p removal pacemake and placement dual ICD   Social History   Socioeconomic History   Marital status: Married    Spouse name: Not on file   Number of children: Not on file   Years of education: Not on file   Highest education level: Not on file  Occupational History    Not on file  Tobacco Use   Smoking status: Never   Smokeless tobacco: Never  Vaping Use   Vaping Use: Never used  Substance and Sexual Activity   Alcohol use: Not Currently   Drug use: Never   Sexual activity: Not on file  Other Topics Concern   Not on file  Social History Narrative   Not on file   Social Determinants of Health   Financial Resource Strain: Not on file  Food Insecurity: Not on file  Transportation Needs: Not on file  Physical Activity: Not on file  Stress: Not on file  Social Connections: Not on file   Family History  Problem Relation Age of Onset   Diabetes Mellitus II Other    Scheduled Meds:  apixaban  5 mg Oral BID   atorvastatin  10 mg Oral QHS   Chlorhexidine Gluconate Cloth  6 each Topical Daily   feeding supplement  237 mL Oral BID BM   fenofibrate  160 mg Oral Daily   memantine  10 mg Oral BID   midodrine  10 mg Oral TID WC   mirtazapine  30 mg Oral QHS   multivitamin with minerals  1 tablet Oral Daily   mouth rinse  15 mL Mouth Rinse 4 times per day   sodium chloride flush  3 mL Intravenous Q12H   Trospium Chloride  60 mg Oral Daily   Continuous Infusions:   ceFAZolin (ANCEF) IV     lactated ringers 125 mL/hr at 06/17/22 0900   PRN Meds:.acetaminophen **OR** acetaminophen, mouth rinse, polyethylene glycol Medications Prior to Admission:  Prior to Admission medications   Medication Sig Start Date End Date Taking? Authorizing Provider  acetaminophen (TYLENOL) 500 MG tablet Take 500 mg by mouth every 6 (six) hours as needed for mild pain, headache or fever. 09/22/16  Yes [provider]  atorvastatin (LIPITOR) 10 MG tablet Take 10 mg by mouth at bedtime. 02/15/15  Yes [provider]  ELIQUIS 5 MG TABS tablet Take 1 tablet (5 mg total) by mouth 2 (two) times daily. 09/19/21  Yes Festus Aloe, MD  fenofibrate (TRICOR) 145 MG tablet Take 145 mg by mouth daily. 02/15/15  Yes [provider]  memantine (NAMENDA)  10 MG tablet Take 10 mg by mouth in the morning and at bedtime. 06/02/21  Yes [provider]  mirtazapine (REMERON) 30 MG tablet Take 30 mg by mouth at bedtime. 04/24/22  Yes [provider]  Multiple Vitamin (MULTIVITAMIN) tablet Take 1 tablet by mouth daily.   Yes [provider]  Omega-3 Fatty Acids (OMEGA-3 PO) Take 1 capsule by mouth daily.   Yes [provider]  tamsulosin (FLOMAX) 0.4 MG CAPS capsule Take 0.4 mg by mouth daily. 05/06/21  Yes [provider]  Trospium Chloride 60 MG CP24 Take 60 mg by mouth in the morning.   Yes [provider]   Allergies  Allergen Reactions   Nsaids Other (See Comments)    Cannot take due to defibrillator/pacemaker   Saw [place] Diarrhea   Vytorin [Ezetimibe-Simvastatin] Other (See Comments)    Myalgias    Adhesive [Tape] Rash   Review of Systems Pain in left leg/foot.   Physical Exam Awake alert oriented Sitting up in bed Regular work of breathing Monitor noted Abdomen is not distended Extremities with no edema Has heel protectors on  Vital Signs: BP (!) 104/42   Pulse 81   Temp 97.9 F (36.6 C) (Oral)   Resp (!) 29   Ht '6\' 2"'$  (1.88 m)   Wt 74.9 kg   SpO2 99%   BMI 21.20 kg/m  Pain Scale: PAINAD   Pain Score: 0-No pain   SpO2: SpO2: 99 % O2 Device:SpO2: 99 % O2 Flow Rate: .O2 Flow Rate (L/min): 15 L/min (supplemental, provided due to BP and increased frequency of apnea intervals)  IO: Intake/output summary:  Intake/Output Summary (Last 24 hours) at 06/17/2022 1027 Last data filed at 06/17/2022 0900 Gross per 24 hour  Intake 3968.68 ml  Output 2375 ml  Net 1593.68 ml    LBM: Last BM Date : 06/16/22 Baseline Weight: Weight: 70.8 kg Most recent weight: Weight: 74.9 kg     Palliative Assessment/Data:   PPS 40%  Time In:  9.30 Time Out:  10.30 Time Total:  60  Greater  than 50%  of this time was spent counseling and coordinating care related to the above assessment  and plan.  Signed by: Loistine Chance, MD   Please contact Palliative Medicine Team phone at [phone] for questions and concerns.  For individual provider: See Shea Evans

## 2022-06-17 NOTE — Progress Notes (Signed)
PROGRESS NOTE Eric Krueger  IRS:[id number] DOB: [birth year] DOA: 06/15/2022 PCP: Joneen Boers, MD   Brief Narrative/Hospital Course: 76 y.o.m w/ history significant of BPH with LUTS, obstructive uropathy, prostate cancer, anemia, atrial fibrillation, complete heart block, sick sinus syndrome, status post AICD, chronic pain, hyperlipidemia, gout, pulmonary embolus, dementia/mild cognitive impairment, diverticulosis presented with weakness, blood in urine, and confusion episode in morning where he thought he was in [place], had malodorous diarrhea, decreased urinary output of catheter today with some sludge.  They called her urologist who recommended ED evaluation.Recently admitted for sepsis UTI pressures, bilateral stent by urology in June.  In KK:XFGHW BP in the 29H to 371 systolic with intermittent low maps around 65, creatinine 2.14 from baseline 1.3,  hb 10.8. PT and INR stably elevated at 20.4 and 2.0 respectively.  Lactic acid normal x2.Urine culture and blood culture sent , CXR-no acute normality.CT renal stone study with bilateral nephroureteral stent in place with increased right hydronephrosis also noted was left inguinal hernia with nonobstructed small bowel contained within.Stable osseous metastatic disease also noted.Urology consulted, admitted for further management.  Foley catheterization 7/27. 7/27 night-7/28 am-significant events with persistent hypotension needing multiple IV fluid boluses, hypoxia-episodes-placed on nonrebreather, multiple attempts to reach spouse unable, son was contacted and he was also trying to reach patient's spouse as well    Subjective: Seen and examined this morning and later on after arrival of wife In the morning was sleeping-but woke up able to tell me his name and that he is in the hospital Significant events overnight with hypotension needing boluses multiple times placed on 15l NRB overnight-with possible sleep apnea Creat 1.7> 1.5 This morning on room  air Assessment and Plan: Principal Problem:   UTI (urinary tract infection) Active Problems:   Obstructive uropathy   Anemia   Atrial fibrillation, chronic (HCC)   Benign non-nodular prostatic hyperplasia with lower urinary tract symptoms   Chronic pain disorder   Complete heart block (HCC)   Dual implantable cardioverter-defibrillator in situ   Dyslipidemia   History of gout   History of pulmonary embolism   Mild cognitive impairment with memory loss   Sick sinus syndrome ([place])   Prostate cancer ([place])   AKI (acute kidney injury) ([place])   Protein-calorie malnutrition, severe  E. coli UTI : Antibiotic changed to Ancef.  Likely transition to oral on discharge.  Previously had septic shock with suprapubic catheter, obstructive uropathy and malfunctioning catheter-had cystoscopy and stent placement in June urine culture that time grew Proteus sensitive to cefazolin.Dr. Abner Greenspan from urology following, blood culture negative.  Continue ceftriaxone, Foley catheter changed 7/27.    Hypotension episodes: Patient has had hypotension episodes since admission started on midodrine.  Last admission needed pressors.Wife had mentioned that there approaching palliative route but if he needs pressor they would like to discuss first and if it happens during night's she advised to call patient's son-significant events stable overnight.  Again extensive discuss goals of care, does not want pressor, will continue IV fluid hydration and midodrine as above  Diarrhea: PCR negative, antigen +, GIP pending.cont ivf.  Flexi-Seal placed on admission- flexiseal off and only had 2 BM and improved Hypernatremia-mild-resolved  AKI Obstructive uropathy: Creatinine slowly improving on IV fluids monitor urine output, BMP Intake/Output Summary (Last 24 hours) at 06/17/2022 1157 Last data filed at 06/17/2022 1000 Gross per 24 hour  Intake 4093.7 ml  Output 2375 ml  Net 1718.7 ml    Recent Labs  Lab 06/15/22 1516  06/16/22 0316  06/17/22 0112  BUN 45* 41* 33*  CREATININE 2.14* 1.73* 1.55*    Normocytic anemia, likely anemia of chronic disease, hemoglobin level fluctuates 8 to 11 g.  Likely hemodilution given significant volume resuscitation.Monitor Recent Labs  Lab 06/15/22 1516 06/16/22 0316 06/17/22 0112  HGB 10.8* 8.8* 8.1*  HCT 35.0* 28.7* 25.9*    BPH with LUTS Prostate cancer history with bony mets stable Chronic pain disorder: Continue pain control, continue antibiotics as #1.  Continue long-term indwelling Foley catheter-changed 7/27  Atrial fibrillation, chronic : on Eliquis SSS/Complete heart block s/p ICD-paced ekg. Dyslipidemia-on Lipitor and fibrate History of pulmonary embolism-cont Eliquis  Mild cognitive impairment with memory loss: Alert awake oriented to self which is baseline per wife . Continue Namenda, Remeron previously needed Seroquel for delirium during prior admission.  Deconditioning/debility/failure to thrive GOC: Palliative care has seen the patient again, wife plans to take him home tomorrow and does not have further hospitalization in the future, continue DNR.she was wondering if defibrillator can be turned off but after discussing with her son they would like to  hold off for now.  DVT prophylaxis: ELIQUIS Code Status:   Code Status: DNR Family Communication: plan of care discussed with patient/WIFE at bedside. Patient status is: Inpatient because of low blood pressure, management of UTI pending culture Level of care: Stepdown   Dispo: The patient is from: HOME            Anticipated disposition: HOME TOMORROW PER FAMILY REQUEST if MEDICALLY STABLE   Mobility Assessment (last 72 hours)     Mobility Assessment   No documentation.            Objective: Vitals last 24 hrs: Vitals:   06/17/22 0830 06/17/22 0900 06/17/22 0930 06/17/22 1000  BP: (!) 108/45 (!) 82/45 (!) 104/42 (!) 92/39  Pulse: 80 80 81 80  Resp: (!) 27 17 (!) 29 (!) 29  Temp:       TempSrc:      SpO2: 99% 98% 99% 98%  Weight:      Height:       Weight change: 4.1 kg  Physical Examination: General exam: AAOX1-2, older than stated age, weak appearing. HEENT:Oral mucosa moist, Ear/Nose WNL grossly, dentition normal. Respiratory system: bilaterally diminished, no use of accessory muscle Cardiovascular system: S1 & S2 +, No JVD,. Gastrointestinal system: Abdomen soft,NT,ND,BS+ Nervous System:Alert, awake, moving extremities and grossly nonfocal Extremities: LE ankle edema NEG, distal peripheral pulses palpable.  Skin: No rashes,no icterus. MSK: Normal muscle bulk,tone, power  FOLEY +  Medications reviewed:  Scheduled Meds:  apixaban  5 mg Oral BID   atorvastatin  10 mg Oral QHS   Chlorhexidine Gluconate Cloth  6 each Topical Daily   feeding supplement  237 mL Oral BID BM   fenofibrate  160 mg Oral Daily   memantine  10 mg Oral BID   midodrine  10 mg Oral TID WC   mirtazapine  30 mg Oral QHS   multivitamin with minerals  1 tablet Oral Daily   mouth rinse  15 mL Mouth Rinse 4 times per day   sodium chloride flush  3 mL Intravenous Q12H   Trospium Chloride  60 mg Oral Daily   Continuous Infusions:   ceFAZolin (ANCEF) IV     lactated ringers 125 mL/hr at 06/17/22 1039      Diet Order             Diet regular Room service appropriate? Yes; Fluid consistency: Thin  Diet effective now                   Intake/Output Summary (Last 24 hours) at 06/17/2022 1157 Last data filed at 06/17/2022 1000 Gross per 24 hour  Intake 4093.7 ml  Output 2375 ml  Net 1718.7 ml   Net IO Since Admission: 1,809.99 mL [06/17/22 1157]  Wt Readings from Last 3 Encounters:  06/17/22 74.9 kg  05/16/22 78.1 kg  04/16/22 76.6 kg     Unresulted Labs (From admission, onward)    None     Data Reviewed: I have personally reviewed following labs and imaging studies CBC: Recent Labs  Lab 06/15/22 1516 06/16/22 0316 06/17/22 0112  WBC 7.0 4.6 3.8*  NEUTROABS  5.7  --   --   HGB 10.8* 8.8* 8.1*  HCT 35.0* 28.7* 25.9*  MCV 90.0 90.8 90.9  PLT 330 251 250   Basic Metabolic Panel: Recent Labs  Lab 06/15/22 1516 06/16/22 0316 06/17/22 0112  NA 146* 143 145  K 4.5 3.7 3.5  CL 113* 110 119*  CO2 '26 25 22  '$ GLUCOSE 133* 99 137*  BUN 45* 41* 33*  CREATININE 2.14* 1.73* 1.55*  CALCIUM 10.1 9.0 9.1   GFR: Estimated Creatinine Clearance: 41.6 mL/min (A) (by C-G formula based on SCr of 1.55 mg/dL (H)). Liver Function Tests: Recent Labs  Lab 06/15/22 1516 06/16/22 0316 06/17/22 0112  AST '30 25 26  '$ ALT '14 10 11  '$ ALKPHOS 77 67 60  BILITOT 0.8 0.4 0.2*  PROT 7.0 5.4* 4.7*  ALBUMIN 3.0* 2.4* 2.0*   No results for input(s): "LIPASE", "AMYLASE" in the last 168 hours. No results for input(s): "AMMONIA" in the last 168 hours. Coagulation Profile: Recent Labs  Lab 06/15/22 1516  INR 2.0*   BNP (last 3 results) No results for input(s): "PROBNP" in the last 8760 hours. HbA1C: No results for input(s): "HGBA1C" in the last 72 hours. CBG: No results for input(s): "GLUCAP" in the last 168 hours. Lipid Profile: No results for input(s): "CHOL", "HDL", "LDLCALC", "TRIG", "CHOLHDL", "LDLDIRECT" in the last 72 hours. Thyroid Function Tests: No results for input(s): "TSH", "T4TOTAL", "FREET4", "T3FREE", "THYROIDAB" in the last 72 hours. Sepsis Labs: Recent Labs  Lab 06/15/22 1516 06/15/22 1642  LATICACIDVEN 1.8 1.2    Recent Results (from the past 240 hour(s))  Blood culture (routine x 2)     Status: None (Preliminary result)   Collection Time: 06/15/22  3:00 PM   Specimen: BLOOD  Result Value Ref Range Status   Specimen Description   Final    BLOOD RIGHT ANTECUBITAL Performed at [facility] [address] [place], [place] [address]    Special Requests   Final    BOTTLES DRAWN AEROBIC AND ANAEROBIC Blood Culture adequate volume Performed at [facility] [address], [place], [place]  [address]    Culture   Final    NO GROWTH 2 DAYS Performed at [facility] [address] [place], [place] [address]    Report Status PENDING  Incomplete  Blood culture (routine x 2)     Status: None (Preliminary result)   Collection Time: 06/15/22  3:45 PM   Specimen: BLOOD RIGHT ARM  Result Value Ref Range Status   Specimen Description   Final    BLOOD RIGHT ARM Performed at [facility], [place] [address] [place], [place] [address]    Special Requests   Final    BOTTLES  DRAWN AEROBIC AND ANAEROBIC Blood Culture adequate volume Performed at [facility] [address] [place], [place] [address]    Culture   Final    NO GROWTH 2 DAYS Performed at [facility] [address], [place], [place] [address]    Report Status PENDING  Incomplete  Urine Culture     Status: Abnormal (Preliminary result)   Collection Time: 06/15/22  4:35 PM   Specimen: Urine, Catheterized  Result Value Ref Range Status   Specimen Description   Final    URINE, CATHETERIZED Performed at [facility] [address] [place], [place] [address]    Special Requests   Final    NONE Performed at [facility], [place] [address], [place], [place] [address]    Culture (A)  Final    >=100,000 COLONIES/mL ESCHERICHIA COLI >=100,000 COLONIES/mL PROTEUS MIRABILIS SUSCEPTIBILITIES TO FOLLOW Performed at [facility] [address] [place], [place] [address]    Report Status PENDING  Incomplete   Organism ID, Bacteria ESCHERICHIA COLI (A)  Final      Susceptibility   Escherichia coli - MIC*    AMPICILLIN <=2 SENSITIVE Sensitive     CEFAZOLIN <=4 SENSITIVE Sensitive     CEFEPIME <=0.12 SENSITIVE Sensitive     CEFTRIAXONE <=0.25 SENSITIVE Sensitive     CIPROFLOXACIN <=0.25 SENSITIVE Sensitive     GENTAMICIN <=1 SENSITIVE Sensitive     IMIPENEM <=0.25 SENSITIVE Sensitive     NITROFURANTOIN 32 SENSITIVE Sensitive     TRIMETH/SULFA  <=20 SENSITIVE Sensitive     AMPICILLIN/SULBACTAM <=2 SENSITIVE Sensitive     PIP/TAZO <=4 SENSITIVE Sensitive     * >=100,000 COLONIES/mL ESCHERICHIA COLI  MRSA Next Gen by PCR, Nasal     Status: None   Collection Time: 06/15/22 11:24 PM   Specimen: Nasal Mucosa; Nasal Swab  Result Value Ref Range Status   MRSA by PCR Next Gen NOT DETECTED NOT DETECTED Final    Comment: (NOTE) The GeneXpert MRSA Assay (FDA approved for NASAL specimens only), is one component of a comprehensive MRSA colonization surveillance program. It is not intended to diagnose MRSA infection nor to guide or monitor treatment for MRSA infections. Test performance is not FDA approved in patients less than 71 years old. Performed at [facility], [place] [address], [place], [place] [address]   C Difficile Quick Screen w PCR reflex     Status: Abnormal   Collection Time: 06/16/22 12:42 PM   Specimen: Stool  Result Value Ref Range Status   C Diff antigen POSITIVE (A) NEGATIVE Final   C Diff toxin NEGATIVE NEGATIVE Final   C Diff interpretation Results are indeterminate. See PCR results.  Final    Comment: Performed at [facility], [place] [address], [place], [place] [address]  Gastrointestinal Panel by PCR , Stool     Status: None   Collection Time: 06/16/22 12:42 PM   Specimen: Stool  Result Value Ref Range Status   Campylobacter species NOT DETECTED NOT DETECTED Final   Plesimonas shigelloides NOT DETECTED NOT DETECTED Final   Salmonella species NOT DETECTED NOT DETECTED Final   Yersinia enterocolitica NOT DETECTED NOT DETECTED Final   Vibrio species NOT DETECTED NOT DETECTED Final   Vibrio cholerae NOT DETECTED NOT DETECTED Final   Enteroaggregative E coli (EAEC) NOT DETECTED NOT DETECTED Final   Enteropathogenic E coli (EPEC) NOT DETECTED NOT DETECTED Final   Enterotoxigenic E coli (  ETEC) NOT DETECTED NOT DETECTED Final   Shiga like toxin producing E coli (STEC) NOT DETECTED  NOT DETECTED Final   Shigella/Enteroinvasive E coli (EIEC) NOT DETECTED NOT DETECTED Final   Cryptosporidium NOT DETECTED NOT DETECTED Final   Cyclospora cayetanensis NOT DETECTED NOT DETECTED Final   Entamoeba histolytica NOT DETECTED NOT DETECTED Final   Giardia lamblia NOT DETECTED NOT DETECTED Final   Adenovirus F40/41 NOT DETECTED NOT DETECTED Final   Astrovirus NOT DETECTED NOT DETECTED Final   Norovirus GI/GII NOT DETECTED NOT DETECTED Final   Rotavirus A NOT DETECTED NOT DETECTED Final   Sapovirus (I, II, IV, and V) NOT DETECTED NOT DETECTED Final    Comment: Performed at [facility], [address], [place], [place] [address]  C. Diff by PCR, Reflexed     Status: None   Collection Time: 06/16/22 12:42 PM  Result Value Ref Range Status   Toxigenic C. Difficile by PCR NEGATIVE NEGATIVE Final    Comment: Patient is colonized with non toxigenic C. difficile. May not need treatment unless significant symptoms are present. Performed at [facility], [place] [address] [place], [place] [address]     Antimicrobials: Anti-infectives (From admission, onward)    Start     Dose/Rate Route Frequency Ordered Stop   06/17/22 2200  ceFAZolin (ANCEF) IVPB 2g/100 mL premix        2 g 200 mL/hr over 30 Minutes Intravenous Every 12 hours 06/17/22 1002     06/15/22 2200  cefTRIAXone (ROCEPHIN) 2 g in sodium chloride 0.9 % 100 mL IVPB  Status:  Discontinued        2 g 200 mL/hr over 30 Minutes Intravenous Every 24 hours 06/15/22 2132 06/17/22 1002      Culture/Microbiology    Component Value Date/Time   SDES  06/15/2022 1635    URINE, CATHETERIZED Performed at [facility], [place] [address], [place], [place] [address]    SPECREQUEST  06/15/2022 1635    NONE Performed at [facility], [place] [address], [place], [place] [address]    CULT (A) 06/15/2022 1635    >=100,000 COLONIES/mL ESCHERICHIA COLI >=100,000 COLONIES/mL PROTEUS  MIRABILIS SUSCEPTIBILITIES TO FOLLOW Performed at [facility] [address] [place], [place] [address]    REPTSTATUS PENDING 06/15/2022 1635    Other culture-see note  Radiology Studies: CT Renal Stone Study  Result Date: 06/15/2022 CLINICAL DATA:  Foreign body, GU tract Patient stents in place. History of obstructive uropathy EXAM: CT ABDOMEN AND PELVIS WITHOUT CONTRAST TECHNIQUE: Multidetector CT imaging of the abdomen and pelvis was performed following the standard protocol without IV contrast. RADIATION DOSE REDUCTION: This exam was performed according to the departmental dose-optimization program which includes automated exposure control, adjustment of the mA and/or kV according to patient size and/or use of iterative reconstruction technique. COMPARISON:  CT 05/14/2022. FINDINGS: Lower chest: Bibasilar scarring/linear atelectasis. Mild cardiomegaly with pacemaker/AICD leads in the right atrium and right ventricle. Hepatobiliary: No focal liver abnormality is seen. The gallbladder is unremarkable. Pancreas: Unremarkable. No pancreatic ductal dilatation or surrounding inflammatory changes. Spleen: Normal in size without focal abnormality. Adrenals/Urinary Tract: Adrenal glands are unremarkable. Atrophic left kidney. There are bilateral nephroureteral stents in place there is increased prominence of right renal collecting system and proximal ureter with decompression distally. No left-sided hydroureteronephrosis. The bladder is decompressed with Foley catheter in place. Stomach/Bowel: Mild distention of the stomach filled with fluid. There is no evidence of bowel obstruction.The  appendix is normal. Moderate stool burden in the colon. Vascular/Lymphatic: Aortoiliac atherosclerosis. No AAA. No lymphadenopathy. Reproductive: Unremarkable. Other: There is a left inguinal hernia containing nonobstructed sigmoid colon. Fat containing right inguinal hernia. There is minimal stranding on the left.  Musculoskeletal: Similar distribution of osseous sclerotic metastatic disease, particularly in the pelvis. No acute osseous abnormality. IMPRESSION: Bilateral nephroureteral stents are in place. Increased right sided hydronephrosis. Resolved left hydronephrosis. Left inguinal hernia containing non-obstructed sigmoid colon, with mild adjacent stranding, correlate with tenderness and reducibility. Unchanged sclerotic osseous metastatic disease. Electronically Signed   By: Maurine Simmering M.D.   On: 06/15/2022 17:20   DG Chest Portable 1 View  Result Date: 06/15/2022 CLINICAL DATA:  Questionable sepsis - evaluate for abnormality EXAM: PORTABLE CHEST 1 VIEW COMPARISON:  None Available. FINDINGS: Left chest wall dual lead pacemaker and defibrillator in grossly similar position. The heart and mediastinal contours are unchanged. Aortic calcification. No focal consolidation. No pulmonary edema. No pleural effusion. No pneumothorax. No acute osseous abnormality. IMPRESSION: 1. No active disease. 2.  Aortic Atherosclerosis (ICD10-I70.0). Electronically Signed   By: Iven Finn M.D.   On: 06/15/2022 16:00     LOS: 2 days   Antonieta Pert, MD [facility]  06/17/2022, 11:57 AM

## 2022-06-17 NOTE — TOC Initial Note (Signed)
Transition of Care [facility]) - Initial/Assessment Note   Patient Details  Name: Eric Krueger MRN: [id number] Date of Birth: 26-Mar-1946  Transition of [facility]) CM/SW Contact:    Sherie Karol, LCSW Phone Number: 06/17/2022, 2:49 PM  Clinical Narrative: [facility] consulted for outpatient palliative referral. CSW spoke with wife, Donnavan Covault, regarding palliative services. Wife requested that Wall call Manus Gunning with [facility] [phone]). CSW made referral to [place] with [facility], which was accepted, and the wife will be contacted regarding setting outpatient palliative services.  CSW confirmed with Caryl Pina with [facility] that patient is active with the agency for [facility]. HH orders have been placed for PT/OT/RN/aide.  Expected Discharge Plan: [place] Barriers to Discharge: Continued Medical Work up  Patient Goals and CMS Choice Patient states their goals for this hospitalization and ongoing recovery are:: Return home with [facility] and OP palliative services CMS Medicare.gov Compare Post Acute Care list provided to:: Patient Represenative (must comment) Choice offered to / list presented to : Spouse  Expected Discharge Plan and Services Expected Discharge Plan: [place] In-house Referral: Clinical Social Work Post Acute Care Choice: [place] arrangements for the past 2 months: [place]             DME Arranged: N/A DME Agency: NA HH Arranged: RN, PT, OT, Nurse's Aide [place] Agency: [place] ([facility]) Date [place]: 06/17/22 Representative spoke with at [place]: Caryl Pina (patient already active with [facility])  Prior Living Arrangements/Services Living arrangements for the past 2 months: [place] Lives with:: Spouse Patient language and need for interpreter reviewed:: Yes Do you feel safe going back to the place where you live?: Yes      Need for Family  Participation in Patient Care: Yes (Comment) Care giver support system in place?: Yes (comment) Current home services: Home OT, Home PT, Home RN (Active with HH through [facility].) Criminal Activity/Legal Involvement Pertinent to Current Situation/Hospitalization: No - Comment as needed  Activities of Daily Living Home Assistive Devices/Equipment: Environmental consultant (specify type), Cane (specify quad or straight), Bedside commode/3-in-1, Shower chair with back ADL Screening (condition at time of admission) Patient's cognitive ability adequate to safely complete daily activities?: No Is the patient deaf or have difficulty hearing?: No Does the patient have difficulty seeing, even when wearing glasses/contacts?: No Does the patient have difficulty concentrating, remembering, or making decisions?: Yes Patient able to express need for assistance with ADLs?: Yes Does the patient have difficulty dressing or bathing?: Yes Independently performs ADLs?: No Communication: Independent Dressing (OT): Needs assistance Is this a change from baseline?: Pre-admission baseline Grooming: Needs assistance Is this a change from baseline?: Pre-admission baseline Feeding: Needs assistance Is this a change from baseline?: Pre-admission baseline Bathing: Needs assistance Is this a change from baseline?: Pre-admission baseline Toileting: Needs assistance Is this a change from baseline?: Pre-admission baseline In/Out Bed: Needs assistance Is this a change from baseline?: Pre-admission baseline Walks in Home: Needs assistance Is this a change from baseline?: Pre-admission baseline Does the patient have difficulty walking or climbing stairs?: Yes Weakness of Legs: Both Weakness of Arms/Hands: Both  Permission Sought/Granted Permission sought to share information with : Other (comment) Permission granted to share information with : Yes, Verbal Permission Granted Permission granted to share info w AGENCY:  [facility] [place]  Emotional Assessment Attitude/Demeanor/Rapport: Unable to Assess Affect (typically observed): Unable to Assess Orientation: : Oriented to Self, Oriented to  Place Alcohol / Substance Use: Not Applicable Psych Involvement: No (comment)  Admission diagnosis:  Generalized weakness [R53.1] AKI (acute kidney injury) ([place]) [N17.9] Patient Active Problem List   Diagnosis Date Noted   Protein-calorie malnutrition, severe 06/16/2022   AKI (acute kidney injury) ([place]) 06/15/2022   UTI (urinary tract infection) 05/16/2022   Prostate cancer ([place]) 05/16/2022   Obstructive uropathy 05/16/2022   Electrolyte and fluid disorder 05/16/2022   Anemia 05/16/2022   Moderate protein-calorie malnutrition ([place]) 05/11/2022   Constipation 05/10/2022   ARF (acute renal failure) ([place]) 07/04/2021   Hypothermia 07/04/2021   DDD (degenerative disc disease), lumbar 01/01/2019   Sacroiliitis ([place]) 06/27/2018   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   History of gout 04/10/2018   Bilateral lower extremity edema 04/09/2018   Chronic pain disorder 12/22/2017   Need for hepatitis C screening test 06/29/2016   Benign non-nodular prostatic hyperplasia with lower urinary tract symptoms 06/15/2016   Abnormal glucose 11/27/2015   Dupuytren's contracture 09/12/2014   Atrial fibrillation, chronic ([place]) 06/03/2014   Mild cognitive impairment with memory loss 05/27/2014   Sigmoid diverticulosis 02/25/2014   History of pulmonary embolism 05/01/2013   Complete heart block ([place]) 05/17/2011   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   Mitral regurgitation 04/13/2011   Sick sinus syndrome ([place]) 04/13/2011   Ventricular tachycardia ([place]) 04/13/2011   Dyslipidemia 08/10/2010   PCP:  Joneen Boers, MD Pharmacy:   [facility] [place], [place] [address] [place] [address] [place] [address] Phone: [phone]  Fax: [phone]  [place] [address] Phone: [phone] Fax: [phone]  Readmission Risk Interventions    06/17/2022    2:43 PM 05/17/2022    2:44 PM  Readmission Risk Prevention Plan  Transportation Screening Complete Complete  PCP or Specialist Appt within 3-5 Days  Complete  HRI or [place] Complete Complete  Social Work Consult for [place] Planning/Counseling Complete Complete  Palliative Care Screening Complete Not Applicable  Medication Review Press photographer)  Complete

## 2022-06-18 DIAGNOSIS — T83511A Infection and inflammatory reaction due to indwelling urethral catheter, initial encounter: Secondary | ICD-10-CM | POA: Diagnosis not present

## 2022-06-18 DIAGNOSIS — N390 Urinary tract infection, site not specified: Secondary | ICD-10-CM | POA: Diagnosis not present

## 2022-06-18 LAB — BASIC METABOLIC PANEL
Anion gap: 7 (ref 5–15)
BUN: 27 mg/dL — ABNORMAL HIGH (ref 8–23)
CO2: 24 mmol/L (ref 22–32)
Calcium: 8.9 mg/dL (ref 8.9–10.3)
Chloride: 113 mmol/L — ABNORMAL HIGH (ref 98–111)
Creatinine, Ser: 1.42 mg/dL — ABNORMAL HIGH (ref 0.61–1.24)
GFR, Estimated: 51 mL/min — ABNORMAL LOW (ref >60)
Glucose, Bld: 83 mg/dL (ref 70–99)
Potassium: 3.4 mmol/L — ABNORMAL LOW (ref 3.5–5.1)
Sodium: 144 mmol/L (ref 135–145)

## 2022-06-18 LAB — CBC
HCT: 28.3 % — ABNORMAL LOW (ref 39.0–52.0)
Hemoglobin: 8.7 g/dL — ABNORMAL LOW (ref 13.0–17.0)
MCH: 27.8 pg (ref 26.0–34.0)
MCHC: 30.7 g/dL (ref 30.0–36.0)
MCV: 90.4 fL (ref 80.0–100.0)
Platelets: 286 10*3/uL (ref 150–400)
RBC: 3.13 MIL/uL — ABNORMAL LOW (ref 4.22–5.81)
RDW: 15.6 % — ABNORMAL HIGH (ref 11.5–15.5)
WBC: 5.6 10*3/uL (ref 4.0–10.5)
nRBC: 0 % (ref 0.0–0.2)

## 2022-06-18 LAB — URINE CULTURE: Culture: >=100000 — AB

## 2022-06-18 MED ORDER — MIDODRINE HCL 10 MG PO TABS
10.0000 mg | ORAL_TABLET | Freq: Three times a day (TID) | ORAL | 0 refills | Status: AC
Start: 2022-06-18 — End: 2022-07-18

## 2022-06-18 MED ORDER — POTASSIUM CHLORIDE CRYS ER 20 MEQ PO TBCR
40.0000 meq | EXTENDED_RELEASE_TABLET | Freq: Once | ORAL | Status: AC
Start: 2022-06-18 — End: 2022-06-18
  Administered 2022-06-18: 40 meq via ORAL
  Filled 2022-06-18: qty 2

## 2022-06-18 MED ORDER — CEFDINIR 300 MG PO CAPS: 300.0000 mg | ORAL_CAPSULE | Freq: Two times a day (BID) | ORAL | 0 refills | Status: AC

## 2022-06-18 NOTE — Discharge Summary (Signed)
Physician Discharge Summary  Eric Krueger:[id number] DOB: 16-Jul-1946 DOA: 06/15/2022  PCP: Joneen Boers, MD  Admit date: 06/15/2022 Discharge date: 06/18/2022 Recommendations for Outpatient Follow-up:  Follow up with PCP in 1 weeks-call for appointment Please follow-up with palliative care as outpatient  Discharge Dispo: home w/ [facility] Discharge Condition: Stable Code Status:   Code Status: DNR Diet recommendation:  Diet Order             Diet regular Room service appropriate? No; Fluid consistency: Thin  Diet effective now                    Brief/Interim Summary: 76 y.o.m w/ history significant of BPH with LUTS, obstructive uropathy, prostate cancer, anemia, atrial fibrillation, complete heart block, sick sinus syndrome, status post AICD, chronic pain, hyperlipidemia, gout, pulmonary embolus, dementia/mild cognitive impairment, diverticulosis presented with weakness, blood in urine, and confusion episode in morning where he thought he was in [place], had malodorous diarrhea, decreased urinary output of catheter today with some sludge.  They called her urologist who recommended ED evaluation.Recently admitted for sepsis UTI pressures, bilateral stent by urology in June.  In KP:TWSFK BP in the 81E to 751 systolic with intermittent low maps around 65, creatinine 2.14 from baseline 1.3,  hb 10.8. PT and INR stably elevated at 20.4 and 2.0 respectively.  Lactic acid normal x2.Urine culture and blood culture sent , CXR-no acute normality.CT renal stone study with bilateral nephroureteral stent in place with increased right hydronephrosis also noted was left inguinal hernia with nonobstructed small bowel contained within.Stable osseous metastatic disease also noted.Urology consulted, admitted for further management.  Foley cath was changed-is tentatively scheduled for bilateral ureteroscopy with laser lithotripsy scheduled for 07/01/2022. 7/27 night-7/28 am-significant events with persistent  hypotension needing multiple IV fluid boluses Palliative care had further seen the patient, wife plans to return him home on 7/29 as he does not do well in the hospital due to his mild dementia.  She is considering a palliative route option but wants to treat what is treatable at this time. Spouse inquired about turning off defibrillator but holding off after discussing with son. Urine culture grew E. coli that and Proteus-sensitive to cefdinir.  At this time no more hypotension episodes blood pressure doing very well patient is alert awake oriented to baseline, wife at the bedside requesting for discharge this morning.  We will be discharging him home with home health and outpatient follow[facility] with [facility]      Discharge Diagnoses:  Principal Problem:   UTI (urinary tract infection) Active Problems:   Obstructive uropathy   Anemia   Atrial fibrillation, chronic (HCC)   Benign non-nodular prostatic hyperplasia with lower urinary tract symptoms   Chronic pain disorder   Complete heart block (HCC)   Dual implantable cardioverter-defibrillator in situ   Dyslipidemia   History of gout   History of pulmonary embolism   Mild cognitive impairment with memory loss   Sick sinus syndrome (HCC)   Prostate cancer (HCC)   AKI (acute kidney injury) ([place])   Protein-calorie malnutrition, severe  E. coli and Proteus UTI : Medically stable at this time, changing to oral antibiotic for discharge to home with home health.Previously had septic shock with suprapubic catheter, obstructive uropathy and malfunctioning catheter-had cystoscopy and stent placement in June urine culture that time grew Proteus sensitive to cefazolin> dc on cefdinir-discussed with pharmacy. Dr. Abner Greenspan from urology following, blood culture negative. Foley catheter changed 06/16/22,is tentatively scheduled for bilateral  ureteroscopy with laser lithotripsy scheduled for 07/01/2022.     Hypotension episodes: No recurrent hypotension  episodes at this time.  Blood pressure stable.  Continue midodrine, encourage oral intake.  Discussed about continue Flomax only at bedtime, may not need if he is going to continue on catheter -she we will discuss with urology   Diarrhea: PCR negative, antigen +, GIP neg.Flexi-Seal placed on admission- flexiseal off, no diarrhea. Hypernatremia-mild-resolved   AKI on CKD IIIa Obstructive uropathy: Baseline creatinine appears to be mostly 1.3 to 1.4 in 2022-2023.Creatinine close to baseline at this time, tolerating orally.   Recent Labs  Lab 06/15/22 1516 06/16/22 0316 06/17/22 0112 06/18/22 0702  BUN 45* 41* 33* 27*  CREATININE 2.14* 1.73* 1.55* 1.42*    Normocytic anemia, likely anemia of chronic disease, hemoglobin level fluctuates 8 to 11 g.  Hemoglobin uptrending.  Initial drop likely hemodilution given significant volume resuscitation.Monitor Recent Labs  Lab 06/15/22 1516 06/16/22 0316 06/17/22 0112 06/18/22 0702  HGB 10.8* 8.8* 8.1* 8.7*  HCT 35.0* 28.7* 25.9* 28.3*   BPH with LUTS Prostate cancer history with bony mets stable Chronic pain disorder: Continue pain control, continue antibiotics as #1.  Continue long-term indwelling Foley catheter-changed 7/27-outpatient urology follow-up   Atrial fibrillation, chronic : on Eliquis SSS/Complete heart block s/p ICD-paced ekg. Dyslipidemia-on Lipitor and fibrate History of pulmonary embolism-cont Eliquis   Mild cognitive impairment with memory loss: Alert awake oriented to self which is baseline per wife . Continue Namenda, Remeron previously needed Seroquel for delirium during prior admission.   Deconditioning/debility/failure to thrive GOC: Palliative care has seen the patient again, wife plans to take him home tomorrow and does not have further hospitalization in the future, continue DNR.she was wondering if defibrillator can be turned off but after discussing with her son they would like to  hold off for now. Outpatient  palliative care follow-up  Consults: Palliative care, neurology Subjective: Alert awake oriented at baseline  Discharge Exam: Vitals:   06/18/22 0700 06/18/22 0800  BP: (!) 94/32 (!) 108/49  Pulse: 80 80  Resp: (!) 26 (!) 24  Temp:  98.3 F (36.8 C)  SpO2: 98% 96%   General: Pt is alert, awake, not in acute distress Cardiovascular: RRR, S1/S2 +, no rubs, no gallops Respiratory: CTA bilaterally, no wheezing, no rhonchi Abdominal: Soft, NT, ND, bowel sounds + Extremities: no edema, no cyanosis  Discharge Instructions  Discharge Instructions     Discharge instructions   Complete by: As directed    Please follow-up with outpatient palliative care/[facility]  Follow-up with PCP  Please call call MD or return to ER for similar or worsening recurring problem that brought you to hospital or if any fever,nausea/vomiting,abdominal pain, uncontrolled pain, chest pain,  shortness of breath or any other alarming symptoms.  Please follow-up your doctor as instructed in a week time and call the office for appointment.  Please avoid alcohol, smoking, or any other illicit substance and maintain healthy habits including taking your regular medications as prescribed.  You were cared for by a hospitalist during your hospital stay. If you have any questions about your discharge medications or the care you received while you were in the hospital after you are discharged, you can call the unit and ask to speak with the hospitalist on call if the hospitalist that took care of you is not available.  Once you are discharged, your primary care physician will handle any further medical issues. Please note that NO REFILLS for any  discharge medications will be authorized once you are discharged, as it is imperative that you return to your primary care physician (or establish a relationship with a primary care physician if you do not have one) for your aftercare needs so that they can reassess  your need for medications and monitor your lab values   Increase activity slowly   Complete by: As directed       Allergies as of 06/18/2022       Reactions   Nsaids Other (See Comments)   Cannot take due to defibrillator/pacemaker   Saw Palmetto Diarrhea   Vytorin [ezetimibe-simvastatin] Other (See Comments)   Myalgias    Adhesive [tape] Rash        Medication List     TAKE these medications    acetaminophen 500 MG tablet Commonly known as: TYLENOL Take 500 mg by mouth every 6 (six) hours as needed for mild pain, headache or fever.   atorvastatin 10 MG tablet Commonly known as: LIPITOR Take 10 mg by mouth at bedtime.   cefdinir 300 MG capsule Commonly known as: OMNICEF Take 1 capsule (300 mg total) by mouth 2 (two) times daily for 7 days.   Eliquis 5 MG Tabs tablet Generic drug: apixaban Take 1 tablet (5 mg total) by mouth 2 (two) times daily.   fenofibrate 145 MG tablet Commonly known as: TRICOR Take 145 mg by mouth daily.   memantine 10 MG tablet Commonly known as: NAMENDA Take 10 mg by mouth in the morning and at bedtime.   midodrine 10 MG tablet Commonly known as: PROAMATINE Take 1 tablet (10 mg total) by mouth 3 (three) times daily with meals.   mirtazapine 30 MG tablet Commonly known as: REMERON Take 30 mg by mouth at bedtime.   multivitamin tablet Take 1 tablet by mouth daily.   OMEGA-3 PO Take 1 capsule by mouth daily.   tamsulosin 0.4 MG Caps capsule Commonly known as: FLOMAX Take 0.4 mg by mouth daily.   Trospium Chloride 60 MG Cp24 Take 60 mg by mouth in the morning.        Follow-up Information     [facility] Follow up.   Why: [facility] will provide PT, OT, RN, and an aide in the home after discharge.        [facility] Follow up.   Specialty: PALLIATIVE CARE Why: [facility] will follow for outpatient palliative services after discharge. Contact information: [place] [id number] [phone]        Joneen Boers, MD Follow up in 1 week(s).   Specialty: Family Medicine Contact information: [address] [place] [address] [phone]                Allergies  Allergen Reactions   Nsaids Other (See Comments)    Cannot take due to defibrillator/pacemaker   Saw Palmetto Diarrhea   Vytorin [Ezetimibe-Simvastatin] Other (See Comments)    Myalgias    Adhesive [Tape] Rash    The results of significant diagnostics from this hospitalization (including imaging, microbiology, ancillary and laboratory) are listed below for reference.    Microbiology: Recent Results (from the past 240 hour(s))  Blood culture (routine x 2)     Status: None (Preliminary result)   Collection Time: 06/15/22  3:00 PM   Specimen: BLOOD  Result Value Ref Range Status   Specimen Description   Final    BLOOD RIGHT ANTECUBITAL Performed at [facility], [place] [address] [place], [place] [address]    Special Requests   Final    BOTTLES DRAWN AEROBIC AND ANAEROBIC Blood Culture adequate volume Performed at [place] [address] [place], [place] [address]    Culture   Final    NO GROWTH 3 DAYS Performed at [facility], [place] [address], [place], [place] [address]    Report Status PENDING  Incomplete  Blood culture (routine x 2)     Status: None (Preliminary result)   Collection Time: 06/15/22  3:45 PM   Specimen: BLOOD RIGHT ARM  Result Value Ref Range Status   Specimen Description   Final    BLOOD RIGHT ARM Performed at [facility], [place] [address] [place], [place] [address]    Special Requests   Final    BOTTLES DRAWN AEROBIC AND ANAEROBIC Blood Culture adequate volume Performed at [place] [address], [place], [place] [address]    Culture   Final    NO GROWTH 3 DAYS Performed at [facility], [place] [address], [place], [place]  [address]    Report Status PENDING  Incomplete  Urine Culture     Status: Abnormal   Collection Time: 06/15/22  4:35 PM   Specimen: Urine, Catheterized  Result Value Ref Range Status   Specimen Description   Final    URINE, CATHETERIZED Performed at [place] [address], [place], [place] [address]    Special Requests   Final    NONE Performed at [facility], [place] [address], [place], [place] [address]    Culture (A)  Final    >=100,000 COLONIES/mL ESCHERICHIA COLI >=100,000 COLONIES/mL PROTEUS MIRABILIS    Report Status 06/18/2022 FINAL  Final   Organism ID, Bacteria ESCHERICHIA COLI (A)  Final   Organism ID, Bacteria PROTEUS MIRABILIS (A)  Final      Susceptibility   Escherichia coli - MIC*    AMPICILLIN <=2 SENSITIVE Sensitive     CEFAZOLIN <=4 SENSITIVE Sensitive     CEFEPIME <=0.12 SENSITIVE Sensitive     CEFTRIAXONE <=0.25 SENSITIVE Sensitive     CIPROFLOXACIN <=0.25 SENSITIVE Sensitive     GENTAMICIN <=1 SENSITIVE Sensitive     IMIPENEM <=0.25 SENSITIVE Sensitive     NITROFURANTOIN 32 SENSITIVE Sensitive     TRIMETH/SULFA <=20 SENSITIVE Sensitive     AMPICILLIN/SULBACTAM <=2 SENSITIVE Sensitive     PIP/TAZO <=4 SENSITIVE Sensitive     * >=100,000 COLONIES/mL ESCHERICHIA COLI   Proteus mirabilis - MIC*    AMPICILLIN <=2 SENSITIVE Sensitive     CEFAZOLIN <=4 SENSITIVE Sensitive     CEFEPIME <=0.12 SENSITIVE Sensitive     CEFTRIAXONE <=0.25 SENSITIVE Sensitive     CIPROFLOXACIN <=0.25 SENSITIVE Sensitive     GENTAMICIN <=1 SENSITIVE Sensitive     IMIPENEM 2 SENSITIVE Sensitive     NITROFURANTOIN 128 RESISTANT Resistant     TRIMETH/SULFA <=20 SENSITIVE Sensitive     AMPICILLIN/SULBACTAM <=2 SENSITIVE Sensitive     PIP/TAZO <=4 SENSITIVE Sensitive     * >=100,000 COLONIES/mL PROTEUS MIRABILIS  MRSA Next Gen by PCR, Nasal     Status: None   Collection Time: 06/15/22 11:24 PM   Specimen: Nasal Mucosa; Nasal Swab  Result  Value Ref Range Status   MRSA by PCR Next Gen NOT DETECTED NOT DETECTED Final    Comment: (NOTE) The GeneXpert MRSA Assay (FDA approved for NASAL specimens  only), is one component of a comprehensive MRSA colonization surveillance program. It is not intended to diagnose MRSA infection nor to guide or monitor treatment for MRSA infections. Test performance is not FDA approved in patients less than 6 years old. Performed at [facility], [place] [address], [place], [place] [address]   C Difficile Quick Screen w PCR reflex     Status: Abnormal   Collection Time: 06/16/22 12:42 PM   Specimen: Stool  Result Value Ref Range Status   C Diff antigen POSITIVE (A) NEGATIVE Final   C Diff toxin NEGATIVE NEGATIVE Final   C Diff interpretation Results are indeterminate. See PCR results.  Final    Comment: Performed at [facility], [place] [address], [place], [place] [address]  Gastrointestinal Panel by PCR , Stool     Status: None   Collection Time: 06/16/22 12:42 PM   Specimen: Stool  Result Value Ref Range Status   Campylobacter species NOT DETECTED NOT DETECTED Final   Plesimonas shigelloides NOT DETECTED NOT DETECTED Final   Salmonella species NOT DETECTED NOT DETECTED Final   Yersinia enterocolitica NOT DETECTED NOT DETECTED Final   Vibrio species NOT DETECTED NOT DETECTED Final   Vibrio cholerae NOT DETECTED NOT DETECTED Final   Enteroaggregative E coli (EAEC) NOT DETECTED NOT DETECTED Final   Enteropathogenic E coli (EPEC) NOT DETECTED NOT DETECTED Final   Enterotoxigenic E coli (ETEC) NOT DETECTED NOT DETECTED Final   Shiga like toxin producing E coli (STEC) NOT DETECTED NOT DETECTED Final   Shigella/Enteroinvasive E coli (EIEC) NOT DETECTED NOT DETECTED Final   Cryptosporidium NOT DETECTED NOT DETECTED Final   Cyclospora cayetanensis NOT DETECTED NOT DETECTED Final   Entamoeba histolytica NOT DETECTED NOT DETECTED Final   Giardia lamblia NOT DETECTED  NOT DETECTED Final   Adenovirus F40/41 NOT DETECTED NOT DETECTED Final   Astrovirus NOT DETECTED NOT DETECTED Final   Norovirus GI/GII NOT DETECTED NOT DETECTED Final   Rotavirus A NOT DETECTED NOT DETECTED Final   Sapovirus (I, II, IV, and V) NOT DETECTED NOT DETECTED Final    Comment: Performed at [facility], [place] [place], [place] [address]  C. Diff by PCR, Reflexed     Status: None   Collection Time: 06/16/22 12:42 PM  Result Value Ref Range Status   Toxigenic C. Difficile by PCR NEGATIVE NEGATIVE Final    Comment: Patient is colonized with non toxigenic C. difficile. May not need treatment unless significant symptoms are present. Performed at [facility], [place] [address], [place],  [address]     Procedures/Studies: CT Renal Stone Study  Result Date: 06/15/2022 CLINICAL DATA:  Foreign body, GU tract Patient stents in place. History of obstructive uropathy EXAM: CT ABDOMEN AND PELVIS WITHOUT CONTRAST TECHNIQUE: Multidetector CT imaging of the abdomen and pelvis was performed following the standard protocol without IV contrast. RADIATION DOSE REDUCTION: This exam was performed according to the departmental dose-optimization program which includes automated exposure control, adjustment of the mA and/or kV according to patient size and/or use of iterative reconstruction technique. COMPARISON:  CT 05/14/2022. FINDINGS: Lower chest: Bibasilar scarring/linear atelectasis. Mild cardiomegaly with pacemaker/AICD leads in the right atrium and right ventricle. Hepatobiliary: No focal liver abnormality is seen. The gallbladder is unremarkable. Pancreas: Unremarkable. No pancreatic ductal dilatation or surrounding inflammatory changes. Spleen: Normal in size without focal abnormality. Adrenals/Urinary Tract: Adrenal glands are unremarkable. Atrophic left kidney. There are bilateral nephroureteral stents in place there is increased  prominence of right renal collecting system  and proximal ureter with decompression distally. No left-sided hydroureteronephrosis. The bladder is decompressed with Foley catheter in place. Stomach/Bowel: Mild distention of the stomach filled with fluid. There is no evidence of bowel obstruction.The appendix is normal. Moderate stool burden in the colon. Vascular/Lymphatic: Aortoiliac atherosclerosis. No AAA. No lymphadenopathy. Reproductive: Unremarkable. Other: There is a left inguinal hernia containing nonobstructed sigmoid colon. Fat containing right inguinal hernia. There is minimal stranding on the left. Musculoskeletal: Similar distribution of osseous sclerotic metastatic disease, particularly in the pelvis. No acute osseous abnormality. IMPRESSION: Bilateral nephroureteral stents are in place. Increased right sided hydronephrosis. Resolved left hydronephrosis. Left inguinal hernia containing non-obstructed sigmoid colon, with mild adjacent stranding, correlate with tenderness and reducibility. Unchanged sclerotic osseous metastatic disease. Electronically Signed   By: Maurine Simmering M.D.   On: 06/15/2022 17:20   DG Chest Portable 1 View  Result Date: 06/15/2022 CLINICAL DATA:  Questionable sepsis - evaluate for abnormality EXAM: PORTABLE CHEST 1 VIEW COMPARISON:  None Available. FINDINGS: Left chest wall dual lead pacemaker and defibrillator in grossly similar position. The heart and mediastinal contours are unchanged. Aortic calcification. No focal consolidation. No pulmonary edema. No pleural effusion. No pneumothorax. No acute osseous abnormality. IMPRESSION: 1. No active disease. 2.  Aortic Atherosclerosis (ICD10-I70.0). Electronically Signed   By: Iven Finn M.D.   On: 06/15/2022 16:00    Labs: BNP (last 3 results) No results for input(s): "BNP" in the last 8760 hours. Basic Metabolic Panel: Recent Labs  Lab 06/15/22 1516 06/16/22 0316 06/17/22 0112 06/18/22 0702  NA 146* 143 145 144  K 4.5 3.7 3.5 3.4*  CL 113* 110 119* 113*   CO2 '26 25 22 24  '$ GLUCOSE 133* 99 137* 83  BUN 45* 41* 33* 27*  CREATININE 2.14* 1.73* 1.55* 1.42*  CALCIUM 10.1 9.0 9.1 8.9   Liver Function Tests: Recent Labs  Lab 06/15/22 1516 06/16/22 0316 06/17/22 0112  AST '30 25 26  '$ ALT '14 10 11  '$ ALKPHOS 77 67 60  BILITOT 0.8 0.4 0.2*  PROT 7.0 5.4* 4.7*  ALBUMIN 3.0* 2.4* 2.0*   No results for input(s): "LIPASE", "AMYLASE" in the last 168 hours. No results for input(s): "AMMONIA" in the last 168 hours. CBC: Recent Labs  Lab 06/15/22 1516 06/16/22 0316 06/17/22 0112 06/18/22 0702  WBC 7.0 4.6 3.8* 5.6  NEUTROABS 5.7  --   --   --   HGB 10.8* 8.8* 8.1* 8.7*  HCT 35.0* 28.7* 25.9* 28.3*  MCV 90.0 90.8 90.9 90.4  PLT 330 251 246 286   Cardiac Enzymes: Recent Labs  Lab 06/17/22 0112  CKTOTAL 43*  CKMB 1.8   BNP: Invalid input(s): "POCBNP" CBG: No results for input(s): "GLUCAP" in the last 168 hours. D-Dimer No results for input(s): "DDIMER" in the last 72 hours. Hgb A1c No results for input(s): "HGBA1C" in the last 72 hours. Lipid Profile No results for input(s): "CHOL", "HDL", "LDLCALC", "TRIG", "CHOLHDL", "LDLDIRECT" in the last 72 hours. Thyroid function studies No results for input(s): "TSH", "T4TOTAL", "T3FREE", "THYROIDAB" in the last 72 hours.  Invalid input(s): "FREET3" Anemia work up No results for input(s): "VITAMINB12", "FOLATE", "FERRITIN", "TIBC", "IRON", "RETICCTPCT" in the last 72 hours. Urinalysis    Component Value Date/Time   COLORURINE AMBER (A) 06/15/2022 1635   APPEARANCEUR TURBID (A) 06/15/2022 1635   LABSPEC 1.015 06/15/2022 1635   PHURINE 8.0 06/15/2022 1635   GLUCOSEU NEGATIVE 06/15/2022 1635   HGBUR MODERATE (A) 06/15/2022 1635  BILIRUBINUR NEGATIVE 06/15/2022 [place] 06/15/2022 1635   PROTEINUR >=300 (A) 06/15/2022 1635   NITRITE NEGATIVE 06/15/2022 1635   LEUKOCYTESUR LARGE (A) 06/15/2022 1635   Sepsis Labs Recent Labs  Lab 06/15/22 1516 06/16/22 0316  06/17/22 0112 06/18/22 0702  WBC 7.0 4.6 3.8* 5.6   Microbiology Recent Results (from the past 240 hour(s))  Blood culture (routine x 2)     Status: None (Preliminary result)   Collection Time: 06/15/22  3:00 PM   Specimen: BLOOD  Result Value Ref Range Status   Specimen Description   Final    BLOOD RIGHT ANTECUBITAL Performed at [facility], [place] [address], [place], [place] [address]    Special Requests   Final    BOTTLES DRAWN AEROBIC AND ANAEROBIC Blood Culture adequate volume Performed at [place] [address] [place], [place] [address]    Culture   Final    NO GROWTH 3 DAYS Performed at [facility], [place] [address], [place], [place] [address]    Report Status PENDING  Incomplete  Blood culture (routine x 2)     Status: None (Preliminary result)   Collection Time: 06/15/22  3:45 PM   Specimen: BLOOD RIGHT ARM  Result Value Ref Range Status   Specimen Description   Final    BLOOD RIGHT ARM Performed at [facility], [place] [address], [place], [place] [address]    Special Requests   Final    BOTTLES DRAWN AEROBIC AND ANAEROBIC Blood Culture adequate volume Performed at [place] [address] [place], [place] [address]    Culture   Final    NO GROWTH 3 DAYS Performed at [facility], [place] [address], [place], [place] [address]    Report Status PENDING  Incomplete  Urine Culture     Status: Abnormal   Collection Time: 06/15/22  4:35 PM   Specimen: Urine, Catheterized  Result Value Ref Range Status   Specimen Description   Final    URINE, CATHETERIZED Performed at [place] [address], [place], [place] [address]    Special Requests   Final    NONE Performed at [facility], [place] [address], [place], [place] [address]    Culture (A)  Final    >=100,000 COLONIES/mL ESCHERICHIA COLI >=100,000 COLONIES/mL PROTEUS MIRABILIS    Report  Status 06/18/2022 FINAL  Final   Organism ID, Bacteria ESCHERICHIA COLI (A)  Final   Organism ID, Bacteria PROTEUS MIRABILIS (A)  Final      Susceptibility   Escherichia coli - MIC*    AMPICILLIN <=2 SENSITIVE Sensitive     CEFAZOLIN <=4 SENSITIVE Sensitive     CEFEPIME <=0.12 SENSITIVE Sensitive     CEFTRIAXONE <=0.25 SENSITIVE Sensitive     CIPROFLOXACIN <=0.25 SENSITIVE Sensitive     GENTAMICIN <=1 SENSITIVE Sensitive     IMIPENEM <=0.25 SENSITIVE Sensitive     NITROFURANTOIN 32 SENSITIVE Sensitive     TRIMETH/SULFA <=20 SENSITIVE Sensitive     AMPICILLIN/SULBACTAM <=2 SENSITIVE Sensitive     PIP/TAZO <=4 SENSITIVE Sensitive     * >=100,000 COLONIES/mL ESCHERICHIA COLI   Proteus mirabilis - MIC*    AMPICILLIN <=2 SENSITIVE Sensitive     CEFAZOLIN <=4 SENSITIVE Sensitive     CEFEPIME <=0.12 SENSITIVE Sensitive     CEFTRIAXONE <=0.25 SENSITIVE Sensitive     CIPROFLOXACIN <=0.25 SENSITIVE Sensitive  GENTAMICIN <=1 SENSITIVE Sensitive     IMIPENEM 2 SENSITIVE Sensitive     NITROFURANTOIN 128 RESISTANT Resistant     TRIMETH/SULFA <=20 SENSITIVE Sensitive     AMPICILLIN/SULBACTAM <=2 SENSITIVE Sensitive     PIP/TAZO <=4 SENSITIVE Sensitive     * >=100,000 COLONIES/mL PROTEUS MIRABILIS  MRSA Next Gen by PCR, Nasal     Status: None   Collection Time: 06/15/22 11:24 PM   Specimen: Nasal Mucosa; Nasal Swab  Result Value Ref Range Status   MRSA by PCR Next Gen NOT DETECTED NOT DETECTED Final    Comment: (NOTE) The GeneXpert MRSA Assay (FDA approved for NASAL specimens only), is one component of a comprehensive MRSA colonization surveillance program. It is not intended to diagnose MRSA infection nor to guide or monitor treatment for MRSA infections. Test performance is not FDA approved in patients less than 41 years old. Performed at [facility], [place] [address], [place], [place] [address]   C Difficile Quick Screen w PCR reflex     Status: Abnormal    Collection Time: 06/16/22 12:42 PM   Specimen: Stool  Result Value Ref Range Status   C Diff antigen POSITIVE (A) NEGATIVE Final   C Diff toxin NEGATIVE NEGATIVE Final   C Diff interpretation Results are indeterminate. See PCR results.  Final    Comment: Performed at [facility], [place] [address] [place], [place] [address]  Gastrointestinal Panel by PCR , Stool     Status: None   Collection Time: 06/16/22 12:42 PM   Specimen: Stool  Result Value Ref Range Status   Campylobacter species NOT DETECTED NOT DETECTED Final   Plesimonas shigelloides NOT DETECTED NOT DETECTED Final   Salmonella species NOT DETECTED NOT DETECTED Final   Yersinia enterocolitica NOT DETECTED NOT DETECTED Final   Vibrio species NOT DETECTED NOT DETECTED Final   Vibrio cholerae NOT DETECTED NOT DETECTED Final   Enteroaggregative E coli (EAEC) NOT DETECTED NOT DETECTED Final   Enteropathogenic E coli (EPEC) NOT DETECTED NOT DETECTED Final   Enterotoxigenic E coli (ETEC) NOT DETECTED NOT DETECTED Final   Shiga like toxin producing E coli (STEC) NOT DETECTED NOT DETECTED Final   Shigella/Enteroinvasive E coli (EIEC) NOT DETECTED NOT DETECTED Final   Cryptosporidium NOT DETECTED NOT DETECTED Final   Cyclospora cayetanensis NOT DETECTED NOT DETECTED Final   Entamoeba histolytica NOT DETECTED NOT DETECTED Final   Giardia lamblia NOT DETECTED NOT DETECTED Final   Adenovirus F40/41 NOT DETECTED NOT DETECTED Final   Astrovirus NOT DETECTED NOT DETECTED Final   Norovirus GI/GII NOT DETECTED NOT DETECTED Final   Rotavirus A NOT DETECTED NOT DETECTED Final   Sapovirus (I, II, IV, and V) NOT DETECTED NOT DETECTED Final    Comment: Performed at [facility] [place] [address] [place], [place] [address]  C. Diff by PCR, Reflexed     Status: None   Collection Time: 06/16/22 12:42 PM  Result Value Ref Range Status   Toxigenic C. Difficile by PCR NEGATIVE NEGATIVE Final    Comment: Patient is  colonized with non toxigenic C. difficile. May not need treatment unless significant symptoms are present. Performed at [facility], [place] [address], [place],  [address]      Time coordinating discharge: 35 minutes  SIGNED: Antonieta Pert, MD  [facility] 06/18/2022, 10:30 AM  If 7PM-7AM, please contact night-coverage [email or web address]

## 2022-06-18 NOTE — Plan of Care (Signed)
  Problem: Clinical Measurements: Goal: Respiratory complications will improve Outcome: Progressing   Problem: Urinary Elimination: Goal: Signs and symptoms of infection will decrease Outcome: Progressing

## 2022-06-20 LAB — CULTURE, BLOOD (ROUTINE X 2)
Culture: NO GROWTH
Culture: NO GROWTH
Special Requests: ADEQUATE
Special Requests: ADEQUATE

## 2022-06-21 ENCOUNTER — Other Ambulatory Visit: Payer: Self-pay

## 2022-06-21 ENCOUNTER — Encounter (HOSPITAL_COMMUNITY): Payer: Self-pay

## 2022-06-21 ENCOUNTER — Other Ambulatory Visit: Payer: 59

## 2022-06-21 ENCOUNTER — Encounter (HOSPITAL_COMMUNITY)
Admission: RE | Admit: 2022-06-21 | Discharge: 2022-06-21 | Disposition: A | Discharge status: Home / Self Care | Payer: Medicare Other | Source: Ambulatory Visit | Attending: Urology | Admitting: Urology

## 2022-06-21 ENCOUNTER — Ambulatory Visit: Payer: 59 | Admitting: Oncology

## 2022-06-21 VITALS — BP 102/54 | HR 81 | Temp 97.8°F | Ht 74.0 in | Wt 173.9 lb

## 2022-06-21 DIAGNOSIS — Z01818 Encounter for other preprocedural examination: Secondary | ICD-10-CM

## 2022-06-21 DIAGNOSIS — Z01812 Encounter for preprocedural laboratory examination: Secondary | ICD-10-CM | POA: Insufficient documentation

## 2022-06-21 HISTORY — DX: Malignant (primary) neoplasm, unspecified: C80.1

## 2022-06-21 HISTORY — DX: Anemia, unspecified: D64.9

## 2022-06-21 LAB — SURGICAL PCR SCREEN
MRSA, PCR: NEGATIVE
Staphylococcus aureus: NEGATIVE

## 2022-06-21 NOTE — Progress Notes (Addendum)
For Short Stay: [place] appointment date: Date of COVID positive in last 75 days:  Bowel Prep reminder:   For Anesthesia: PCP - Dr. Gilberto Better. Cardiologist - Dr. Tomie China.  Chest x-ray - 06/15/22 EKG - 06/17/22 Stress Test -  ECHO - 05/13/22 Cardiac Cath - 01/31/11 Pacemaker/ICD device last checked:06/14/22 Pacemaker orders received: requested. Device Rep notified:  Spinal Cord Stimulator:  Sleep Study -  CPAP -   Fasting Blood Sugar -  Checks Blood Sugar _____ times a day Date and result of last Hgb A1c-  Blood Thinner Instructions: Eliquis will be held 3 days before surgery as per Dr. Alroy Dust orders. Aspirin Instructions: Last Dose:  Activity level: Can go up a flight of stairs and activities of daily living without stopping and without chest pain and/or shortness of breath   Able to exercise without chest pain and/or shortness of breath   Unable to go up a flight of stairs without chest pain and/or shortness of breath     Anesthesia review: Hx: CHB,CKD III,Afib,Pacemaker  Patient denies shortness of breath, fever, cough and chest pain at PAT appointment   Patient verbalized understanding of instructions that were given to them at the PAT appointment. Patient was also instructed that they will need to review over the PAT instructions again at home before surgery.

## 2022-07-01 ENCOUNTER — Encounter (HOSPITAL_COMMUNITY)
Admission: RE | Disposition: A | Discharge status: Home / Self Care | Payer: Self-pay | Source: Ambulatory Visit | Attending: Urology

## 2022-07-01 ENCOUNTER — Ambulatory Visit (HOSPITAL_COMMUNITY): Payer: Medicare Other | Admitting: Physician Assistant

## 2022-07-01 ENCOUNTER — Ambulatory Visit (HOSPITAL_COMMUNITY): Payer: Medicare Other

## 2022-07-01 ENCOUNTER — Ambulatory Visit (HOSPITAL_COMMUNITY)
Admission: RE | Admit: 2022-07-01 | Discharge: 2022-07-01 | Disposition: A | Discharge status: Home / Self Care | Payer: Medicare Other | Source: Ambulatory Visit | Attending: Urology | Admitting: Urology

## 2022-07-01 ENCOUNTER — Encounter (HOSPITAL_COMMUNITY): Payer: Self-pay | Admitting: Urology

## 2022-07-01 ENCOUNTER — Ambulatory Visit (HOSPITAL_BASED_OUTPATIENT_CLINIC_OR_DEPARTMENT_OTHER): Payer: Medicare Other | Admitting: Anesthesiology

## 2022-07-01 ENCOUNTER — Other Ambulatory Visit: Payer: Self-pay

## 2022-07-01 DIAGNOSIS — I509 Heart failure, unspecified: Secondary | ICD-10-CM | POA: Diagnosis not present

## 2022-07-01 DIAGNOSIS — I4819 Other persistent atrial fibrillation: Secondary | ICD-10-CM | POA: Diagnosis not present

## 2022-07-01 DIAGNOSIS — C7951 Secondary malignant neoplasm of bone: Secondary | ICD-10-CM | POA: Diagnosis not present

## 2022-07-01 DIAGNOSIS — Z95810 Presence of automatic (implantable) cardiac defibrillator: Secondary | ICD-10-CM | POA: Diagnosis not present

## 2022-07-01 DIAGNOSIS — M1990 Unspecified osteoarthritis, unspecified site: Secondary | ICD-10-CM | POA: Diagnosis not present

## 2022-07-01 DIAGNOSIS — R339 Retention of urine, unspecified: Secondary | ICD-10-CM | POA: Insufficient documentation

## 2022-07-01 DIAGNOSIS — F0280 Dementia in other diseases classified elsewhere without behavioral disturbance: Secondary | ICD-10-CM | POA: Diagnosis not present

## 2022-07-01 DIAGNOSIS — N200 Calculus of kidney: Secondary | ICD-10-CM

## 2022-07-01 DIAGNOSIS — N201 Calculus of ureter: Secondary | ICD-10-CM | POA: Diagnosis present

## 2022-07-01 DIAGNOSIS — I071 Rheumatic tricuspid insufficiency: Secondary | ICD-10-CM | POA: Insufficient documentation

## 2022-07-01 DIAGNOSIS — N183 Chronic kidney disease, stage 3 unspecified: Secondary | ICD-10-CM | POA: Diagnosis not present

## 2022-07-01 DIAGNOSIS — N132 Hydronephrosis with renal and ureteral calculous obstruction: Secondary | ICD-10-CM | POA: Diagnosis not present

## 2022-07-01 DIAGNOSIS — N135 Crossing vessel and stricture of ureter without hydronephrosis: Secondary | ICD-10-CM | POA: Insufficient documentation

## 2022-07-01 DIAGNOSIS — C61 Malignant neoplasm of prostate: Secondary | ICD-10-CM

## 2022-07-01 DIAGNOSIS — Z7901 Long term (current) use of anticoagulants: Secondary | ICD-10-CM | POA: Insufficient documentation

## 2022-07-01 DIAGNOSIS — N139 Obstructive and reflux uropathy, unspecified: Secondary | ICD-10-CM

## 2022-07-01 DIAGNOSIS — F0390 Unspecified dementia without behavioral disturbance: Secondary | ICD-10-CM

## 2022-07-01 DIAGNOSIS — C799 Secondary malignant neoplasm of unspecified site: Secondary | ICD-10-CM

## 2022-07-01 HISTORY — PX: INSERTION OF SUPRAPUBIC CATHETER: SHX5870

## 2022-07-01 HISTORY — PX: CYSTOSCOPY/URETEROSCOPY/HOLMIUM LASER/STENT PLACEMENT: SHX6546

## 2022-07-01 SURGERY — CYSTOSCOPY/URETEROSCOPY/HOLMIUM LASER/STENT PLACEMENT
Anesthesia: General | Laterality: Bilateral

## 2022-07-01 MED ORDER — CHLORHEXIDINE GLUCONATE 0.12 % MT SOLN: 15.0000 mL | Freq: Once | OROMUCOSAL | Status: DC

## 2022-07-01 MED ORDER — LIDOCAINE 2% (20 MG/ML) 5 ML SYRINGE
INTRAMUSCULAR | Status: DC | PRN
  Administered 2022-07-01: 60 mg via INTRAVENOUS

## 2022-07-01 MED ORDER — ONDANSETRON HCL 4 MG/2ML IJ SOLN
INTRAMUSCULAR | Status: DC | PRN
  Administered 2022-07-01: 4 mg via INTRAVENOUS

## 2022-07-01 MED ORDER — ORAL CARE MOUTH RINSE: 15.0000 mL | Freq: Once | OROMUCOSAL | Status: DC

## 2022-07-01 MED ORDER — FENTANYL CITRATE (PF) 100 MCG/2ML IJ SOLN
INTRAMUSCULAR | Status: DC | PRN
Start: 2022-07-01 — End: 2022-07-01
  Administered 2022-07-01 (×3): 25 ug via INTRAVENOUS

## 2022-07-01 MED ORDER — DOCUSATE SODIUM 100 MG PO CAPS: 100.0000 mg | ORAL_CAPSULE | Freq: Every day | ORAL | 0 refills | Status: AC | PRN

## 2022-07-01 MED ORDER — EPHEDRINE SULFATE-NACL 50-0.9 MG/10ML-% IV SOSY
PREFILLED_SYRINGE | INTRAVENOUS | Status: DC | PRN
  Administered 2022-07-01: 5 mg via INTRAVENOUS

## 2022-07-01 MED ORDER — PROPOFOL 10 MG/ML IV BOLUS
INTRAVENOUS | Status: DC | PRN
  Administered 2022-07-01 (×4): 10 mg via INTRAVENOUS
  Administered 2022-07-01: 40 mg via INTRAVENOUS

## 2022-07-01 MED ORDER — FENTANYL CITRATE (PF) 100 MCG/2ML IJ SOLN
INTRAMUSCULAR | Status: AC
  Filled 2022-07-01: qty 2

## 2022-07-01 MED ORDER — PHENYLEPHRINE HCL-NACL 20-0.9 MG/250ML-% IV SOLN
INTRAVENOUS | Status: DC | PRN
  Administered 2022-07-01: 40 ug/min via INTRAVENOUS

## 2022-07-01 MED ORDER — CEFAZOLIN SODIUM-DEXTROSE 2-4 GM/100ML-% IV SOLN
2.0000 g | Freq: Once | INTRAVENOUS | Status: AC
  Administered 2022-07-01: 2 g via INTRAVENOUS
  Filled 2022-07-01: qty 100

## 2022-07-01 MED ORDER — LIDOCAINE 2% (20 MG/ML) 5 ML SYRINGE
INTRAMUSCULAR | Status: AC
Start: 2022-07-01 — End: ?
  Filled 2022-07-01: qty 5

## 2022-07-01 MED ORDER — ETOMIDATE 2 MG/ML IV SOLN
INTRAVENOUS | Status: DC | PRN
  Administered 2022-07-01: 2 mg via INTRAVENOUS
  Administered 2022-07-01: 6 mg via INTRAVENOUS

## 2022-07-01 MED ORDER — OXYCODONE-ACETAMINOPHEN 5-325 MG PO TABS: 1.0000 | ORAL_TABLET | ORAL | 0 refills | Status: AC | PRN

## 2022-07-01 MED ORDER — BACITRACIN ZINC 500 UNIT/GM EX OINT
TOPICAL_OINTMENT | CUTANEOUS | Status: AC
Start: 2022-07-01 — End: ?
  Filled 2022-07-01: qty 28.35

## 2022-07-01 MED ORDER — LACTATED RINGERS IV SOLN: INTRAVENOUS | Status: DC

## 2022-07-01 MED ORDER — ONDANSETRON HCL 4 MG/2ML IJ SOLN: 4.0000 mg | Freq: Once | INTRAMUSCULAR | Status: DC | PRN

## 2022-07-01 MED ORDER — STERILE WATER FOR IRRIGATION IR SOLN
Status: DC | PRN
  Administered 2022-07-01: 100 mL

## 2022-07-01 MED ORDER — SODIUM CHLORIDE 0.9 % IR SOLN
Status: DC | PRN
  Administered 2022-07-01: 4000 mL via INTRAVESICAL

## 2022-07-01 MED ORDER — 0.9 % SODIUM CHLORIDE (POUR BTL) OPTIME
TOPICAL | Status: DC | PRN
  Administered 2022-07-01: 1000 mL

## 2022-07-01 MED ORDER — ETOMIDATE 2 MG/ML IV SOLN
INTRAVENOUS | Status: AC
Start: 2022-07-01 — End: ?
  Filled 2022-07-01: qty 10

## 2022-07-01 MED ORDER — FENTANYL CITRATE PF 50 MCG/ML IJ SOSY: 25.0000 ug | PREFILLED_SYRINGE | INTRAMUSCULAR | Status: DC | PRN

## 2022-07-01 MED ORDER — PHENYLEPHRINE HCL-NACL 20-0.9 MG/250ML-% IV SOLN
INTRAVENOUS | Status: AC
  Filled 2022-07-01: qty 250

## 2022-07-01 MED ORDER — IOHEXOL 300 MG/ML  SOLN
INTRAMUSCULAR | Status: DC | PRN
  Administered 2022-07-01: 13 mL

## 2022-07-01 MED ORDER — DEXAMETHASONE SODIUM PHOSPHATE 10 MG/ML IJ SOLN
INTRAMUSCULAR | Status: DC | PRN
  Administered 2022-07-01: 8 mg via INTRAVENOUS

## 2022-07-01 MED ORDER — DEXAMETHASONE SODIUM PHOSPHATE 10 MG/ML IJ SOLN
INTRAMUSCULAR | Status: AC
Start: 2022-07-01 — End: ?
  Filled 2022-07-01: qty 1

## 2022-07-01 MED ORDER — ONDANSETRON HCL 4 MG/2ML IJ SOLN
INTRAMUSCULAR | Status: AC
Start: 2022-07-01 — End: ?
  Filled 2022-07-01: qty 2

## 2022-07-01 MED ORDER — ACETAMINOPHEN 10 MG/ML IV SOLN
1000.0000 mg | Freq: Once | INTRAVENOUS | Status: AC
  Administered 2022-07-01: 1000 mg via INTRAVENOUS
  Filled 2022-07-01: qty 100

## 2022-07-01 MED ORDER — CIPROFLOXACIN HCL 500 MG PO TABS: 500.0000 mg | ORAL_TABLET | Freq: Two times a day (BID) | ORAL | 0 refills | Status: AC

## 2022-07-01 MED ORDER — EPHEDRINE 5 MG/ML INJ
INTRAVENOUS | Status: AC
Start: 2022-07-01 — End: ?
  Filled 2022-07-01: qty 5

## 2022-07-01 MED ORDER — PHENYLEPHRINE 80 MCG/ML (10ML) SYRINGE FOR IV PUSH (FOR BLOOD PRESSURE SUPPORT)
PREFILLED_SYRINGE | INTRAVENOUS | Status: AC
Start: 2022-07-01 — End: ?
  Filled 2022-07-01: qty 10

## 2022-07-01 MED ORDER — PROPOFOL 10 MG/ML IV BOLUS
INTRAVENOUS | Status: AC
  Filled 2022-07-01: qty 20

## 2022-07-01 MED ORDER — ACETAMINOPHEN 500 MG PO TABS
1000.0000 mg | ORAL_TABLET | Freq: Once | ORAL | Status: DC
  Filled 2022-07-01: qty 2

## 2022-07-01 SURGICAL SUPPLY — 30 items
BAG URO CATCHER STRL LF (MISCELLANEOUS) ×3 IMPLANT
BASKET LASER NITINOL 1.9FR (BASKET) ×1 IMPLANT
BASKET ZERO TIP NITINOL 2.4FR (BASKET) IMPLANT
CATH FOLEY 2WAY SLVR  5CC 16FR (CATHETERS) ×2
CATH FOLEY 2WAY SLVR 5CC 16FR (CATHETERS) IMPLANT
CATH FOLEY INTRO SUPRA 16F (CATHETERS) ×1 IMPLANT
CATH URET FLEX-TIP 2 LUMEN 10F (CATHETERS) ×1 IMPLANT
CATH URETL OPEN 5X70 (CATHETERS) ×3 IMPLANT
CLOTH BEACON ORANGE TIMEOUT ST (SAFETY) ×3 IMPLANT
FIBER LASER MOSES 200 DFL (Laser) IMPLANT
GAUZE SPONGE 4X4 12PLY STRL (GAUZE/BANDAGES/DRESSINGS) ×1 IMPLANT
GLOVE BIOGEL M 7.0 STRL (GLOVE) ×3 IMPLANT
GOWN STRL REUS W/ TWL XL LVL3 (GOWN DISPOSABLE) ×2 IMPLANT
GOWN STRL REUS W/TWL XL LVL3 (GOWN DISPOSABLE) ×2
GUIDEWIRE STR DUAL SENSOR (WIRE) ×6 IMPLANT
GUIDEWIRE ZIPWRE .038 STRAIGHT (WIRE) IMPLANT
KIT TURNOVER KIT A (KITS) IMPLANT
LASER FIB FLEXIVA PULSE ID 365 (Laser) IMPLANT
MANIFOLD NEPTUNE II (INSTRUMENTS) ×3 IMPLANT
PACK CYSTO (CUSTOM PROCEDURE TRAY) ×3 IMPLANT
SHEATH NAVIGATOR HD 12/14X46 (SHEATH) ×1 IMPLANT
SHEATH URETERAL 12FR 45CM (SHEATH) IMPLANT
STENT URET 6FRX26 CONTOUR (STENTS) ×2 IMPLANT
SUT PROLENE 2 0 SH DA (SUTURE) ×1 IMPLANT
SUT VIC AB 2-0 SH 27 (SUTURE) ×1
SUT VIC AB 2-0 SH 27X BRD (SUTURE) IMPLANT
TRACTIP FLEXIVA PULS ID 200XHI (Laser) IMPLANT
TRACTIP FLEXIVA PULSE ID 200 (Laser) ×2
TUBING CONNECTING 10 (TUBING) ×3 IMPLANT
TUBING UROLOGY SET (TUBING) ×3 IMPLANT

## 2022-07-01 NOTE — Op Note (Signed)
Operative Note  Preoperative diagnosis:  1.  Bilateral ureteral stones 2. Urinary retention 3. Metastatic prostate cancer  Postoperative diagnosis: 1.  Right renal stone 2. Left ureteral distal stricture 3. Urinary retention 4. Metastatic prostate cancer  Procedure(s): 1.  Cystoscopy 2. Right ureteroscopy with laser lithotripsy and basket extraction of stones 2. Left diagnostic ureteroscopy 3. Bilateral retrograde pyelogram 4. Bilateral ureteral stent exchange 5. Fluoroscopy with intraoperative interpretation 6. Suprapubic tube placement  Surgeon: Rexene Alberts, MD  Assistants:  None  Anesthesia:  General  Complications:  None  EBL:  Minimal  Specimens: 1. none  Drains/Catheters: 1.  Right 6Fr x 26cm ureteral stent WITHOUT tether string 2. Left 6Fr x 26cm ureteral stent WITHOUT tether string 3.  16 French suprapubic tube  Intraoperative findings:   Cystoscopy demonstrated wide open prostatic urethra.  There is significant edema around the left ureteral orifice.  Evidence of metastatic prostate cancer growing along the left-sided trigone.   Right ureteroscopy demonstrated approximately 7 mm right renal stone.  This was successfully fragmented, basket extracted and dusted.  Successful right ureteral stent exchange. Left diagnostic ureteroscopy demonstrated distal left ureteral stricture approximately 2 cm from the left ureteral orifice into the distal left ureter.  Above this, there was severe left hydroureteronephrosis.  No stone seen along the course of the ureter. Bilateral retrograde pyelogram demonstrated severe left-sided hydronephrosis and moderate right-sided hydronephrosis. Successful bilateral ureteral stent exchange. Successful suprapubic tube placement with 16 French Foley. Ureteroscopy demonstrated   Indication:  Eric Krueger is a 76 y.o. male with history of metastatic prostate cancer and urinary retention along with bilateral ureteral stones.  He is  being brought back to the operating today for definitive treatment of the stones and to place a suprapubic tube.  Description of procedure: After informed consent was obtained from the patient, the patient was identified and taken to the operating room and placed in the supine position.  General anesthesia was administered as well as perioperative IV antibiotics.  At the beginning of the case, a time-out was performed to properly identify the patient, the surgery to be performed, and the surgical site.  Sequential compression devices were applied to the lower extremities at the beginning of the case for DVT prophylaxis.  The patient was then placed in the dorsal lithotomy supine position, prepped and draped in sterile fashion.  We then passed the 21-French rigid cystoscope through the urethra and into the bladder under vision without any difficulty, noting a normal urethra without strictures and a wide open prostatic fossa.  He does have metastatic prostate cancer and there was evidence of tumor growing along the left-sided trigone.  There is significant edema around the left ureteral orifice.  Bilateral ureteral stents were seen protruding through the bilateral ureters.  I elected to place a 0.038 sensor wires alongside bilateral stents prior to removing the stents.  I then used alligator tooth forceps to grab the distal end of the left and then subsequently right ureteral stent and brought this out the ureteral meatus.  I watched the proximal coil straight up nicely on fluoroscopy.  Through bilateral ureteral stents, we then passed a separate 0.038 sensor wire up to level the renal pelvis.  The stent was then removed, leaving the sensor wires up the ureters.  I then passed a dual-lumen catheter was successfully over each ureteral stent and performed a retrograde pyelogram.  There was seen to be a distal left ureteral stricture at the level of the left UPJ extending approximately  2 cm.  There is an severe left  hydroureteronephrosis proximal to this.  There is moderate right hydronephrosis.  A semi-rigid ureteroscope was passed alongside the wire up the distal left ureter which was stenotic at the left ureteral orifice extending approximately 2 cm.  I was able to navigate the scope in between both wires and advanced this beyond the area of stricture.  There is no stones identified in this location.  I then advanced the semirigid as proximally as I could and identified no stones in the ureter.  There were no stones within his left kidney on preoperative CT imaging.  I then withdrew the scope, leaving the wires in place.  A semi-rigid ureteroscope was passed alongside the wire up the distal right ureter which appeared normal.  I then advanced a flexible digital scope over one of the guidewires on the right side up to the level UPJ.  I then surveyed the kidney identified 7 mm stone.  I withdrew the scope into the proximal ureter and there was no stones identified within the ureter.  I then replaced the guidewire.  A 12/14 French ureteral access sheath was then carefully advanced over the sensor wire to the level of the UPJ under fluoroscopic guidance.  I visualized a 7 mm stone in her left upper pole calyx.  Using the 200 m holmium laser fiber, the stone was fragmented completely.  An escape basket was then used to remove the fragments under visual guidance.  The remaining fragments were dusted into small pieces.  These pieces was monitored with laser fiber.  There is the fragments were very tiny and these were irrigated away gently.  The calyces were reinspected and there was no significant stone fragment residual.  I then performed a retrograde pyelogram and systematically identify each calyx noting no other stone fragments present.  We then withdrew the ureteroscope back down the ureter along with the access sheath, noting no evidence of any stones along the course of the ureter.  Prior to removing the ureteroscope,  we did pass the Glidewire back up to the ureter to the renal pelvis.  Once the ureteroscope was removed, the Glidewire was backloaded through the rigid cystoscope, which was then advanced down the urethra and into the bladder. We then used the Glidewire under direct vision through the rigid cystoscope and under fluoroscopic guidance and passed up a 6-French, 26 cm double-pigtail ureteral stent up ureter, making sure that the proximal and distal ends coiled within the kidney and bladder respectively.  I did not leave a tether string.  I also placed a 6 [place] by 26 cm stent into his left kidney over the existing guidewire.  I did not leave a tether string.  Both stents are in appropriate position with the proximal curl was within the renal pelvis and distal curl within the bladder respectively.  The patient was placed into Trendelenburg position.  After placing the patient into Trendelenburg, we located his pubic symphysis.  Next, a 1 cm vertical incision was performed 2 fingerbreadths superior to the pubic symphysis at the location of his prior SPT scar site.  We then introduced and spread the subcutaneous tissues using a Kelly. The fascia was opened with a 15 blade.  We then introduced a spinal needle into the bladder and noted this was introducing into the midline on the dome of the bladder with good urine flow out of the needle.  We then removed the spinal needle and then introduced the trocar into the  bladder. A 16-French Foley catheter was placed through the trocar into the bladder. We then injected 10 mL of sterile water into the balloon.  The Foley was sutured in place using 2-0 Prolene sutures. We applied gauze dressing and tape over the site. The patient tolerated the procedure well and there was no complication. Patient was awoken from anesthesia and taken to the recovery room in stable condition. I was present and scrubbed for the entirety of the case.  Plan:  Discharge home. F/u in the office as  scheduled.  I will plan to remove his right ureteral stent in the office in 1 week.  We will keep his left ureteral stent in place given his distal left ureteral stricture.  This to be exchanged every 6 months.  He will have his first SPT exchange performed in the office in 1 month.  Operative findings were discussed with his wife.  Matt R. [facility]  Pager: [phone]

## 2022-07-01 NOTE — Transfer of Care (Signed)
Immediate Anesthesia Transfer of Care Note  Patient: Eric Krueger  Procedure(s) Performed: CYSTOSCOPY/ RETROGRADE/URETEROSCOPY/HOLMIUM LASER/STENT PLACEMENT (Bilateral) INSERTION OF SUPRAPUBIC CATHETER  Patient Location: PACU  Anesthesia Type:General  Level of Consciousness: sedated  Airway & Oxygen Therapy: Patient Spontanous Breathing and Patient connected to face mask oxygen  Post-op Assessment: Report given to RN and Post -op Vital signs reviewed and stable  Post vital signs: Reviewed and stable  Last Vitals:  Vitals Value Taken Time  BP 111/59 07/01/22 0906  Temp 36.6 C 07/01/22 0906  Pulse 67 07/01/22 0906  Resp 16 07/01/22 0906  SpO2 100 % 07/01/22 0906  Vitals shown include unvalidated device data.  Last Pain:  Vitals:   07/01/22 0616  TempSrc: Oral         Complications: No notable events documented.

## 2022-07-01 NOTE — H&P (Signed)
Office Visit Report     05/26/2022    CC/HPI: Eric Krueger is a 76 year old male seen in followup with urinary retention and metastatic prostate cancer.   Of note, he has a past medical history of Alzheimer's dementia, atrial fibrillation s/p pacemaker and defibrillator. He is on Eliquis. He follows with his cardiologist, Dr. Tomie China. He is seen today with his wife [name]. Most of HPI is obtained from her as he does not talk much.   1. Urinary retention: He presented to ED on 07/06/2021 and found to be in urinary retention and acute renal failure. Renal ultrasound demonstrated moderate bilateral hydronephrosis. Creatinine was 13. Foley catheter was placed with return of 750 cc urine. His creatinine down trended and was most recently 1.4 on 07/08/2021 at his baseline. Renal ultrasound 07/07/2021 demonstrated resolved hydronephrosis. He failed a void trial on 07/22/2021.  TRUS 08/10/2021 demonstrated 65cm^3 prostate  Cystoscopy was withheld given concern of possible UTI.  PSA on 07/29/2021 was 3.26  I did counsel regarding possibility of hypotonic bladder, however we elected not to proceed with UDS and discussed that TURP was best chance of being catheter free.  -S/p TURP and bladder biopsy 09/17/2021 with path GS 5+4=9 adenocarcinoma of the prostate also in the bladder trigone biopsy  -Failed multiple void trials. He declined urodynamics.  -He is now managed with a suprapubic tube, placed by interventional radiology on 04/14/2022. This is since come dislodged and he has now managed with indwelling Foley catheter. He would like to replace suprapubic tube and this will be arranged in the operating room.   2. Castrate sensitive metastatic prostate cancer:  -S/p TURP 09/17/2021 with GS 5+4=9 adenocarcinoma of the prostate also in the bladder trigone biopsy  -Pre-op PSA on 07/29/2021 was 3.26  -CT A/P 09/26/2021 performed in [place] demonstrated pelvic lymphadenopathy and pelvic bony sclerotic lesions.   -PSMA PET scan 10/12/2021 with extensive nodal metastasis in the iliac nodes, periaortic retroperitoneal nodes and extending to the mediastinum. He has a tiny right supraclavicular metastatic node. He has extensive skeletal metastasis involving the pelvis, proximal femurs, spine and sternum.  -CT A/P 03/10/2022 with interval decrease in size of retroperitoneal and pelvic sidewall lymphadenopathy. Skeletal metastasis have become more densely cirrhotic suggesting healing.  -Treatment: ADT. He has declined oral antiandrogens and chemotherapy.  -$RemoveBeforeD'240mg'[id number]$  Firmagon given on 09/27/2021. 45 mg Eligard given on 11/02/2021, 05/02/2022.  -Most recent PSA: 12 on 04/26/2022 which represents a rise from 1.7 on 01/25/2022.  -He has seen Dr. Alen Blew and elected not to proceed with any chemotherapy or oral antiandrogens.  -He denies a FH of prostate cancer  -He denies bone pain. He has noticed about 7 pounds of weight loss over the past month. He is not eating as well. His wife has tried supplementing with Ensures.   #3. Bilateral ureteral stones:  -He was found to have bilateral ureteral stones and underwent bilateral stent placement on 05/10/2022 with Dr. Gloriann Loan.  -He is scheduled for bilateral ureteroscopy with suprapubic tube placement on 07/01/2022.     ALLERGIES: Adhesive Aspirin    MEDICATIONS: Diflucan 150 mg tablet 1 tablet PO every 72 hours Take every 72 hours for 3 doses  Tamsulosin Hcl 0.4 mg capsule  Atorvastatin Calcium 10 mg tablet  Eliquis 5 mg tablet  Erleada 60 mg tablet 4 tablet PO Daily  Fenofibrate 145 mg tablet  Memantine Hcl 10 mg tablet  Mirtazapine 30 mg tablet  Trospium Chloride Er 60 mg capsule, ext release 24 hr 1 capsule  PO Daily     GU PSH: Cystoscopy TURP - 09/17/2021     NON-GU PSH: None   GU PMH: Urinary Retention - 05/10/2022, - 05/02/2022, - 03/16/2022, - 02/01/2022, - 12/21/2021, - 11/04/2021, - 11/02/2021, - 10/28/2021 (Stable), - 10/07/2021, - 08/18/2021, - 07/29/2021, -  07/22/2021 Prostate Cancer - 05/02/2022, - 03/16/2022, - 03/10/2022, - 02/01/2022, - 12/21/2021, - 11/04/2021, - 11/02/2021, - 10/07/2021 Secondary bone metastases - 05/02/2022, - 03/16/2022 Hydronephrosis - 03/16/2022, - 11/02/2021 BPH w/LUTS - 12/21/2021, - 09/20/2021, - 08/18/2021, - 07/29/2021 Acute Cystitis/UTI (Stable) - 11/04/2021, - 10/28/2021, - 08/18/2021 Encounter for Prostate Cancer screening - 08/18/2021, - 07/29/2021 Kidney Failure Unspec      PMH Notes: defibrillator    NON-GU PMH: Fever, unspecified - 05/10/2022 Arrhythmia Atrial Fibrillation Depression Hypercholesterolemia    FAMILY HISTORY: None   SOCIAL HISTORY: Marital Status: Married Current Smoking Status: Patient has never smoked.   Tobacco Use Assessment Completed: Used Tobacco in last 30 days? Does not use smokeless tobacco. Has never drank.  Drinks 1 caffeinated drink per day. Patient's occupation Technical brewer.    REVIEW OF SYSTEMS:    GU Review Male:   Patient denies frequent urination, hard to postpone urination, burning/ pain with urination, get up at night to urinate, leakage of urine, stream starts and stops, trouble starting your stream, have to strain to urinate , erection problems, and penile pain.  Gastrointestinal (Upper):   Patient denies nausea, vomiting, and indigestion/ heartburn.  Gastrointestinal (Lower):   Patient denies diarrhea and constipation.  Constitutional:   Patient denies fever, night sweats, weight loss, and fatigue.  Skin:   Patient denies skin rash/ lesion and itching.  Eyes:   Patient denies blurred vision and double vision.  Ears/ Nose/ Throat:   Patient denies sore throat and sinus problems.  Hematologic/Lymphatic:   Patient denies swollen glands and easy bruising.  Cardiovascular:   Patient denies leg swelling and chest pains.  Respiratory:   Patient denies cough and shortness of breath.  Endocrine:   Patient denies excessive thirst.  Musculoskeletal:   Patient denies back  pain and joint pain.  Neurological:   Patient denies headaches and dizziness.  Psychologic:   Patient denies depression and anxiety.   VITAL SIGNS: None   MULTI-SYSTEM PHYSICAL EXAMINATION:    Constitutional: Well-nourished. No physical deformities. Normally developed. Good grooming.  Respiratory: No labored breathing, no use of accessory muscles.   Cardiovascular: Normal temperature, normal extremity pulses, no swelling, no varicosities.  Gastrointestinal: No mass, no tenderness, no rigidity, non obese abdomen.     Complexity of Data:  Source Of History:  Patient, Medical Record Summary  Records Review:   Previous Doctor Records  Urine Test Review:   Urinalysis  X-Ray Review: C.T. Abdomen/Pelvis: Reviewed Films. Reviewed Report. Discussed With Patient.     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 Urinary retention  -Required Foley catheter placement on 07/06/2021. Failed void trial on 07/22/2021.  -TRUS 08/18/2021 with 65cm^3 prostate  -S/p TURP and bladder biopsy 09/17/2021 with path GS 5+4=9 adenocarcinoma of the prostate also in the bladder trigone biopsy  -He has failed several void trials.  -He declines urodynamics.  -Suprapubic tube  placed by IR on 04/14/2022. This has come dislodged.  -We will plan for suprapubic placement in the operating room on 07/01/2022 with concomitant bilateral uroscopy with laser lithotripsy.   2. Castration sensitive metastatic prostate cancer:  -Dx [id number] dx 10/28/222 with GS 5+4 =9, pre-procedure PSA 3.26 on 07/29/2021  -S/p TURP 09/17/2021 with GS 5+4=9 adenocarcinoma of the prostate also in the bladder trigone biopsy  -CT A/P 09/26/2021 performed in [place] demonstrated pelvic lymphadenopathy and pelvic bony sclerotic lesions.  -PSMA PET with metastatic disease as above  -CT A/P 02/2022 with decreased size and retroperitoneal and pelvic lymphadenopathy.  Treatment: ADT. He has been offered oral antiandrogens and chemotherapy and has met with Dr. Arna Medici declined these options.  $RemoveB'240mg'ZSKnpWkw$  Firmagon given on 09/27/2021. 45 mg Eligard given on 11/02/2021 In today, 05/02/2022.  -We discussed that his most recent PSA is 12.0 which represents a rise from 1.7 in 01/2022. We did discuss that he is likely progressing to castration resistant metastatic prostate cancer. This will be confirmed on subsequent PSA evaluations. He has met with Dr. Alen Blew has declined further oral antiandrogen therapy.   #3. Bilateral stones: He is s/p bilateral stent placement on 05/10/2022 by Dr. Gloriann Loan. He has bilateral ureteroscopy with laser lithotripsy and suprapubic tube placement scheduled for 07/01/2022. We will plan for preoperative treatment with antibiotics given indwelling Foley catheter.   CC: Dr. Marguarite Arbour, MD  CC: Zola Button, MD        Next Appointment:      Next Appointment: 07/01/2022 07:30 AM    Appointment Type: Surgery     Location: [facility] [phone]    Provider: Rexene Alberts, M.D.    Reason for Visit: [facility]/OP CYSTO, BL RPG, BL URS/LL, BL STENT EXCHANGE, SUPRA PUBIC TUBE PLACEMENT    Urology Preoperative H&P   Chief Complaint: bilateral ureteral stones, urinary retention  History  of Present Illness: Eric Krueger is a 76 y.o. male with a history of bilateral ureteral stones s/p bilateral ureteral stents by Dr. Gloriann Loan on 05/10/2022 and urinary retention currently managed with indwelling foley. Ucx 06/15/2022 with >100K E coli. He was treated with IV abx and transitioned to cefdinir for one week following. He has been on bactrim prophylactic dose prior to surgery today. He denies fevers or chills. Of note, he has dementia and was seen with his wife this morning.  Past Medical History:  Diagnosis Date   AKI (acute kidney injury) ([place]) 05/10/2022   Anemia    Anticoagulant long-term use    eliquis--- managed by cardiology   BPH with urinary obstruction    urologist-- dr Maddyn Lieurance   Cancer [facility])    CHB (complete heart block) ([place])    CKD (  chronic kidney disease), stage III ([place])    Dual implantable cardioverter-defibrillator in situ 03/2011   followed by cardiologist-- dr Rowland Lathe--   pt had DDD pacemaker and changed out for [facility] Jude)  Dual ICD 05/ 2012 for symptomatic polymorphic VT;  generator change 12/ 2016   Dyslipidemia    Early onset Alzheimer's dementia without behavioral disturbance [facility])    neurologist-- dr d. Bjorn Loser   Elevated troponin 05/11/2022   Foley catheter in place    History of gout    remote   History of pulmonary embolism 2011   per pt wife no previous blood clots prior to and none since 2011   Moderate tricuspid regurgitation    per last echo in care everywhere 05-18-2020  moderate to severe TR , RSVP 37-74mmHg   Persistent atrial fibrillation ([place])    approx late 2011 to 01/ 2012 dx AFib  s/p DCCV 01/ 2012 unsuccessful   Presence of permanent cardiac pacemaker    Sepsis ([place]) 05/10/2022   Septic shock ([place]) 05/11/2022   Sigmoid diverticulosis    SSS (sick sinus syndrome) ([place]) 01/2011   03/ 2012 s/p  permanent pacemaker  then upgraded to dual ICD 05/ 2012 due to symptomatic VT   Urine retention 06/2021   has foley catheter   Ventricular tachycardia,  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   pt had recurrent syncopal episode, 03-29-2011 pacemaker intergatted in office baseline Afib and runs of NSVT which correlated with syncopal episodes  s/p removal pacemake and placement dual ICD    Past Surgical History:  Procedure Laterality Date   CARDIAC CATHETERIZATION  01/31/2011   '@[facility]'$ ;  no intracardiac shunting identified, mild volume expansion, normal pulmonary artery pressure, and adequate cardiac output   CARDIAC PACEMAKER PLACEMENT  01/2011   CARDIOVERSION  11/2010   unsuccessful   CATARACT EXTRACTION W/ INTRAOCULAR LENS IMPLANT Bilateral 2021   CYSTOSCOPY W/ URETERAL STENT PLACEMENT Bilateral 05/10/2022   Procedure: CYSTOSCOPY WITH RETROGRADE PYELOGRAM/URETERAL STENT PLACEMENT;  Surgeon: Lucas Mallow, MD;  Location: [facility];  Service: Urology;  Laterality: Bilateral;   EP IMPLANTABLE DEVICE  04/04/2011   '@[facility]'$ ;  by dr m. drucker---  EP study with inducible polymorphic VT,  DDD pacemaker removed and  Implanted Dual ICD device  (St Jude)   ICD GENERATOR CHANGE  11/10/2015   '@[facility]'$    TONSILLECTOMY  1952   age 73   TRANSURETHRAL RESECTION OF PROSTATE N/A 09/17/2021   Procedure: TRANSURETHRAL RESECTION OF THE PROSTATE (TURP), BIPOLAR, BLADDER BIOPSY;  Surgeon: Janith Lima, MD;  Location: [facility];  Service: Urology;  Laterality: N/A;    Allergies:  Allergies  Allergen Reactions   Nsaids Other (See Comments)    Cannot take due to defibrillator/pacemaker   Saw [place] Diarrhea   Vytorin [Ezetimibe-Simvastatin] Other (See Comments)    Myalgias    Adhesive [Tape] Rash    Family History  Problem Relation Age of Onset   Diabetes Mellitus II Other     Social History:  reports that he has never smoked. He has never used smokeless tobacco. He reports that he does not currently use alcohol. He reports that he does not use drugs.  ROS: A complete review of systems was performed.  All systems are negative except for pertinent  findings as noted.  Physical Exam:  Vital signs in last 24 hours: Temp:  [97.9 F (36.6 C)] 97.9 F (36.6 C) (08/11 0616) Pulse Rate:  [60] 60 (08/11 0616) Resp:  [16] 16 (08/11 0616)  BP: (110)/(79) 110/79 (08/11 0616) SpO2:  [100 %] 100 % (08/11 0616) Weight:  [68 kg] 68 kg (08/11 0646) Constitutional:  Alert and oriented, No acute distress Cardiovascular: Regular rate and rhythm Respiratory: Normal respiratory effort, Lungs clear bilaterally GI: Abdomen is soft, nontender, nondistended, no abdominal masses GU: No CVA tenderness Lymphatic: No lymphadenopathy Neurologic: Grossly intact, no focal deficits Psychiatric: Normal mood and affect  Laboratory Data:  No results for input(s): "WBC", "HGB", "HCT", "PLT" in the last 72 hours.  No results for input(s): "NA", "K", "CL", "GLUCOSE", "BUN", "CALCIUM", "CREATININE" in the last 72 hours.  Invalid input(s): "CO3"   No results found for this or any previous visit (from the past 24 hour(s)). Recent Results (from the past 240 hour(s))  Surgical pcr screen     Status: None   Collection Time: 06/21/22 12:40 PM   Specimen: Nasal Mucosa; Nasal Swab  Result Value Ref Range Status   MRSA, PCR NEGATIVE NEGATIVE Final   Staphylococcus aureus NEGATIVE NEGATIVE Final    Comment: (NOTE) The Xpert SA Assay (FDA approved for NASAL specimens in patients 40 years of age and older), is one component of a comprehensive surveillance program. It is not intended to diagnose infection nor to guide or monitor treatment. Performed at [facility], [place] [address] [place], [place] [address]     Renal Function: No results for input(s): "CREATININE" in the last 168 hours. Estimated Creatinine Clearance: 41.2 mL/min (A) (by C-G formula based on SCr of 1.42 mg/dL (H)).  Radiologic Imaging: No results found.  I independently reviewed the above imaging studies.  Assessment and Plan Eric Krueger is a 76 y.o. male with  bilateral ureteral stones s/p bilateral ureteral stents by Dr. Gloriann Loan on 05/10/2022 and urinary retention currently managed with indwelling foley. Ucx 06/15/2022 with >100K E coli. He was treated with IV abx and transitioned to cefdinir for one week following. He has been on bactrim prophylactic dose prior to surgery today.  -The risks, benefits and alternatives of cystoscopy with bilateral ureteroscopy with laser lithotripsy and basket extraction of stones, suprapubic tube placement, bilateral JJ stent placement was discussed with the patient.  Risks include, but are not limited to: bleeding, urinary tract infection, ureteral injury, ureteral stricture disease, chronic pain, urinary symptoms, bladder injury, stent migration, the need for nephrostomy tube placement, MI, CVA, DVT, PE and the inherent risks with general anesthesia.  The patient voices understanding and wishes to proceed.    Matt R. Juelz Claar MD 07/01/2022, 7:23 AM  [facility] Pager: [phone]): [phone]

## 2022-07-01 NOTE — Discharge Instructions (Signed)
[facility] °[phone] °Post Ureteroscopy With or Without Stent Instructions ° °Definitions: ° °Ureter: The duct that transports urine from the kidney to the bladder. °Stent:   A plastic hollow tube that is placed into the ureter, from the kidney to the                 bladder to prevent the ureter from swelling shut. ° °GENERAL INSTRUCTIONS: ° °Despite the fact that no skin incisions were used, the area around the ureter and bladder is raw and irritated. The stent is a foreign body which will further irritate the bladder wall. This irritation is manifested by increased frequency of urination, both day and night, and by an increase in the urge to urinate. In some, the urge to urinate is present almost always. Sometimes the urge is strong enough that you may not be able to stop yourself from urinating. The only real cure is to remove the stent and then give time for the bladder wall to heal which can't be done until the danger of the ureter swelling shut has passed, which varies. ° °You may see some blood in your urine while the stent is in place and a few days afterwards. Do not be alarmed, even if the urine was clear for a while. Get off your feet and drink lots of fluids until clearing occurs. If you start to pass clots or [place]'t improve, call us. ° °DIET: °You may return to your normal diet immediately. Because of the raw surface of your bladder, alcohol, spicy foods, acid type foods and drinks with caffeine may cause irritation or frequency and should be used in moderation. To keep your urine flowing freely and to avoid constipation, drink plenty of fluids during the day ( 8-10 glasses ). °Tip: Avoid cranberry juice because it is very acidic. ° °ACTIVITY: °Your physical activity doesn't need to be restricted. However, if you are very active, you may see some blood in your urine. We suggest that you reduce your activity under these circumstances until the bleeding has stopped. ° °BOWELS: °It is  important to keep your bowels regular during the postoperative period. Straining with bowel movements can cause bleeding. A bowel movement every other day is reasonable. Use a mild laxative if needed, such as Milk of Magnesia 2-3 tablespoons, or 2 Dulcolax tablets. Call if you continue to have problems. If you have been taking narcotics for pain, before, during or after your surgery, you may be constipated. Take a laxative if necessary. ° ° °MEDICATION: °You should resume your pre-surgery medications unless told not to. In addition you will often be given an antibiotic to prevent infection. These should be taken as prescribed until the bottles are finished unless you are having an unusual reaction to one of the drugs. ° °PROBLEMS YOU SHOULD REPORT TO US: °· Fevers over 100.5 Fahrenheit. °· Heavy bleeding, or clots ( See above notes about blood in urine ). °· Inability to urinate. °· Drug reactions ( hives, rash, nausea, vomiting, diarrhea ). °· Severe burning or pain with urination that is not improving. ° °FOLLOW-UP: °You will need a follow-up appointment to monitor your progress. Call for this appointment at the number listed above. Usually the first appointment will be about three to fourteen days after your surgery. ° ° ° ° ° °

## 2022-07-01 NOTE — Anesthesia Procedure Notes (Signed)
Procedure Name: LMA Insertion Date/Time: 07/01/2022 7:37 AM  Performed by: Lind Covert, CRNAPre-anesthesia Checklist: Patient identified, Emergency Drugs available, Suction available, Patient being monitored and Timeout performed Patient Re-evaluated:Patient Re-evaluated prior to induction Oxygen Delivery Method: Circle system utilized Preoxygenation: Pre-oxygenation with 100% oxygen Induction Type: IV induction LMA: LMA inserted LMA Size: 5.0 Tube type: Oral Number of attempts: 1 Placement Confirmation: breath sounds checked- equal and bilateral and positive ETCO2 Tube secured with: Tape Dental Injury: Teeth and Oropharynx as per pre-operative assessment

## 2022-07-01 NOTE — Anesthesia Postprocedure Evaluation (Signed)
Anesthesia Post Note  Patient: Eric Krueger  Procedure(s) Performed: CYSTOSCOPY/ RETROGRADE/URETEROSCOPY/HOLMIUM LASER/STENT PLACEMENT (Bilateral) INSERTION OF SUPRAPUBIC CATHETER     Patient location during evaluation: PACU Anesthesia Type: General Level of consciousness: awake and alert, oriented and patient cooperative Pain management: pain level controlled Vital Signs Assessment: post-procedure vital signs reviewed and stable Respiratory status: spontaneous breathing, nonlabored ventilation and respiratory function stable Cardiovascular status: blood pressure returned to baseline and stable Postop Assessment: no apparent nausea or vomiting Anesthetic complications: no   No notable events documented.  Last Vitals:  Vitals:   07/01/22 0945 07/01/22 1015  BP: (!) 118/105 118/68  Pulse: (!) 59   Resp: 15   Temp: 36.4 C   SpO2: 99% 99%    Last Pain:  Vitals:   07/01/22 1015  TempSrc:   PainSc: 0-No pain                 [name]

## 2022-07-01 NOTE — Anesthesia Preprocedure Evaluation (Addendum)
Anesthesia Evaluation  Patient identified by MRN, date of birth, ID band Patient confused  General Assessment Comment:Advanced dementia, minimally responsive in preop- family at bedside   Reviewed: Allergy & Precautions, NPO status , Patient's Chart, lab work & pertinent test results  Airway Mallampati: II  TM Distance: >3 FB Neck ROM: Full    Dental  (+) Teeth Intact, Dental Advisory Given   Pulmonary PE (2011)   Pulmonary exam normal breath sounds clear to auscultation       Cardiovascular +CHF (LVEF 45-50%, moderate RV dysfunction)  Normal cardiovascular exam+ dysrhythmias (eliquis last dose 06/28/22) Atrial Fibrillation + 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 Left ventricular ejection fraction, by estimation, is 45 to 50%. The  left ventricle has mildly decreased function. The left ventricle  demonstrates regional wall motion abnormalities (see scoring  diagram/findings for description). There is mild left  ventricular hypertrophy. Left ventricular diastolic parameters are  indeterminate. There is severe hypokinesis of the left ventricular,  mid-apical apical segment and anterior segment.  2. Right ventricular systolic function is moderately reduced. The right  ventricular size is mildly enlarged.  3. Left atrial size was severely dilated.  4. Right atrial size was moderately dilated.  5. The mitral valve is normal in structure. Mild mitral valve  regurgitation.  6. Tricuspid valve regurgitation is moderate.  7. The aortic valve is normal in structure. Aortic valve regurgitation is  trivial. No aortic stenosis is present.    Midodrine    Neuro/Psych PSYCHIATRIC DISORDERS Dementia negative neurological ROS     GI/Hepatic negative GI ROS, Neg liver ROS,   Endo/Other   negative endocrine ROS  Renal/GU Renal InsufficiencyRenal diseaseCr 1.42, CKD 3  negative genitourinary   Musculoskeletal  (+) Arthritis , Osteoarthritis,    Abdominal   Peds negative pediatric ROS (+)  Hematology  (+) Blood dyscrasia, anemia , Hb 8.3, plt 286   Anesthesia Other Findings   Reproductive/Obstetrics negative OB ROS                            Anesthesia Physical Anesthesia Plan  ASA: 3  Anesthesia Plan: General   Post-op Pain Management: Tylenol PO (pre-op)*   Induction: Intravenous  PONV Risk Score and Plan: Ondansetron, Dexamethasone and Treatment may vary due to age or medical condition  Airway Management Planned: LMA  Additional Equipment: None  Intra-op Plan:   Post-operative Plan: Extubation in OR  Informed Consent: I have reviewed the patients History and Physical, chart, labs and discussed the procedure including the risks, benefits and alternatives for the proposed anesthesia with the patient or authorized representative who has indicated his/her understanding and acceptance.   Patient has DNR.  Discussed DNR with power of attorney and Continue DNR.   Dental advisory given and Consent reviewed with POA  Plan Discussed with: CRNA  Anesthesia Plan Comments: (D/w family at bedside DNR status intraop- would like to continue to withhold chest compressions and defibrillation, however they are okay with temporary respiratory support and temporary pressors.   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

## 2022-07-02 ENCOUNTER — Encounter (HOSPITAL_COMMUNITY): Payer: Self-pay | Admitting: Urology

## 2022-08-03 ENCOUNTER — Telehealth: Payer: Self-pay | Admitting: Oncology

## 2022-08-03 NOTE — Telephone Encounter (Signed)
Per 9/13 phone line pt called to r/s appointment  date picked by pt

## 2022-08-09 ENCOUNTER — Telehealth: Payer: 59 | Admitting: Oncology

## 2022-09-15 ENCOUNTER — Telehealth: Payer: 59 | Admitting: Oncology

## 2022-09-21 DEATH — deceased

## 2023-12-15 IMAGING — CT CT HEAD W/O CM
3 series · 15 of 47 positions shown, 18 images · non-contrast
Comparison: None Available.

CLINICAL DATA: Altered mental status



[Series 2: head wo · axial · 0.44mm/px · z∈[-42,+108]mm · 9 of 36 slices shown, 12 images]
[im 3/36  brain]
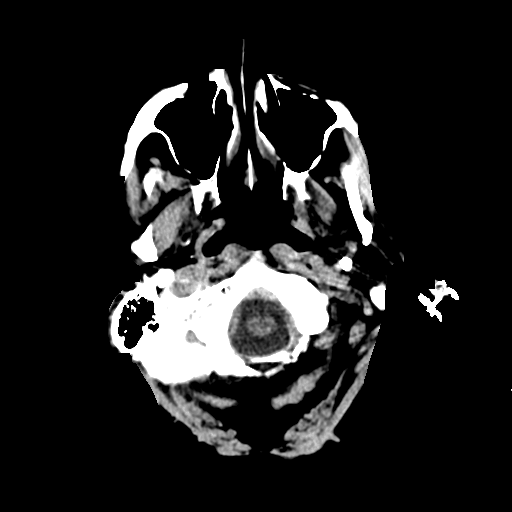
[im 3/36  bone]
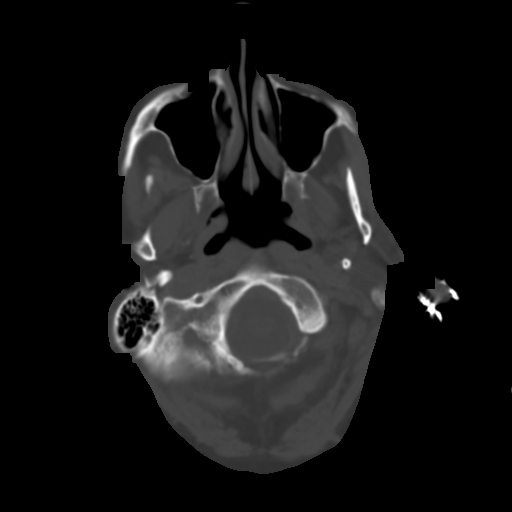
[im 7/36  brain]
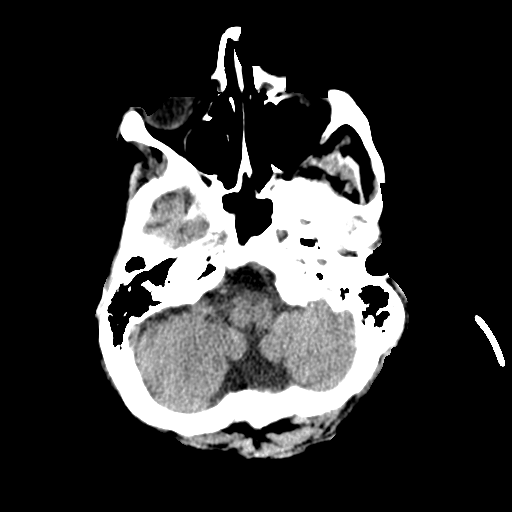
[im 10/36  brain]
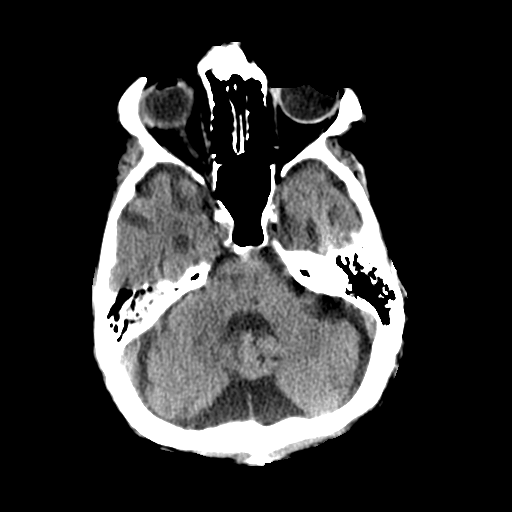
[im 14/36  brain]
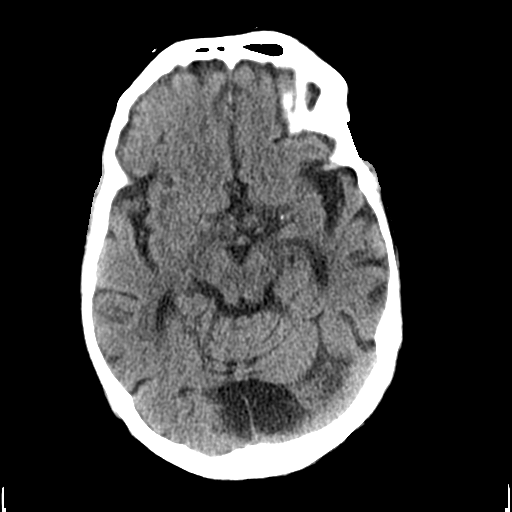
[im 19/36  brain]
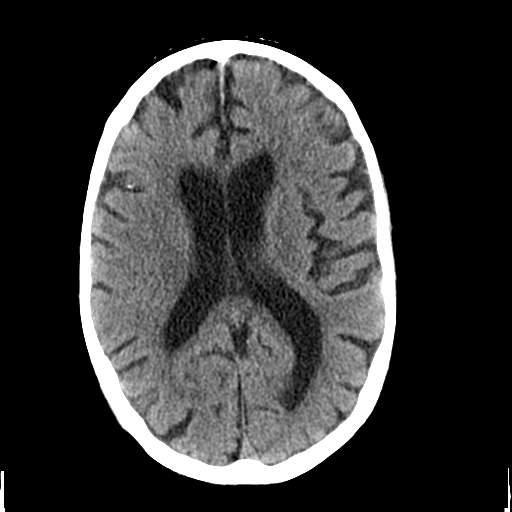
[im 19/36  bone]
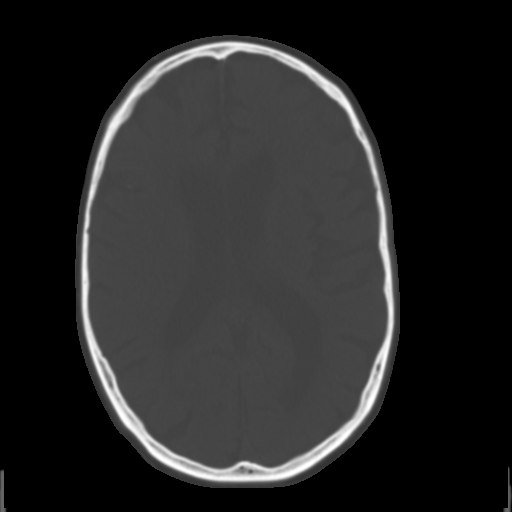
[im 22/36  brain]
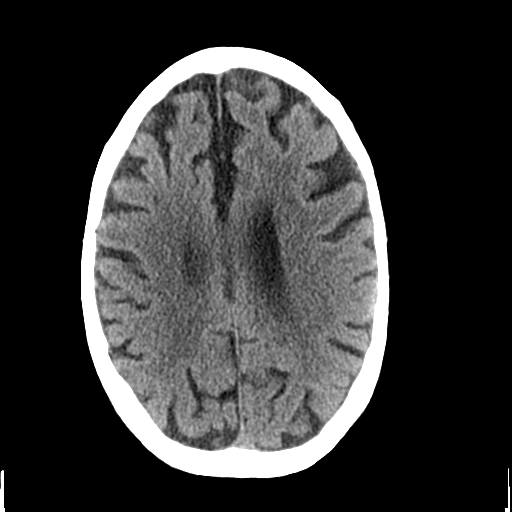
[im 26/36  brain]
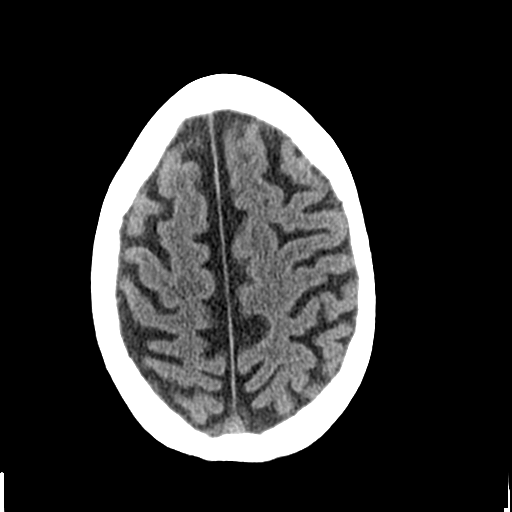
[im 29/36  brain]
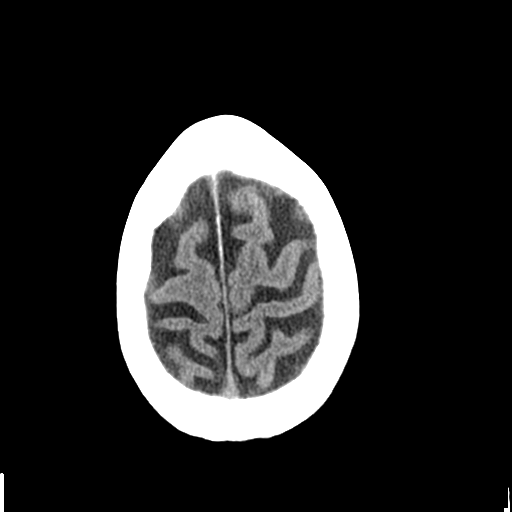
[im 33/36  brain]
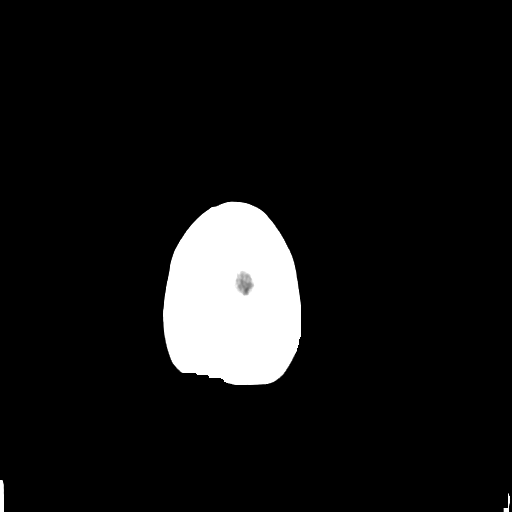
[im 33/36  bone]
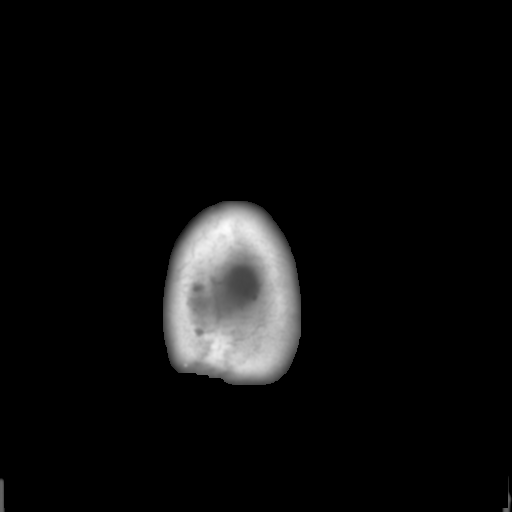

[Series 5: coronal soft tissue · coronal · 0.31mm/px · 3 of 70 slices shown]
[im 24/70  brain]
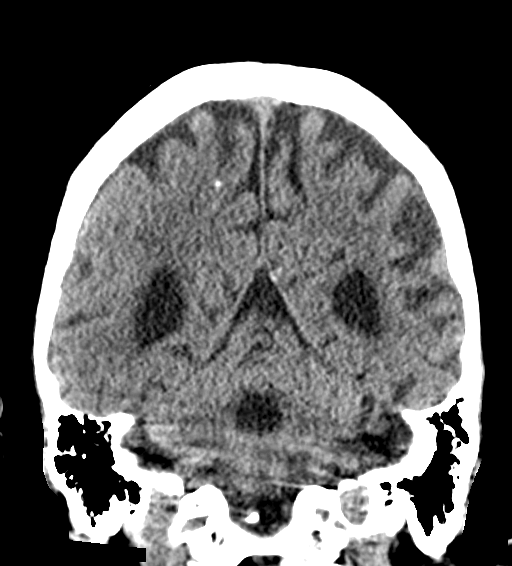
[im 31/70  brain]
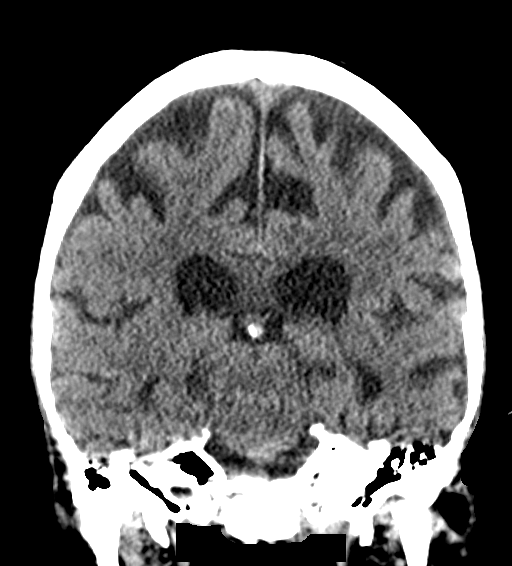
[im 39/70  brain]
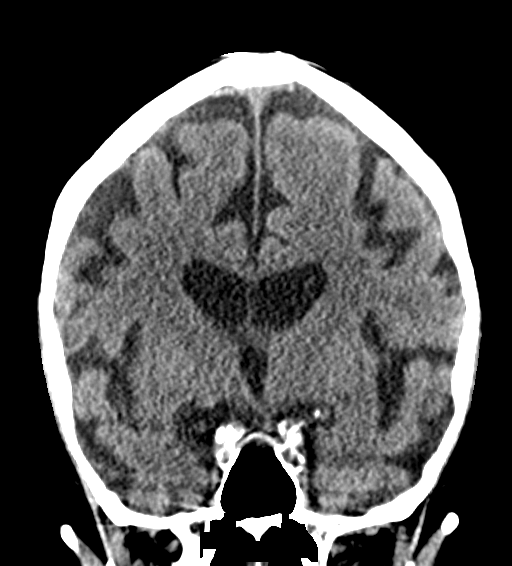

[Series 6: sagittal soft tissue · sagittal · 0.35mm/px · 3 of 53 slices shown]
[im 18/53  brain]
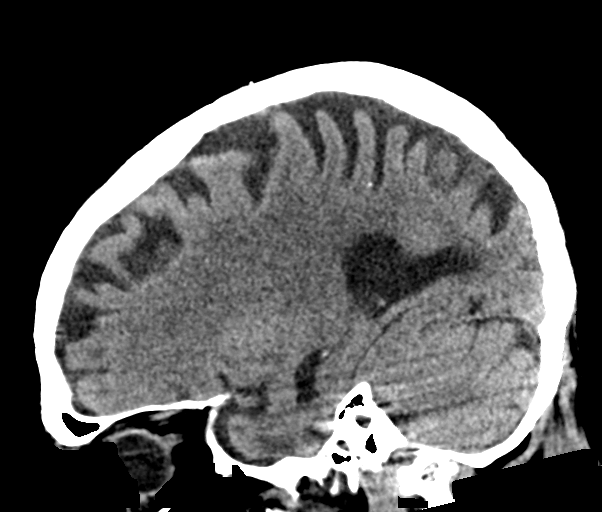
[im 27/53  brain]
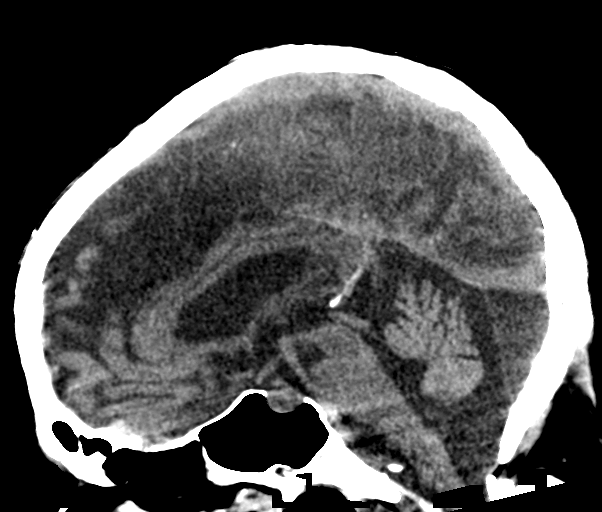
[im 35/53  brain]
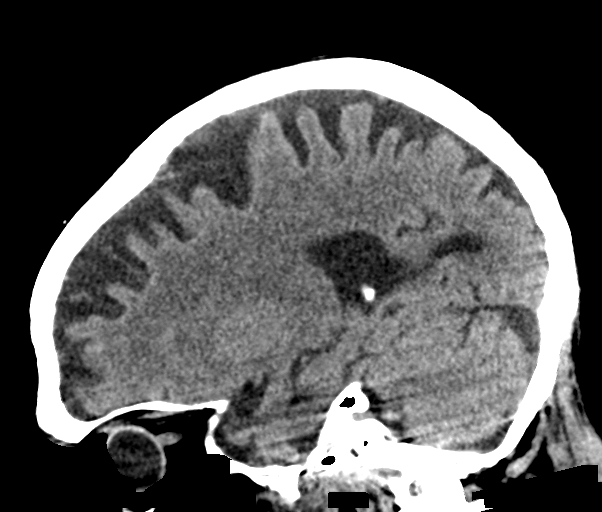

[15 of 47 positions shown; findings below may reference images not displayed]

FINDINGS: Brain: There is no acute intracranial hemorrhage, extra-axial fluid
collection, or acute infarct.

Parenchymal volume is within normal limits for age. The ventricles
are normal in size. Gray-white differentiation is preserved. There
is no mass lesion. There is no mass effect or midline shift.
Prominent CSF space in the posterior fossa likely reflecting a mega
cisterna magna is unchanged.

Vascular: There is calcification of the bilateral cavernous ICAs and
vertebral arteries. Additional calcification is seen in the right
sylvian fissure and over the right cerebral hemisphere which may
reflect atherosclerotic plaque or calcific emboli of indeterminate
age.

Skull: Normal. Negative for fracture or focal lesion.

Sinuses/Orbits: The imaged paranasal sinuses are clear. Bilateral
lens implants are in place. The globes and orbits are otherwise
unremarkable.

Other: None.
IMPRESSION: 1. No evidence of acute intracranial pathology.
2. Calcification in the right sylvian fissure and overlying the
right cerebral hemisphere may reflect atherosclerotic plaque or
calcific emboli of indeterminate age. If there is clinical concern
for infarct, consider MRI or CTA for further evaluation.
